# Patient Record
Sex: Female | Born: 1960 | Race: White | Hispanic: No | Marital: Married | Smoking: Never smoker
Health system: Southern US, Community
[De-identification: ages and names within clinical notes are randomized; demographics above are authoritative.]

## PROBLEM LIST (undated history)

## (undated) DIAGNOSIS — E785 Hyperlipidemia, unspecified: Secondary | ICD-10-CM

## (undated) DIAGNOSIS — F32A Depression, unspecified: Secondary | ICD-10-CM

## (undated) DIAGNOSIS — F329 Major depressive disorder, single episode, unspecified: Secondary | ICD-10-CM

## (undated) DIAGNOSIS — F419 Anxiety disorder, unspecified: Secondary | ICD-10-CM

## (undated) DIAGNOSIS — J45909 Unspecified asthma, uncomplicated: Secondary | ICD-10-CM

## (undated) DIAGNOSIS — E079 Disorder of thyroid, unspecified: Secondary | ICD-10-CM

## (undated) DIAGNOSIS — M8580 Other specified disorders of bone density and structure, unspecified site: Secondary | ICD-10-CM

## (undated) DIAGNOSIS — K219 Gastro-esophageal reflux disease without esophagitis: Secondary | ICD-10-CM

## (undated) HISTORY — DX: Depression, unspecified: F32.A

## (undated) HISTORY — DX: Disorder of thyroid, unspecified: E07.9

## (undated) HISTORY — DX: Hyperlipidemia, unspecified: E78.5

## (undated) HISTORY — PX: TUBAL LIGATION: SHX77

## (undated) HISTORY — DX: Anxiety disorder, unspecified: F41.9

## (undated) HISTORY — DX: Major depressive disorder, single episode, unspecified: F32.9

## (undated) HISTORY — DX: Other specified disorders of bone density and structure, unspecified site: M85.80

## (undated) HISTORY — DX: Gastro-esophageal reflux disease without esophagitis: K21.9

---

## 2009-04-27 ENCOUNTER — Ambulatory Visit: Payer: Self-pay | Admitting: Unknown Physician Specialty

## 2009-04-27 LAB — HM COLONOSCOPY

## 2012-11-11 ENCOUNTER — Ambulatory Visit: Payer: Self-pay

## 2014-02-06 LAB — HM PAP SMEAR

## 2014-03-29 ENCOUNTER — Ambulatory Visit: Payer: Self-pay

## 2014-04-20 ENCOUNTER — Ambulatory Visit: Payer: Self-pay

## 2014-04-20 LAB — HM MAMMOGRAPHY

## 2014-08-17 ENCOUNTER — Other Ambulatory Visit: Payer: Self-pay | Admitting: Unknown Physician Specialty

## 2014-08-18 ENCOUNTER — Other Ambulatory Visit: Payer: Self-pay

## 2014-08-18 MED ORDER — SIMVASTATIN 20 MG PO TABS: 20.0000 mg | ORAL_TABLET | Freq: Every day | ORAL | Status: DC

## 2014-08-18 NOTE — Telephone Encounter (Signed)
Patient was last seen on 05/03/14 with a f/u PE in 6 months. Practice partner number is [id number] and pharmacy is [facility].

## 2014-10-06 ENCOUNTER — Other Ambulatory Visit: Payer: Self-pay | Admitting: Unknown Physician Specialty

## 2014-10-06 NOTE — Telephone Encounter (Signed)
Needs seen further refills 

## 2014-11-06 ENCOUNTER — Encounter: Payer: Self-pay | Admitting: Unknown Physician Specialty

## 2014-11-06 ENCOUNTER — Ambulatory Visit (INDEPENDENT_AMBULATORY_CARE_PROVIDER_SITE_OTHER): Payer: 59 | Admitting: Unknown Physician Specialty

## 2014-11-06 VITALS — BP 118/80 | HR 73 | Temp 98.7°F | Ht <= 58 in | Wt 141.4 lb

## 2014-11-06 DIAGNOSIS — F419 Anxiety disorder, unspecified: Secondary | ICD-10-CM

## 2014-11-06 DIAGNOSIS — F418 Other specified anxiety disorders: Secondary | ICD-10-CM | POA: Diagnosis not present

## 2014-11-06 DIAGNOSIS — E039 Hypothyroidism, unspecified: Secondary | ICD-10-CM | POA: Insufficient documentation

## 2014-11-06 DIAGNOSIS — K219 Gastro-esophageal reflux disease without esophagitis: Secondary | ICD-10-CM

## 2014-11-06 DIAGNOSIS — I10 Essential (primary) hypertension: Secondary | ICD-10-CM

## 2014-11-06 DIAGNOSIS — E785 Hyperlipidemia, unspecified: Secondary | ICD-10-CM | POA: Diagnosis not present

## 2014-11-06 DIAGNOSIS — F329 Major depressive disorder, single episode, unspecified: Secondary | ICD-10-CM | POA: Insufficient documentation

## 2014-11-06 DIAGNOSIS — E669 Obesity, unspecified: Secondary | ICD-10-CM

## 2014-11-06 DIAGNOSIS — M8580 Other specified disorders of bone density and structure, unspecified site: Secondary | ICD-10-CM | POA: Insufficient documentation

## 2014-11-06 DIAGNOSIS — E1169 Type 2 diabetes mellitus with other specified complication: Secondary | ICD-10-CM | POA: Insufficient documentation

## 2014-11-06 DIAGNOSIS — F32A Depression, unspecified: Secondary | ICD-10-CM

## 2014-11-06 DIAGNOSIS — J45909 Unspecified asthma, uncomplicated: Secondary | ICD-10-CM

## 2014-11-06 LAB — LIPID PANEL PICCOLO, WAIVED
Chol/HDL Ratio Piccolo,Waive: 3.9 mg/dL
Cholesterol Piccolo, Waived: 226 mg/dL — ABNORMAL HIGH (ref <200)
HDL Chol Piccolo, Waived: 58 mg/dL (ref >59)
LDL Chol Calc Piccolo Waived: 129 mg/dL — ABNORMAL HIGH (ref <100)
Triglycerides Piccolo,Waived: 197 mg/dL — ABNORMAL HIGH (ref <150)
VLDL Chol Calc Piccolo,Waive: 39 mg/dL — ABNORMAL HIGH (ref <30)

## 2014-11-06 NOTE — Progress Notes (Signed)
   BP 118/80 mmHg  Pulse 73  Temp(Src) 98.7 F (37.1 C)  Ht 4' 9.6" (1.463 m)  Wt 141 lb 6.4 oz (64.139 kg)  BMI 29.97 kg/m2  SpO2 97%  LMP 06/14/2014 (Approximate)   Subjective:    Patient ID: Denise Proctor, female    DOB: 08/29/1960, 54 y.o.   MRN: [id number]  HPI: [name] is a 54 y.o. female  Chief Complaint  Patient presents with  . OTHER    pt has paperwork for job  . Medication Problem    pt states she thinks the citalopram is making her gain weight    Relevant past medical, surgical, family and social history reviewed and updated as indicated. Interim medical history since our last visit reviewed. Allergies and medications reviewed and updated.  Review of Systems  Per HPI unless specifically indicated above     Objective:    BP 118/80 mmHg  Pulse 73  Temp(Src) 98.7 F (37.1 C)  Ht 4' 9.6" (1.463 m)  Wt 141 lb 6.4 oz (64.139 kg)  BMI 29.97 kg/m2  SpO2 97%  LMP 06/14/2014 (Approximate)  Wt Readings from Last 3 Encounters:  11/06/14 141 lb 6.4 oz (64.139 kg)  05/03/14 140 lb (63.504 kg)    Physical Exam  Results for orders placed or performed in visit on 11/06/14  HM MAMMOGRAPHY  Result Value Ref Range   HM Mammogram from PP   HM PAP SMEAR  Result Value Ref Range   HM Pap smear from PP   HM COLONOSCOPY  Result Value Ref Range   HM Colonoscopy from PP       Assessment & Plan:   Problem List Items Addressed This Visit    None       Follow up plan: No Follow-up on file.

## 2014-11-06 NOTE — Progress Notes (Signed)
BP 118/80 mmHg  Pulse 73  Temp(Src) 98.7 F (37.1 C)  Ht 4' 9.6" (1.463 m)  Wt 141 lb 6.4 oz (64.139 kg)  BMI 29.97 kg/m2  SpO2 97%  LMP 06/14/2014 (Approximate)   Subjective:    Patient ID: Denise Proctor, female    DOB: [birth year], 54 y.o.   MRN: [id number]  HPI: Denise Proctor is a 54 y.o. female  Chief Complaint  Patient presents with  . OTHER    pt has paperwork for job  . Medication Problem    pt states she thinks the citalopram is making her gain weight    HYPERTENSION / HYPERLIPIDEMIA Patient is requesting refill(s). Satisfied with current treatment?  yes  H6  Duration of hypertension:  chronic  H4  BP monitoring frequency:  not checking.  H5  Past BP meds:  none  P1  Duration of hyperlipidemia:  chronic  H4  Cholesterol medication side effects:  no P1  Cholesterol supplements:  none  P1 Past cholesterol medications:  none  P1 Medication compliance:  excellent compliance  P1  Aspirin:  Aspirin Therapy  on 02/08/10 Recent stressors:  no  H6   Recurrent headaches:  no  R10 Visual changes:  no  R2  Palpitations:  no  R4  Dyspnea:  no  R5  Chest pain:  no  R4  Lower extremity edema:  no  R4  Dizzy/lightheaded:  no  R10   Depression Doing well.  Feels that Citalopram is causing weight gain.   Depression screen PHQ 2/9 11/06/2014  Decreased Interest 0  Down, Depressed, Hopeless 0  PHQ - 2 Score 0      Relevant past medical, surgical, family and social history reviewed and updated as indicated. Interim medical history since our last visit reviewed. Allergies and medications reviewed and updated.  Review of Systems  Per HPI unless specifically indicated above     Objective:    BP 118/80 mmHg  Pulse 73  Temp(Src) 98.7 F (37.1 C)  Ht 4' 9.6" (1.463 m)  Wt 141 lb 6.4 oz (64.139 kg)  BMI 29.97 kg/m2  SpO2 97%  LMP 06/14/2014 (Approximate)  Wt Readings from Last 3 Encounters:  11/06/14 141 lb 6.4 oz (64.139 kg)  05/03/14 140 lb (63.504 kg)     Physical Exam  Constitutional: She is oriented to person, place, and time. She appears well-developed and well-nourished. No distress.  HENT:  Head: Normocephalic and atraumatic.  Eyes: Conjunctivae and lids are normal. Right eye exhibits no discharge. Left eye exhibits no discharge. No scleral icterus.  Cardiovascular: Normal rate, regular rhythm and normal heart sounds.   Pulmonary/Chest: Effort normal and breath sounds normal. No respiratory distress.  Abdominal: Normal appearance. There is no splenomegaly or hepatomegaly.  Musculoskeletal: Normal range of motion.  Neurological: She is alert and oriented to person, place, and time.  Skin: Skin is intact. No rash noted. No pallor.  Psychiatric: She has a normal mood and affect. Her behavior is normal. Judgment and thought content normal.    Results for orders placed or performed in visit on 11/06/14  HM MAMMOGRAPHY  Result Value Ref Range   HM Mammogram from PP   HM PAP SMEAR  Result Value Ref Range   HM Pap smear from PP   HM COLONOSCOPY  Result Value Ref Range   HM Colonoscopy from PP       Assessment & Plan:   Problem List Items Addressed This Visit  Unprioritized   Anxiety and depression    Discuss weaning off of Citalopram      Hyperlipidemia    Check lipid panel      Relevant Orders   Comprehensive metabolic panel   Lipid Panel Piccolo, Waived   Hypertension - Primary    Stable, continue present medications.       Relevant Orders   Comprehensive metabolic panel   Obesity    Form from [facility].  Recommend losing 5% of body weight (7 pounds) in 6 months.  Discussed diet and exercise          Follow up plan: Return for physical in January.

## 2014-11-06 NOTE — Assessment & Plan Note (Signed)
Discuss weaning off of Citalopram

## 2014-11-06 NOTE — Assessment & Plan Note (Addendum)
Form from [facility].  Recommend losing 5% of body weight (7 pounds) in 6 months.  Discussed diet and exercise

## 2014-11-06 NOTE — Assessment & Plan Note (Signed)
Stable, continue present medications.   

## 2014-11-06 NOTE — Assessment & Plan Note (Signed)
Check lipid panel  

## 2014-11-07 LAB — COMPREHENSIVE METABOLIC PANEL
ALT: 21 IU/L (ref 0–32)
AST: 19 IU/L (ref 0–40)
Albumin/Globulin Ratio: 1.4 (ref 1.1–2.5)
Albumin: 4.3 g/dL (ref 3.5–5.5)
Alkaline Phosphatase: 51 IU/L (ref 39–117)
BUN/Creatinine Ratio: 12 (ref 9–23)
BUN: 8 mg/dL (ref 6–24)
Bilirubin Total: 0.6 mg/dL (ref 0.0–1.2)
CO2: 26 mmol/L (ref 18–29)
Calcium: 9.5 mg/dL (ref 8.7–10.2)
Chloride: 98 mmol/L (ref 97–108)
Creatinine, Ser: 0.69 mg/dL (ref 0.57–1.00)
GFR calc Af Amer: 113 mL/min/{1.73_m2} (ref >59)
GFR calc non Af Amer: 98 mL/min/{1.73_m2} (ref >59)
Globulin, Total: 3.1 g/dL (ref 1.5–4.5)
Glucose: 86 mg/dL (ref 65–99)
Potassium: 4.5 mmol/L (ref 3.5–5.2)
Sodium: 140 mmol/L (ref 134–144)
Total Protein: 7.4 g/dL (ref 6.0–8.5)

## 2015-01-14 ENCOUNTER — Other Ambulatory Visit: Payer: Self-pay | Admitting: Unknown Physician Specialty

## 2015-04-11 ENCOUNTER — Other Ambulatory Visit: Payer: Self-pay | Admitting: Unknown Physician Specialty

## 2015-06-06 ENCOUNTER — Other Ambulatory Visit: Payer: Self-pay | Admitting: Unknown Physician Specialty

## 2015-06-07 NOTE — Telephone Encounter (Signed)
Called and left patient a voicemail asking for her to please return my call.  

## 2015-06-07 NOTE — Telephone Encounter (Signed)
Needs labs further refills 

## 2015-06-08 NOTE — Telephone Encounter (Signed)
Patient returned my call when I was out of the office yesterday and asked to be called back at [phone]. So I called the patient back and scheduled a physical for 06/29/15.

## 2015-06-29 ENCOUNTER — Encounter: Payer: Self-pay | Admitting: Unknown Physician Specialty

## 2015-06-29 ENCOUNTER — Ambulatory Visit (INDEPENDENT_AMBULATORY_CARE_PROVIDER_SITE_OTHER): Payer: Managed Care, Other (non HMO) | Admitting: Unknown Physician Specialty

## 2015-06-29 VITALS — BP 118/76 | HR 79 | Temp 98.5°F | Ht <= 58 in | Wt 140.0 lb

## 2015-06-29 DIAGNOSIS — F329 Major depressive disorder, single episode, unspecified: Secondary | ICD-10-CM

## 2015-06-29 DIAGNOSIS — J309 Allergic rhinitis, unspecified: Secondary | ICD-10-CM | POA: Diagnosis not present

## 2015-06-29 DIAGNOSIS — N952 Postmenopausal atrophic vaginitis: Secondary | ICD-10-CM

## 2015-06-29 DIAGNOSIS — K219 Gastro-esophageal reflux disease without esophagitis: Secondary | ICD-10-CM | POA: Diagnosis not present

## 2015-06-29 DIAGNOSIS — J45909 Unspecified asthma, uncomplicated: Secondary | ICD-10-CM

## 2015-06-29 DIAGNOSIS — E785 Hyperlipidemia, unspecified: Secondary | ICD-10-CM | POA: Diagnosis not present

## 2015-06-29 DIAGNOSIS — E039 Hypothyroidism, unspecified: Secondary | ICD-10-CM | POA: Diagnosis not present

## 2015-06-29 DIAGNOSIS — Z0000 Encounter for general adult medical examination without abnormal findings: Secondary | ICD-10-CM

## 2015-06-29 DIAGNOSIS — F418 Other specified anxiety disorders: Secondary | ICD-10-CM

## 2015-06-29 DIAGNOSIS — F32A Depression, unspecified: Secondary | ICD-10-CM

## 2015-06-29 DIAGNOSIS — F419 Anxiety disorder, unspecified: Secondary | ICD-10-CM

## 2015-06-29 MED ORDER — OMEPRAZOLE 20 MG PO CPDR: 20.0000 mg | DELAYED_RELEASE_CAPSULE | Freq: Every day | ORAL | Status: DC

## 2015-06-29 MED ORDER — MONTELUKAST SODIUM 10 MG PO TABS: 10.0000 mg | ORAL_TABLET | Freq: Every day | ORAL | Status: DC

## 2015-06-29 MED ORDER — CITALOPRAM HYDROBROMIDE 20 MG PO TABS: 20.0000 mg | ORAL_TABLET | Freq: Every day | ORAL | Status: DC

## 2015-06-29 MED ORDER — LEVOTHYROXINE SODIUM 50 MCG PO TABS: 50.0000 ug | ORAL_TABLET | Freq: Every day | ORAL | Status: DC

## 2015-06-29 MED ORDER — SIMVASTATIN 20 MG PO TABS: 20.0000 mg | ORAL_TABLET | Freq: Every day | ORAL | Status: DC

## 2015-06-29 MED ORDER — ALBUTEROL SULFATE HFA 108 (90 BASE) MCG/ACT IN AERS: 2.0000 | INHALATION_SPRAY | Freq: Four times a day (QID) | RESPIRATORY_TRACT | Status: DC | PRN

## 2015-06-29 NOTE — Assessment & Plan Note (Signed)
Add albuterol prn.  

## 2015-06-29 NOTE — Assessment & Plan Note (Signed)
Discussed using Replense

## 2015-06-29 NOTE — Assessment & Plan Note (Signed)
Check cholesterol.

## 2015-06-29 NOTE — Assessment & Plan Note (Signed)
Restart Synthroid

## 2015-06-29 NOTE — Progress Notes (Signed)
BP 118/76 mmHg  Pulse 79  Temp(Src) 98.5 F (36.9 C)  Ht  (1.448 m)  Wt 140 lb (63.504 kg)  BMI 30.29 kg/m2  SpO2 95%  LMP 01/16/2015 (Approximate)   Subjective:    Patient ID: Denise Proctor, female    DOB: Sep 10, 1960, 55 y.o.   MRN: [id number]  HPI: Denise Proctor is a 55 y.o. female  Chief Complaint  Patient presents with  . Annual Exam    pt states she would like vitamin D checked  . Medication Problem    pt states her symbitcort is not lasting long enough  . Labs Only    HIV and Hep C order entered   ASTHMA Takes Symbicort twice a day but finds she needs something in the middle of the day.  Does not have a rescue inhaler.  States she has phlegm "all the time" Night time symptoms: none Wheeze/SOB: yes ER visits since last visit:none Missed work or school:none Allergens:nothing known  Hypothyroid Ran out of meds for 2 weeks and hasn't had time to get refills   Hyperlipidemia Using medications without problems: No Muscle aches  Diet compliance: good Exercise: walking  Depression Depression screen [facility] 2/9 06/29/2015 11/06/2014  Decreased Interest 0 0  Down, Depressed, Hopeless 0 0  PHQ - 2 Score 0 0      Social History   Social History  . Marital Status: Married    Spouse Name: N/A  . Number of Children: N/A  . Years of Education: N/A   Occupational History  . Not on file.   Social History Main Topics  . Smoking status: Never Smoker   . Smokeless tobacco: Never Used  . Alcohol Use: No  . Drug Use: No  . Sexual Activity: Yes   Other Topics Concern  . Not on file   Social History Narrative   Family History  Problem Relation Age of Onset  . Hypertension Mother   . Hyperlipidemia Mother   . COPD Mother   . Heart disease Father   . Diabetes Maternal Grandmother   . Cancer Paternal Grandmother     stomach  . Stroke Paternal Grandfather   . Heart disease Paternal Grandfather   . Hyperlipidemia Brother   . Diverticulitis Brother   .  Heart disease Maternal Grandfather   . Diverticulitis Brother    Past Medical History  Diagnosis Date  . GERD (gastroesophageal reflux disease)   . Thyroid disease   . Osteopenia   . Depression   . Anxiety   . Hyperlipidemia    Past Surgical History  Procedure Laterality Date  . Tubal ligation        Relevant past medical, surgical, family and social history reviewed and updated as indicated. Interim medical history since our last visit reviewed. Allergies and medications reviewed and updated.  Review of Systems  Constitutional: Negative.   HENT: Negative.   Eyes: Negative.   Respiratory: Negative.   Cardiovascular: Negative.   Gastrointestinal: Negative.   Endocrine: Negative.   Genitourinary:       Vaginal dryness  Musculoskeletal: Negative.   Skin: Negative.   Allergic/Immunologic: Negative.   Neurological: Negative.   Hematological: Negative.   Psychiatric/Behavioral: Negative.     Per HPI unless specifically indicated above     Objective:    BP 118/76 mmHg  Pulse 79  Temp(Src) 98.5 F (36.9 C)  Ht  (1.448 m)  Wt 140 lb (63.504 kg)  BMI 30.29 kg/m2  SpO2  95%  LMP 01/16/2015 (Approximate)  Wt Readings from Last 3 Encounters:  06/29/15 140 lb (63.504 kg)  11/06/14 141 lb 6.4 oz (64.139 kg)  05/03/14 140 lb (63.504 kg)    Physical Exam  Constitutional: She is oriented to person, place, and time. She appears well-developed and well-nourished.  HENT:  Head: Normocephalic and atraumatic.  Eyes: Pupils are equal, round, and reactive to light. Right eye exhibits no discharge. Left eye exhibits no discharge. No scleral icterus.  Neck: Normal range of motion. Neck supple. Carotid bruit is not present. No thyromegaly present.  Cardiovascular: Normal rate, regular rhythm and normal heart sounds.  Exam reveals no gallop and no friction rub.   No murmur heard. Pulmonary/Chest: Effort normal and breath sounds normal. No respiratory distress. She has no  wheezes. She has no rales.  Abdominal: Soft. Bowel sounds are normal. There is no tenderness. There is no rebound.  Genitourinary: No breast swelling, tenderness or discharge.  Musculoskeletal: Normal range of motion.  Lymphadenopathy:    She has no cervical adenopathy.  Neurological: She is alert and oriented to person, place, and time.  Skin: Skin is warm, dry and intact. No rash noted.  Psychiatric: She has a normal mood and affect. Her speech is normal and behavior is normal. Judgment and thought content normal. Cognition and memory are normal.    Results for orders placed or performed in visit on 11/06/14  Comprehensive metabolic panel  Result Value Ref Range   Glucose 86 65 - 99 mg/dL   BUN 8 6 - 24 mg/dL   Creatinine, Ser 3.08 0.57 - 1.00 mg/dL   GFR calc non Af Amer 98 >59 mL/min/1.73   GFR calc Af Amer 113 >59 mL/min/1.73   BUN/Creatinine Ratio 12 9 - 23   Sodium 140 134 - 144 mmol/L   Potassium 4.5 3.5 - 5.2 mmol/L   Chloride 98 97 - 108 mmol/L   CO2 26 18 - 29 mmol/L   Calcium 9.5 8.7 - 10.2 mg/dL   Total Protein 7.4 6.0 - 8.5 g/dL   Albumin 4.3 3.5 - 5.5 g/dL   Globulin, Total 3.1 1.5 - 4.5 g/dL   Albumin/Globulin Ratio 1.4 1.1 - 2.5   Bilirubin Total 0.6 0.0 - 1.2 mg/dL   Alkaline Phosphatase 51 39 - 117 IU/L   AST 19 0 - 40 IU/L   ALT 21 0 - 32 IU/L  Lipid Panel Piccolo, Waived  Result Value Ref Range   Cholesterol Piccolo, Waived 226 (H) <200 mg/dL   HDL Chol Piccolo, Waived 58 >59 mg/dL   Triglycerides Piccolo,Waived 197 (H) <150 mg/dL   Chol/HDL Ratio Piccolo,Waive 3.9 mg/dL   LDL Chol Calc Piccolo Waived 129 (H) <100 mg/dL   VLDL Chol Calc Piccolo,Waive 39 (H) <30 mg/dL      Assessment & Plan:   Problem List Items Addressed This Visit      Unprioritized   Allergic rhinitis    Add singulair due to Asthma      Anxiety and depression    Stable, continue present medications.        Asthma    Add albuterol prn      Relevant Medications    albuterol (PROVENTIL HFA;VENTOLIN HFA) 108 (90 Base) MCG/ACT inhaler   montelukast (SINGULAIR) 10 MG tablet   Atrophic vaginitis    Discussed using Replense      GERD (gastroesophageal reflux disease)    stable      Relevant Medications   omeprazole (PRILOSEC) 20  MG capsule   Hyperlipidemia    Check cholesterol      Relevant Medications   simvastatin (ZOCOR) 20 MG tablet   Other Relevant Orders   Lipid Panel w/o Chol/HDL Ratio   Hypothyroidism    Restart Synthroid      Relevant Medications   levothyroxine (SYNTHROID, LEVOTHROID) 50 MCG tablet   Other Relevant Orders   TSH    Other Visit Diagnoses    Health care maintenance    -  Primary    Relevant Orders    Hepatitis C antibody    HIV antibody    Hepatitis C antibody    HIV antibody    CBC with Differential/Platelet    VITAMIN D 25 Hydroxy (Vit-D Deficiency, Fractures)        Follow up plan: Return in about 6 months (around 12/30/2015).

## 2015-06-29 NOTE — Assessment & Plan Note (Signed)
Add singulair due to Asthma

## 2015-06-29 NOTE — Assessment & Plan Note (Signed)
stable °

## 2015-06-29 NOTE — Assessment & Plan Note (Signed)
Stable, continue present medications.   

## 2015-06-30 LAB — TSH: TSH: 10.28 u[IU]/mL — ABNORMAL HIGH (ref 0.450–4.500)

## 2015-06-30 LAB — CBC WITH DIFFERENTIAL/PLATELET
Basophils Absolute: 0 10*3/uL (ref 0.0–0.2)
Basos: 0 %
EOS (ABSOLUTE): 0.1 10*3/uL (ref 0.0–0.4)
Eos: 2 %
Hematocrit: 40.9 % (ref 34.0–46.6)
Hemoglobin: 13.5 g/dL (ref 11.1–15.9)
Immature Grans (Abs): 0 10*3/uL (ref 0.0–0.1)
Immature Granulocytes: 0 %
Lymphocytes Absolute: 1.7 10*3/uL (ref 0.7–3.1)
Lymphs: 29 %
MCH: 29.8 pg (ref 26.6–33.0)
MCHC: 33 g/dL (ref 31.5–35.7)
MCV: 90 fL (ref 79–97)
Monocytes Absolute: 0.5 10*3/uL (ref 0.1–0.9)
Monocytes: 9 %
Neutrophils Absolute: 3.4 10*3/uL (ref 1.4–7.0)
Neutrophils: 60 %
Platelets: 429 10*3/uL — ABNORMAL HIGH (ref 150–379)
RBC: 4.53 x10E6/uL (ref 3.77–5.28)
RDW: 14.7 % (ref 12.3–15.4)
WBC: 5.7 10*3/uL (ref 3.4–10.8)

## 2015-06-30 LAB — LIPID PANEL W/O CHOL/HDL RATIO
Cholesterol, Total: 200 mg/dL — ABNORMAL HIGH (ref 100–199)
HDL: 54 mg/dL (ref >39)
LDL Calculated: 124 mg/dL — ABNORMAL HIGH (ref 0–99)
Triglycerides: 108 mg/dL (ref 0–149)
VLDL Cholesterol Cal: 22 mg/dL (ref 5–40)

## 2015-06-30 LAB — HEPATITIS C ANTIBODY: Hep C Virus Ab: <0.1 s/co ratio (ref 0.0–0.9)

## 2015-06-30 LAB — HIV ANTIBODY (ROUTINE TESTING W REFLEX): HIV Screen 4th Generation wRfx: NONREACTIVE

## 2015-06-30 LAB — VITAMIN D 25 HYDROXY (VIT D DEFICIENCY, FRACTURES): Vit D, 25-Hydroxy: 15 ng/mL — ABNORMAL LOW (ref 30.0–100.0)

## 2015-07-03 ENCOUNTER — Encounter: Payer: Self-pay | Admitting: Unknown Physician Specialty

## 2015-08-20 ENCOUNTER — Other Ambulatory Visit: Payer: Self-pay | Admitting: Unknown Physician Specialty

## 2015-09-25 ENCOUNTER — Other Ambulatory Visit: Payer: Self-pay | Admitting: Unknown Physician Specialty

## 2015-09-25 NOTE — Telephone Encounter (Signed)
Your patient 

## 2015-10-19 ENCOUNTER — Encounter: Payer: Self-pay | Admitting: Unknown Physician Specialty

## 2015-10-19 ENCOUNTER — Ambulatory Visit (INDEPENDENT_AMBULATORY_CARE_PROVIDER_SITE_OTHER): Payer: Managed Care, Other (non HMO) | Admitting: Unknown Physician Specialty

## 2015-10-19 VITALS — BP 129/82 | HR 88 | Temp 98.7°F | Ht <= 58 in | Wt 139.6 lb

## 2015-10-19 DIAGNOSIS — E039 Hypothyroidism, unspecified: Secondary | ICD-10-CM

## 2015-10-19 DIAGNOSIS — J309 Allergic rhinitis, unspecified: Secondary | ICD-10-CM

## 2015-10-19 DIAGNOSIS — E559 Vitamin D deficiency, unspecified: Secondary | ICD-10-CM

## 2015-10-19 MED ORDER — BUDESONIDE-FORMOTEROL FUMARATE 160-4.5 MCG/ACT IN AERO: 2.0000 | INHALATION_SPRAY | Freq: Two times a day (BID) | RESPIRATORY_TRACT | 3 refills | Status: DC

## 2015-10-19 MED ORDER — MONTELUKAST SODIUM 10 MG PO TABS: 10.0000 mg | ORAL_TABLET | Freq: Every day | ORAL | 3 refills | Status: DC

## 2015-10-19 MED ORDER — LEVOTHYROXINE SODIUM 50 MCG PO TABS: 50.0000 ug | ORAL_TABLET | Freq: Every day | ORAL | 0 refills | Status: DC

## 2015-10-19 NOTE — Progress Notes (Signed)
BP 129/82 (BP Location: Left Arm, Patient Position: Sitting, Cuff Size: Normal)   Pulse 88   Temp 98.7 F (37.1 C)   Ht 4' 9.8" (1.468 m)   Wt 139 lb 9.6 oz (63.3 kg)   LMP 01/18/2015 (Approximate)   SpO2 98%   BMI 29.38 kg/m    Subjective:    Patient ID: Denise Proctor, female    DOB: 1960-08-03, 55 y.o.   MRN: [id number]  HPI: Denise Proctor is a 55 y.o. female  Chief Complaint  Patient presents with  . Depression  . Hyperlipidemia  . Hypothyroidism  . Medication Refill    pt states she [facility]  . Cough    pt states she has keeps getting phlegm up. States this has been going on for about 6 months     Hypothyroid Follow up today for repeat TSH, which was elevated at last visit due to running out of synthroid.   Low Vitamin D Also re-checking Vit-D level, patient has been taking vitamin D supplementation since last visit. Unclear on the amount  Allergic rhinnitis Started on singular in May, reports phlegm had gotten somewhat better until she was unable to get singular refilled. Will re-order this today.     Relevant past medical, surgical, family and social history reviewed and updated as indicated. Interim medical history since our last visit reviewed. Allergies and medications reviewed and updated.  Review of Systems  Constitutional: Negative for fatigue and unexpected weight change.  Respiratory: Positive for cough.   Endocrine: Negative for cold intolerance.    Per HPI unless specifically indicated above     Objective:    BP 129/82 (BP Location: Left Arm, Patient Position: Sitting, Cuff Size: Normal)   Pulse 88   Temp 98.7 F (37.1 C)   Ht 4' 9.8" (1.468 m)   Wt 139 lb 9.6 oz (63.3 kg)   LMP 01/18/2015 (Approximate)   SpO2 98%   BMI 29.38 kg/m   Wt Readings from Last 3 Encounters:  10/19/15 139 lb 9.6 oz (63.3 kg)  06/29/15 140 lb (63.5 kg)  11/06/14 141 lb 6.4 oz (64.1 kg)    Physical Exam  Constitutional:  She appears well-developed and well-nourished. No distress.  HENT:  Head: Normocephalic.  Nose: Mucosal edema present.  Mouth/Throat: Oropharynx is clear and moist.  Cardiovascular: Normal rate, regular rhythm and normal heart sounds.   Pulmonary/Chest: Effort normal and breath sounds normal. No respiratory distress.  Skin: Skin is warm and dry.  Psychiatric: She has a normal mood and affect. Her behavior is normal. Judgment and thought content normal.    Results for orders placed or performed in visit on 06/29/15  Hepatitis C antibody  Result Value Ref Range   Hep C Virus Ab <0.1 0.0 - 0.9 s/co ratio  HIV antibody  Result Value Ref Range   HIV Screen 4th Generation wRfx Non Reactive Non Reactive  Lipid Panel w/o Chol/HDL Ratio  Result Value Ref Range   Cholesterol, Total 200 (H) 100 - 199 mg/dL   Triglycerides 409108 0 - 149 mg/dL   HDL 54 >81>39 mg/dL   VLDL Cholesterol Cal 22 5 - 40 mg/dL   LDL Calculated 191124 (H) 0 - 99 mg/dL  TSH  Result Value Ref Range   TSH 10.280 (H) 0.450 - 4.500 uIU/mL  CBC with Differential/Platelet  Result Value Ref Range   WBC 5.7 3.4 - 10.8 x10E3/uL   RBC 4.53 3.77 -  5.28 x10E6/uL   Hemoglobin 13.5 11.1 - 15.9 g/dL   Hematocrit 16.1 09.6 - 46.6 %   MCV 90 79 - 97 fL   MCH 29.8 26.6 - 33.0 pg   MCHC 33.0 31.5 - 35.7 g/dL   RDW 04.5 40.9 - 81.1 %   Platelets 429 (H) 150 - 379 x10E3/uL   Neutrophils 60 %   Lymphs 29 %   Monocytes 9 %   Eos 2 %   Basos 0 %   Neutrophils Absolute 3.4 1.4 - 7.0 x10E3/uL   Lymphocytes Absolute 1.7 0.7 - 3.1 x10E3/uL   Monocytes Absolute 0.5 0.1 - 0.9 x10E3/uL   EOS (ABSOLUTE) 0.1 0.0 - 0.4 x10E3/uL   Basophils Absolute 0.0 0.0 - 0.2 x10E3/uL   Immature Granulocytes 0 %   Immature Grans (Abs) 0.0 0.0 - 0.1 x10E3/uL  VITAMIN D 25 Hydroxy (Vit-D Deficiency, Fractures)  Result Value Ref Range   Vit D, 25-Hydroxy 15.0 (L) 30.0 - 100.0 ng/mL      Assessment & Plan:   Problem List Items Addressed This Visit       Respiratory   Allergic rhinitis    Restart Singulair        Endocrine   Hypothyroidism - Primary   Relevant Medications   levothyroxine (SYNTHROID, LEVOTHROID) 50 MCG tablet   Other Relevant Orders   TSH    Other Visit Diagnoses    Vitamin D deficiency       Relevant Orders   VITAMIN D 25 Hydroxy (Vit-D Deficiency, Fractures)       Follow up plan: Will re-check TSH and Vit D levels today, call with results next week.  Re-ordered singular and suggested OTC flonase for further symptom management.  Follow up for physical in 6 months.

## 2015-10-19 NOTE — Assessment & Plan Note (Signed)
Restart Singulair. 

## 2015-10-20 LAB — VITAMIN D 25 HYDROXY (VIT D DEFICIENCY, FRACTURES): Vit D, 25-Hydroxy: 24.4 ng/mL — ABNORMAL LOW (ref 30.0–100.0)

## 2015-10-20 LAB — TSH: TSH: 4.2 u[IU]/mL (ref 0.450–4.500)

## 2015-12-02 ENCOUNTER — Other Ambulatory Visit: Payer: Self-pay | Admitting: Unknown Physician Specialty

## 2015-12-15 ENCOUNTER — Other Ambulatory Visit: Payer: Self-pay | Admitting: Unknown Physician Specialty

## 2016-01-03 ENCOUNTER — Other Ambulatory Visit: Payer: Self-pay | Admitting: Unknown Physician Specialty

## 2016-01-03 NOTE — Telephone Encounter (Signed)
[facility]. Pt stated she is out of this medication. [facility] [place] Thanks.

## 2016-01-03 NOTE — Telephone Encounter (Signed)
  Spoke with pharmacy, patient's medication has to go through mail order.  Elnita Maxwellheryl, Can you please send patients medication to mail order.   [facility]

## 2016-01-04 MED ORDER — LEVOTHYROXINE SODIUM 50 MCG PO TABS: 50.0000 ug | ORAL_TABLET | Freq: Every day | ORAL | 0 refills | Status: DC

## 2016-03-01 ENCOUNTER — Other Ambulatory Visit: Payer: Self-pay | Admitting: Unknown Physician Specialty

## 2016-04-11 ENCOUNTER — Encounter: Payer: Self-pay | Admitting: Unknown Physician Specialty

## 2016-04-11 ENCOUNTER — Ambulatory Visit (INDEPENDENT_AMBULATORY_CARE_PROVIDER_SITE_OTHER): Payer: Managed Care, Other (non HMO) | Admitting: Unknown Physician Specialty

## 2016-04-11 DIAGNOSIS — J3081 Allergic rhinitis due to animal (cat) (dog) hair and dander: Secondary | ICD-10-CM | POA: Diagnosis not present

## 2016-04-11 DIAGNOSIS — J45909 Unspecified asthma, uncomplicated: Secondary | ICD-10-CM | POA: Diagnosis not present

## 2016-04-11 DIAGNOSIS — E782 Mixed hyperlipidemia: Secondary | ICD-10-CM | POA: Diagnosis not present

## 2016-04-11 DIAGNOSIS — E039 Hypothyroidism, unspecified: Secondary | ICD-10-CM

## 2016-04-11 MED ORDER — LEVOTHYROXINE SODIUM 50 MCG PO TABS: 50.0000 ug | ORAL_TABLET | Freq: Every day | ORAL | 1 refills | Status: DC

## 2016-04-11 MED ORDER — MONTELUKAST SODIUM 10 MG PO TABS: 10.0000 mg | ORAL_TABLET | Freq: Every day | ORAL | 3 refills | Status: DC

## 2016-04-11 MED ORDER — SIMVASTATIN 20 MG PO TABS: 20.0000 mg | ORAL_TABLET | Freq: Every day | ORAL | 1 refills | Status: DC

## 2016-04-11 MED ORDER — ALBUTEROL SULFATE HFA 108 (90 BASE) MCG/ACT IN AERS: 2.0000 | INHALATION_SPRAY | Freq: Four times a day (QID) | RESPIRATORY_TRACT | 0 refills | Status: DC | PRN

## 2016-04-11 MED ORDER — OMEPRAZOLE 20 MG PO CPDR: 20.0000 mg | DELAYED_RELEASE_CAPSULE | Freq: Every day | ORAL | 3 refills | Status: DC

## 2016-04-11 MED ORDER — BUDESONIDE-FORMOTEROL FUMARATE 160-4.5 MCG/ACT IN AERO: 2.0000 | INHALATION_SPRAY | Freq: Two times a day (BID) | RESPIRATORY_TRACT | 3 refills | Status: DC

## 2016-04-11 NOTE — Assessment & Plan Note (Signed)
Euthyroid.  Work on diet and exercise for weight loss

## 2016-04-11 NOTE — Assessment & Plan Note (Signed)
Restart Singulair. 

## 2016-04-11 NOTE — Progress Notes (Signed)
BP 114/75 (BP Location: Left Arm, Patient Position: Sitting, Cuff Size: Normal)   Pulse 92   Temp 98.8 F (37.1 C)   Wt 139 lb (63 kg)   LMP 01/18/2015 (Approximate)   SpO2 98%   BMI 29.25 kg/m    Subjective:    Patient ID: Denise Proctor, female    DOB: Sep 04, 1960, 56 y.o.   MRN: [id number]  HPI: Denise Proctor is a 56 y.o. female  Chief Complaint  Patient presents with  . Medication Refill    pt states she is here to have medications refilled    Pt is here for medication refills  ASTHMA Never got Singulair Triggers: allergies Symptoms are well controlled: but has phlegm Using medications without problems Night time symptoms: coughing phlegnm Wheeze/SOB:none Missed work or school:none Allergens: ? Cat.  Planning on moving without the cat.    Hypothyroid Last labs were euthyroid.    Hyperlipidemia Not fasting.    Relevant past medical, surgical, family and social history reviewed and updated as indicated. Interim medical history since our last visit reviewed. Allergies and medications reviewed and updated.  Review of Systems  Per HPI unless specifically indicated above     Objective:    BP 114/75 (BP Location: Left Arm, Patient Position: Sitting, Cuff Size: Normal)   Pulse 92   Temp 98.8 F (37.1 C)   Wt 139 lb (63 kg)   LMP 01/18/2015 (Approximate)   SpO2 98%   BMI 29.25 kg/m   Wt Readings from Last 3 Encounters:  04/11/16 139 lb (63 kg)  10/19/15 139 lb 9.6 oz (63.3 kg)  06/29/15 140 lb (63.5 kg)    Physical Exam  Constitutional: She is oriented to person, place, and time. She appears well-developed and well-nourished. No distress.  HENT:  Head: Normocephalic and atraumatic.  Eyes: Conjunctivae and lids are normal. Right eye exhibits no discharge. Left eye exhibits no discharge. No scleral icterus.  Neck: Normal range of motion. Neck supple. No JVD present. Carotid bruit is not present.  Cardiovascular: Normal rate, regular rhythm and normal  heart sounds.   Pulmonary/Chest: Effort normal and breath sounds normal.  Abdominal: Normal appearance. There is no splenomegaly or hepatomegaly.  Musculoskeletal: Normal range of motion.  Neurological: She is alert and oriented to person, place, and time.  Skin: Skin is warm, dry and intact. No rash noted. No pallor.  Psychiatric: She has a normal mood and affect. Her behavior is normal. Judgment and thought content normal.    Results for orders placed or performed in visit on 10/19/15  TSH  Result Value Ref Range   TSH 4.200 0.450 - 4.500 uIU/mL  VITAMIN D 25 Hydroxy (Vit-D Deficiency, Fractures)  Result Value Ref Range   Vit D, 25-Hydroxy 24.4 (L) 30.0 - 100.0 ng/mL      Assessment & Plan:   Problem List Items Addressed This Visit      Unprioritized   Allergic rhinitis    Restart singulair and is working on moving away from her pets      Asthma    Restart Singulair      Relevant Medications   montelukast (SINGULAIR) 10 MG tablet   montelukast (SINGULAIR) 10 MG tablet   budesonide-formoterol (SYMBICORT) 160-4.5 MCG/ACT inhaler   albuterol (PROVENTIL HFA;VENTOLIN HFA) 108 (90 Base) MCG/ACT inhaler   Hyperlipidemia    Check at physical.  Refill Zocor      Relevant Medications   simvastatin (ZOCOR) 20 MG tablet   Hypothyroidism  Euthyroid.  Work on diet and exercise for weight loss      Relevant Medications   levothyroxine (SYNTHROID, LEVOTHROID) 50 MCG tablet       Follow up plan: Return for May for physical.

## 2016-04-11 NOTE — Assessment & Plan Note (Signed)
Restart singulair and is working on moving away from her pets

## 2016-04-11 NOTE — Assessment & Plan Note (Signed)
Check at physical.  Refill Zocor

## 2016-07-02 ENCOUNTER — Ambulatory Visit (INDEPENDENT_AMBULATORY_CARE_PROVIDER_SITE_OTHER): Payer: Managed Care, Other (non HMO) | Admitting: Unknown Physician Specialty

## 2016-07-02 ENCOUNTER — Encounter: Payer: Self-pay | Admitting: Unknown Physician Specialty

## 2016-07-02 VITALS — BP 118/79 | HR 93 | Temp 99.2°F | Ht <= 58 in | Wt 135.6 lb

## 2016-07-02 DIAGNOSIS — Z0000 Encounter for general adult medical examination without abnormal findings: Secondary | ICD-10-CM

## 2016-07-02 DIAGNOSIS — E782 Mixed hyperlipidemia: Secondary | ICD-10-CM

## 2016-07-02 DIAGNOSIS — K219 Gastro-esophageal reflux disease without esophagitis: Secondary | ICD-10-CM

## 2016-07-02 DIAGNOSIS — E039 Hypothyroidism, unspecified: Secondary | ICD-10-CM | POA: Diagnosis not present

## 2016-07-02 DIAGNOSIS — J45909 Unspecified asthma, uncomplicated: Secondary | ICD-10-CM | POA: Diagnosis not present

## 2016-07-02 DIAGNOSIS — E559 Vitamin D deficiency, unspecified: Secondary | ICD-10-CM | POA: Insufficient documentation

## 2016-07-02 DIAGNOSIS — J3081 Allergic rhinitis due to animal (cat) (dog) hair and dander: Secondary | ICD-10-CM

## 2016-07-02 NOTE — Assessment & Plan Note (Signed)
Continue with cough.  Observed inhaler technique and corrected some deficiencies

## 2016-07-02 NOTE — Assessment & Plan Note (Signed)
Taking Vit D supplement.  

## 2016-07-02 NOTE — Assessment & Plan Note (Signed)
Recommended adding Zyrtec to current treatment

## 2016-07-02 NOTE — Assessment & Plan Note (Signed)
Check Lipid panel and liver enzymes.  Continue with simvastatin

## 2016-07-02 NOTE — Assessment & Plan Note (Signed)
Check TSH 

## 2016-07-02 NOTE — Addendum Note (Signed)
Addended by: Gabriel CirriWICKER, Braelin Costlow on: 07/02/2016 09:22 AM   Modules accepted: Orders

## 2016-07-02 NOTE — Patient Instructions (Addendum)

## 2016-07-02 NOTE — Assessment & Plan Note (Signed)
Take Omeprazole daily.  Hopefully this will help asthma

## 2016-07-02 NOTE — Progress Notes (Signed)
BP 118/79   Pulse 93   Temp 99.2 F (37.3 C)   Ht 4' 9.3" (1.455 m)   Wt 135 lb 9.6 oz (61.5 kg)   LMP 01/18/2015 (Approximate)   SpO2 96%   BMI 29.04 kg/m    Subjective:    Patient ID: Denise Proctor, female    DOB: 11-03-1960, 56 y.o.   MRN: [id number]  HPI: Denise SportsmanConnie S Proctor is a 56 y.o. female  Chief Complaint  Patient presents with  . Annual Exam  . Gastroesophageal Reflux    pt states that for the last 6 months she would have a piece of food to come back up about 20 to 30 minutes after having something to eat    Gastric reflux Pt states food comes up after eating.  Sometimes if feels like the food is stuck.    Asthma Still "coughing my head off."  Takes the Symbicort BID as directed.  Takes Albuterol daily.  Taking Singulair.   Hypercholesterol Taking medication well.  No muscle aches  [facility] 07/02/2016 06/29/2015 11/06/2014  Decreased Interest 0 0 0  Down, Depressed, Hopeless 0 0 0  PHQ - 2 Score 0 0 0  Altered sleeping 1 - -  Tired, decreased energy 1 - -  Change in appetite 0 - -  Feeling bad or failure about yourself  0 - -  Trouble concentrating 0 - -  Moving slowly or fidgety/restless 0 - -  Suicidal thoughts 0 - -  PHQ-9 Score 2 - -   Social History   Social History  . Marital status: Married    Spouse name: N/A  . Number of children: N/A  . Years of education: N/A   Occupational History  . Not on file.   Social History Main Topics  . Smoking status: Never Smoker  . Smokeless tobacco: Never Used  . Alcohol use No  . Drug use: No  . Sexual activity: Yes   Other Topics Concern  . Not on file   Social History Narrative  . No narrative on file   Family History  Problem Relation Age of Onset  . Hypertension Mother   . Hyperlipidemia Mother   . COPD Mother   . Heart disease Father   . Diabetes Maternal Grandmother   . Cancer Paternal Grandmother        stomach  . Stroke Paternal Grandfather   . Heart disease Paternal  Grandfather   . Hyperlipidemia Brother   . Diverticulitis Brother   . Heart disease Maternal Grandfather   . Diverticulitis Brother       Relevant past medical, surgical, family and social history reviewed and updated as indicated. Interim medical history since our last visit reviewed. Allergies and medications reviewed and updated.  Review of Systems  Constitutional: Negative.   HENT: Positive for congestion and rhinorrhea.   Eyes: Negative.   Respiratory: Positive for shortness of breath.   Cardiovascular: Negative.   Endocrine: Negative.   Genitourinary: Negative.   Musculoskeletal: Negative.   Skin: Negative.   Allergic/Immunologic: Negative.   Neurological: Negative.   Psychiatric/Behavioral: Negative.    Per HPI unless specifically indicated above    Objective:    BP 118/79   Pulse 93   Temp 99.2 F (37.3 C)   Ht 4' 9.3" (1.455 m)   Wt 135 lb 9.6 oz (61.5 kg)   LMP 01/18/2015 (Approximate)   SpO2 96%   BMI 29.04 kg/m   Wt Readings from  Last 3 Encounters:  07/02/16 135 lb 9.6 oz (61.5 kg)  04/11/16 139 lb (63 kg)  10/19/15 139 lb 9.6 oz (63.3 kg)    Physical Exam  Constitutional: She is oriented to person, place, and time. She appears well-developed and well-nourished. No distress.  HENT:  Head: Normocephalic and atraumatic.  Eyes: Conjunctivae and lids are normal. Right eye exhibits no discharge. Left eye exhibits no discharge. No scleral icterus.  Neck: Normal range of motion. Neck supple. No JVD present. Carotid bruit is not present.  Cardiovascular: Normal rate, regular rhythm and normal heart sounds.   Pulmonary/Chest: Effort normal and breath sounds normal.  Abdominal: Normal appearance. There is no splenomegaly or hepatomegaly.  Musculoskeletal: Normal range of motion.  Neurological: She is alert and oriented to person, place, and time.  Skin: Skin is warm, dry and intact. No rash noted. No pallor.  Psychiatric: She has a normal mood and affect.  Her behavior is normal. Judgment and thought content normal.      Assessment & Plan:   Problem List Items Addressed This Visit      Unprioritized   Allergic rhinitis    Recommended adding Zyrtec to current treatment      Asthma    Continue with cough.  Observed inhaler technique and corrected some deficiencies      GERD (gastroesophageal reflux disease)    Take Omeprazole daily.  Hopefully this will help asthma      Hyperlipidemia    Check Lipid panel and liver enzymes.  Continue with simvastatin      Hypothyroidism    Check TSH       Other Visit Diagnoses    Annual physical exam    -  Primary      F/u in 3 months for a cough and reflux.  Does not want to see GI at this time.     Follow up plan: No Follow-up on file.

## 2016-07-03 LAB — CBC WITH DIFFERENTIAL/PLATELET
Basophils Absolute: 0 10*3/uL (ref 0.0–0.2)
Basos: 0 %
EOS (ABSOLUTE): 0.1 10*3/uL (ref 0.0–0.4)
Eos: 1 %
Hematocrit: 43 % (ref 34.0–46.6)
Hemoglobin: 14.4 g/dL (ref 11.1–15.9)
Immature Grans (Abs): 0 10*3/uL (ref 0.0–0.1)
Immature Granulocytes: 0 %
Lymphocytes Absolute: 1.5 10*3/uL (ref 0.7–3.1)
Lymphs: 24 %
MCH: 29.8 pg (ref 26.6–33.0)
MCHC: 33.5 g/dL (ref 31.5–35.7)
MCV: 89 fL (ref 79–97)
Monocytes Absolute: 0.4 10*3/uL (ref 0.1–0.9)
Monocytes: 6 %
Neutrophils Absolute: 4.4 10*3/uL (ref 1.4–7.0)
Neutrophils: 69 %
Platelets: 380 10*3/uL — ABNORMAL HIGH (ref 150–379)
RBC: 4.83 x10E6/uL (ref 3.77–5.28)
RDW: 14 % (ref 12.3–15.4)
WBC: 6.4 10*3/uL (ref 3.4–10.8)

## 2016-07-03 LAB — COMPREHENSIVE METABOLIC PANEL
ALT: 21 IU/L (ref 0–32)
AST: 19 IU/L (ref 0–40)
Albumin/Globulin Ratio: 1.6 (ref 1.2–2.2)
Albumin: 4.7 g/dL (ref 3.5–5.5)
Alkaline Phosphatase: 58 IU/L (ref 39–117)
BUN/Creatinine Ratio: 8 — ABNORMAL LOW (ref 9–23)
BUN: 7 mg/dL (ref 6–24)
Bilirubin Total: 0.5 mg/dL (ref 0.0–1.2)
CO2: 22 mmol/L (ref 18–29)
Calcium: 10 mg/dL (ref 8.7–10.2)
Chloride: 103 mmol/L (ref 96–106)
Creatinine, Ser: 0.89 mg/dL (ref 0.57–1.00)
GFR calc Af Amer: 83 mL/min/{1.73_m2} (ref >59)
GFR calc non Af Amer: 72 mL/min/{1.73_m2} (ref >59)
Globulin, Total: 3 g/dL (ref 1.5–4.5)
Glucose: 112 mg/dL — ABNORMAL HIGH (ref 65–99)
Potassium: 4.5 mmol/L (ref 3.5–5.2)
Sodium: 139 mmol/L (ref 134–144)
Total Protein: 7.7 g/dL (ref 6.0–8.5)

## 2016-07-03 LAB — LIPID PANEL W/O CHOL/HDL RATIO
Cholesterol, Total: 193 mg/dL (ref 100–199)
HDL: 52 mg/dL (ref >39)
LDL Calculated: 111 mg/dL — ABNORMAL HIGH (ref 0–99)
Triglycerides: 148 mg/dL (ref 0–149)
VLDL Cholesterol Cal: 30 mg/dL (ref 5–40)

## 2016-07-03 LAB — TSH: TSH: 4.29 u[IU]/mL (ref 0.450–4.500)

## 2016-07-03 LAB — VITAMIN D 25 HYDROXY (VIT D DEFICIENCY, FRACTURES): Vit D, 25-Hydroxy: 21.2 ng/mL — ABNORMAL LOW (ref 30.0–100.0)

## 2016-07-03 NOTE — Progress Notes (Signed)
Normal labs.  Pt notified through mychart

## 2016-10-03 ENCOUNTER — Encounter: Payer: Self-pay | Admitting: Unknown Physician Specialty

## 2016-10-03 ENCOUNTER — Ambulatory Visit (INDEPENDENT_AMBULATORY_CARE_PROVIDER_SITE_OTHER): Payer: Managed Care, Other (non HMO) | Admitting: Unknown Physician Specialty

## 2016-10-03 VITALS — BP 128/84 | HR 88 | Temp 98.9°F | Wt 133.8 lb

## 2016-10-03 DIAGNOSIS — K219 Gastro-esophageal reflux disease without esophagitis: Secondary | ICD-10-CM | POA: Diagnosis not present

## 2016-10-03 NOTE — Assessment & Plan Note (Signed)
No help with Omeprazole.  Will refer to GI for further evaluation

## 2016-10-03 NOTE — Progress Notes (Signed)
BP 128/84   Pulse 88   Temp 98.9 F (37.2 C)   Wt 133 lb 12.8 oz (60.7 kg)   LMP 01/18/2015 (Approximate)   SpO2 97%   BMI 28.65 kg/m    Subjective:    Patient ID: Denise Proctor, female    DOB: 1960/11/15, 56 y.o.   MRN: [id number]  HPI: Denise Proctor is a 56 y.o. female  Chief Complaint  Patient presents with  . Asthma    3 month f/up  . Gastroesophageal Reflux    3 month f/up    Pt is here to to f/u on GERD and asthma.  Started on Omeprazole.  States she is doing a little better.  She still has phlegm there and food getting stuck. 13 years ago she remembers a hiatal hernia.  No real help with Omeprazole   Taking Symbicort and Proventil  She snores at night.  Does not always feel rested.  She does not fall asleep during the day  Relevant past medical, surgical, family and social history reviewed and updated as indicated. Interim medical history since our last visit reviewed. Allergies and medications reviewed and updated.  Review of Systems  Per HPI unless specifically indicated above     Objective:    BP 128/84   Pulse 88   Temp 98.9 F (37.2 C)   Wt 133 lb 12.8 oz (60.7 kg)   LMP 01/18/2015 (Approximate)   SpO2 97%   BMI 28.65 kg/m   Wt Readings from Last 3 Encounters:  10/03/16 133 lb 12.8 oz (60.7 kg)  07/02/16 135 lb 9.6 oz (61.5 kg)  04/11/16 139 lb (63 kg)    Physical Exam  Constitutional: She is oriented to person, place, and time. She appears well-developed and well-nourished. No distress.  HENT:  Head: Normocephalic and atraumatic.  Eyes: Conjunctivae and lids are normal. Right eye exhibits no discharge. Left eye exhibits no discharge. No scleral icterus.  Neck: Normal range of motion. Neck supple. No JVD present. Carotid bruit is not present.  Cardiovascular: Normal rate, regular rhythm and normal heart sounds.   Pulmonary/Chest: Effort normal and breath sounds normal.  Abdominal: Normal appearance. There is no splenomegaly or hepatomegaly.    Musculoskeletal: Normal range of motion.  Neurological: She is alert and oriented to person, place, and time.  Skin: Skin is warm, dry and intact. No rash noted. No pallor.  Psychiatric: She has a normal mood and affect. Her behavior is normal. Judgment and thought content normal.    Results for orders placed or performed in visit on 07/02/16  CBC with Differential/Platelet  Result Value Ref Range   WBC 6.4 3.4 - 10.8 x10E3/uL   RBC 4.83 3.77 - 5.28 x10E6/uL   Hemoglobin 14.4 11.1 - 15.9 g/dL   Hematocrit 86.5 78.4 - 46.6 %   MCV 89 79 - 97 fL   MCH 29.8 26.6 - 33.0 pg   MCHC 33.5 31.5 - 35.7 g/dL   RDW 69.6 29.5 - 28.4 %   Platelets 380 (H) 150 - 379 x10E3/uL   Neutrophils 69 Not Estab. %   Lymphs 24 Not Estab. %   Monocytes 6 Not Estab. %   Eos 1 Not Estab. %   Basos 0 Not Estab. %   Neutrophils Absolute 4.4 1.4 - 7.0 x10E3/uL   Lymphocytes Absolute 1.5 0.7 - 3.1 x10E3/uL   Monocytes Absolute 0.4 0.1 - 0.9 x10E3/uL   EOS (ABSOLUTE) 0.1 0.0 - 0.4 x10E3/uL   Basophils Absolute  0.0 0.0 - 0.2 x10E3/uL   Immature Granulocytes 0 Not Estab. %   Immature Grans (Abs) 0.0 0.0 - 0.1 x10E3/uL  Comprehensive metabolic panel  Result Value Ref Range   Glucose 112 (H) 65 - 99 mg/dL   BUN 7 6 - 24 mg/dL   Creatinine, Ser 6.040.89 0.57 - 1.00 mg/dL   GFR calc non Af Amer 72 >59 mL/min/1.73   GFR calc Af Amer 83 >59 mL/min/1.73   BUN/Creatinine Ratio 8 (L) 9 - 23   Sodium 139 134 - 144 mmol/L   Potassium 4.5 3.5 - 5.2 mmol/L   Chloride 103 96 - 106 mmol/L   CO2 22 18 - 29 mmol/L   Calcium 10.0 8.7 - 10.2 mg/dL   Total Protein 7.7 6.0 - 8.5 g/dL   Albumin 4.7 3.5 - 5.5 g/dL   Globulin, Total 3.0 1.5 - 4.5 g/dL   Albumin/Globulin Ratio 1.6 1.2 - 2.2   Bilirubin Total 0.5 0.0 - 1.2 mg/dL   Alkaline Phosphatase 58 39 - 117 IU/L   AST 19 0 - 40 IU/L   ALT 21 0 - 32 IU/L  Lipid Panel w/o Chol/HDL Ratio  Result Value Ref Range   Cholesterol, Total 193 100 - 199 mg/dL   Triglycerides 540148 0  - 149 mg/dL   HDL 52 >98>39 mg/dL   VLDL Cholesterol Cal 30 5 - 40 mg/dL   LDL Calculated 119111 (H) 0 - 99 mg/dL  TSH  Result Value Ref Range   TSH 4.290 0.450 - 4.500 uIU/mL  VITAMIN D 25 Hydroxy (Vit-D Deficiency, Fractures)  Result Value Ref Range   Vit D, 25-Hydroxy 21.2 (L) 30.0 - 100.0 ng/mL      Assessment & Plan:   Problem List Items Addressed This Visit      Unprioritized   GERD (gastroesophageal reflux disease) - Primary    No help with Omeprazole.  Will refer to GI for further evaluation      Relevant Orders   Ambulatory referral to Gastroenterology       Follow up plan: Return in about 6 months (around 04/02/2017).

## 2016-10-08 ENCOUNTER — Other Ambulatory Visit: Payer: Self-pay | Admitting: Unknown Physician Specialty

## 2016-10-08 DIAGNOSIS — E039 Hypothyroidism, unspecified: Secondary | ICD-10-CM

## 2016-11-04 ENCOUNTER — Ambulatory Visit (INDEPENDENT_AMBULATORY_CARE_PROVIDER_SITE_OTHER): Payer: 59 | Admitting: Gastroenterology

## 2016-11-04 ENCOUNTER — Encounter: Payer: Self-pay | Admitting: Gastroenterology

## 2016-11-04 VITALS — BP 128/83 | HR 85 | Temp 98.6°F | Ht <= 58 in | Wt 134.6 lb

## 2016-11-04 DIAGNOSIS — K219 Gastro-esophageal reflux disease without esophagitis: Secondary | ICD-10-CM | POA: Diagnosis not present

## 2016-11-04 MED ORDER — OMEPRAZOLE 20 MG PO CPDR: 20.0000 mg | DELAYED_RELEASE_CAPSULE | Freq: Two times a day (BID) | ORAL | 0 refills | Status: DC

## 2016-11-04 NOTE — Progress Notes (Signed)
Arlyss Repress, MD [address]  [address]  [place], [place] [address]  Main: [phone]  Fax: [phone]    Gastroenterology Consultation  Referring Provider:     Gabriel Cirri, NP Primary Care Physician:  Gabriel Cirri, NP Primary Gastroenterologist:  Dr. Arlyss Repress Reason for Consultation:     GERD        HPI:   Denise Proctor is a 56 y.o. y/o female referred by Dr. Gabriel Cirri, NP  for consultation & management of chronic GERD, heart burn, phlegm in her throat, regurgitation. On prilosec  daily in morning before breakfast. Last 1 month, experiencing regurgitation several times a week. Denies nocturnal symptoms. Intentionally lost 10lbs. Denies other upper GI symptoms.  Does not smoke or drink ETOH Denies GI malignancy in family  GI Procedures: Colonoscopy at age 59, 04/27/2009 EGD at age 27, small hiatal hernia  Past Medical History:  Diagnosis Date  . Anxiety   . Depression   . GERD (gastroesophageal reflux disease)   . Hyperlipidemia   . Osteopenia   . Thyroid disease     Past Surgical History:  Procedure Laterality Date  . TUBAL LIGATION      Prior to Admission medications   Medication Sig Start Date End Date Taking? Authorizing Provider  budesonide-formoterol (SYMBICORT) 160-4.5 MCG/ACT inhaler Inhale 2 puffs into the lungs 2 (two) times daily. 04/11/16  Yes Gabriel Cirri, NP  levothyroxine (SYNTHROID, LEVOTHROID) 50 MCG tablet TAKE 1 TABLET BY MOUTH  DAILY 10/08/16  Yes Gabriel Cirri, NP  montelukast (SINGULAIR) 10 MG tablet Take 1 tablet (10 mg total) by mouth at bedtime. 04/11/16  Yes Gabriel Cirri, NP  omeprazole (PRILOSEC) 20 MG capsule Take 1 capsule (20 mg total) by mouth daily. 04/11/16  Yes Gabriel Cirri, NP  PROAIR HFA 108 [phone] Base) MCG/ACT inhaler USE 2 PUFFS EVERY 6 HOURS  AS NEEDED FOR WHEEZING OR  SHORTNESS OF BREATH 10/08/16  Yes Gabriel Cirri, NP  simvastatin (ZOCOR) 20 MG tablet TAKE 1 TABLET BY MOUTH  DAILY AT 6 PM. 10/08/16  Yes  Gabriel Cirri, NP    Family History  Problem Relation Age of Onset  . Hypertension Mother   . Hyperlipidemia Mother   . COPD Mother   . Heart disease Father   . Diabetes Maternal Grandmother   . Cancer Paternal Grandmother        stomach  . Stroke Paternal Grandfather   . Heart disease Paternal Grandfather   . Hyperlipidemia Brother   . Diverticulitis Brother   . Heart disease Maternal Grandfather   . Diverticulitis Brother      Social History  Substance Use Topics  . Smoking status: Never Smoker  . Smokeless tobacco: Never Used  . Alcohol use No    Allergies as of 11/04/2016  . (No Known Allergies)    Review of Systems:    All systems reviewed and negative except where noted in HPI.   Physical Exam:  BP 128/83   Pulse 85   Temp 98.6 F (37 C) (Oral)   Ht  (1.448 m)   Wt 134 lb 9.6 oz (61.1 kg)   LMP 01/18/2015 (Approximate)   BMI 29.13 kg/m  Patient's last menstrual period was 01/18/2015 (approximate).  General:   Alert,  Well-developed, well-nourished, pleasant and cooperative in NAD Head:  Normocephalic and atraumatic. Eyes:  Sclera clear, no icterus.   Conjunctiva pink. Ears:  Normal auditory acuity. Nose:  No deformity, discharge, or lesions. Mouth:  No deformity or lesions,oropharynx pink & moist. Neck:  Supple; no masses or thyromegaly. Lungs:  Respirations even and unlabored.  Clear throughout to auscultation.   No wheezes, crackles, or rhonchi. No acute distress. Heart:  Regular rate and rhythm; no murmurs, clicks, rubs, or gallops. Abdomen:  Normal bowel sounds.  No bruits.  Soft, non-tender and non-distended without masses, hepatosplenomegaly or hernias noted.  No guarding or rebound tenderness.   Rectal: Nor performed Msk:  Symmetrical without gross deformities. Good, equal movement & strength bilaterally. Pulses:  Normal pulses noted. Extremities:  No clubbing or edema.  No cyanosis. Neurologic:  Alert and oriented x3;  grossly normal  neurologically. Skin:  Intact without significant lesions or rashes. No jaundice. Lymph Nodes:  No significant cervical adenopathy. Psych:  Alert and cooperative. Normal mood and affect.  Imaging Studies: None  Assessment and Plan:   Denise Proctor is a 56 y.o. female with chronic GERD, with exacerbation since 1 month. Currently on prilosec  daily  - Increase prilosec to  BID - EGD if symptoms persist after 8-12weeks on high dose PPI   Follow up in 2months   Arlyss Repress, MD

## 2017-01-07 ENCOUNTER — Other Ambulatory Visit: Payer: Self-pay | Admitting: Unknown Physician Specialty

## 2017-01-07 DIAGNOSIS — E039 Hypothyroidism, unspecified: Secondary | ICD-10-CM

## 2017-03-07 ENCOUNTER — Other Ambulatory Visit: Payer: Self-pay | Admitting: Unknown Physician Specialty

## 2017-04-14 ENCOUNTER — Other Ambulatory Visit: Payer: Self-pay | Admitting: Unknown Physician Specialty

## 2017-06-12 ENCOUNTER — Encounter: Payer: Self-pay | Admitting: Family Medicine

## 2017-06-12 ENCOUNTER — Ambulatory Visit: Payer: Managed Care, Other (non HMO) | Admitting: Family Medicine

## 2017-06-12 VITALS — BP 111/77 | HR 79 | Wt 135.0 lb

## 2017-06-12 DIAGNOSIS — R300 Dysuria: Secondary | ICD-10-CM | POA: Diagnosis not present

## 2017-06-12 DIAGNOSIS — M62830 Muscle spasm of back: Secondary | ICD-10-CM

## 2017-06-12 LAB — UA/M W/RFLX CULTURE, ROUTINE
Bilirubin, UA: NEGATIVE
Glucose, UA: NEGATIVE
Ketones, UA: NEGATIVE
Leukocytes, UA: NEGATIVE
Nitrite, UA: NEGATIVE
Protein, UA: NEGATIVE
Specific Gravity, UA: <1.005 — ABNORMAL LOW (ref 1.005–1.030)
Urobilinogen, Ur: 0.2 mg/dL (ref 0.2–1.0)
pH, UA: 5.5 (ref 5.0–7.5)

## 2017-06-12 LAB — MICROSCOPIC EXAMINATION

## 2017-06-12 MED ORDER — CYCLOBENZAPRINE HCL 10 MG PO TABS: 10.0000 mg | ORAL_TABLET | Freq: Every day | ORAL | 0 refills | Status: DC

## 2017-06-12 MED ORDER — NAPROXEN 500 MG PO TABS: 500.0000 mg | ORAL_TABLET | Freq: Two times a day (BID) | ORAL | 1 refills | Status: DC

## 2017-06-12 NOTE — Patient Instructions (Signed)

## 2017-06-12 NOTE — Progress Notes (Signed)
BP 111/77   Pulse 79   Wt 135 lb (61.2 kg)   LMP 01/18/2015 (Approximate)   SpO2 98%   BMI 29.21 kg/m    Subjective:    Patient ID: Denise Proctor, female    DOB: [birth year], 57 y.o.   MRN: [id number]  HPI: Denise Proctor is a 57 y.o. female  Chief Complaint  Patient presents with  . Back Pain    on L side. Hurts when patient gets up or down.    BACK PAIN Duration: 2 weeks Mechanism of injury: unknown Location: bilateral low back now on the R side low back Onset: sudden Severity: severe Quality: dull pain Frequency: with movement Radiation: down into her leg Aggravating factors: going from sitting to standing, laying down in bed Alleviating factors: ibuprofen Status: stable Treatments attempted: rest and ibuprofen  Relief with NSAIDs?: moderate Nighttime pain:  yes Paresthesias / decreased sensation:  no Bowel / bladder incontinence:  no Fevers:  no Dysuria / urinary frequency:  no  Relevant past medical, surgical, family and social history reviewed and updated as indicated. Interim medical history since our last visit reviewed. Allergies and medications reviewed and updated.  Review of Systems  Constitutional: Negative.   Respiratory: Negative.   Cardiovascular: Negative.   Musculoskeletal: Positive for back pain and myalgias. Negative for arthralgias, gait problem, joint swelling, neck pain and neck stiffness.  Skin: Negative.   Neurological: Negative.   Hematological: Negative.   Psychiatric/Behavioral: Negative.     Per HPI unless specifically indicated above     Objective:    BP 111/77   Pulse 79   Wt 135 lb (61.2 kg)   LMP 01/18/2015 (Approximate)   SpO2 98%   BMI 29.21 kg/m   Wt Readings from Last 3 Encounters:  06/12/17 135 lb (61.2 kg)  11/04/16 134 lb 9.6 oz (61.1 kg)  10/03/16 133 lb 12.8 oz (60.7 kg)    Physical Exam  Constitutional: She is oriented to person, place, and time. She appears well-developed and well-nourished. No  distress.  HENT:  Head: Normocephalic and atraumatic.  Right Ear: Hearing normal.  Left Ear: Hearing normal.  Nose: Nose normal.  Eyes: Conjunctivae and lids are normal. Right eye exhibits no discharge. Left eye exhibits no discharge. No scleral icterus.  Cardiovascular: Normal rate, regular rhythm, normal heart sounds and intact distal pulses. Exam reveals no gallop and no friction rub.  No murmur heard. Pulmonary/Chest: Effort normal and breath sounds normal. No stridor. No respiratory distress. She has no wheezes. She has no rales. She exhibits no tenderness.  Neurological: She is alert and oriented to person, place, and time.  Skin: Skin is warm, dry and intact. Capillary refill takes less than 2 seconds. No rash noted. She is not diaphoretic. No erythema. No pallor.  Psychiatric: She has a normal mood and affect. Her speech is normal and behavior is normal. Judgment and thought content normal. Cognition and memory are normal.  Back Exam:    Inspection:  Normal spinal curvature.  No deformity, ecchymosis, erythema, or lesions     Palpation:     Midline spinal tenderness: no      Paralumbar tenderness: yes Left     Parathoracic tenderness: no      Buttocks tenderness: yes Left     Range of Motion:      Flexion: Fingers to Knees     Extension:Decreased     Lateral bending:Decreased    Rotation:Decreased    Neuro Exam:Lower extremity  DTRs normal & symmetric.  Strength and sensation intact.    Special Tests:      Straight leg raise:negative- but very tight hamstrings bilaterally   Results for orders placed or performed in visit on 07/02/16  CBC with Differential/Platelet  Result Value Ref Range   WBC 6.4 3.4 - 10.8 x10E3/uL   RBC 4.83 3.77 - 5.28 x10E6/uL   Hemoglobin 14.4 11.1 - 15.9 g/dL   Hematocrit 40.9 81.1 - 46.6 %   MCV 89 79 - 97 fL   MCH 29.8 26.6 - 33.0 pg   MCHC 33.5 31.5 - 35.7 g/dL   RDW 91.4 78.2 - 95.6 %   Platelets 380 (H) 150 - 379 x10E3/uL   Neutrophils 69  Not Estab. %   Lymphs 24 Not Estab. %   Monocytes 6 Not Estab. %   Eos 1 Not Estab. %   Basos 0 Not Estab. %   Neutrophils Absolute 4.4 1.4 - 7.0 x10E3/uL   Lymphocytes Absolute 1.5 0.7 - 3.1 x10E3/uL   Monocytes Absolute 0.4 0.1 - 0.9 x10E3/uL   EOS (ABSOLUTE) 0.1 0.0 - 0.4 x10E3/uL   Basophils Absolute 0.0 0.0 - 0.2 x10E3/uL   Immature Granulocytes 0 Not Estab. %   Immature Grans (Abs) 0.0 0.0 - 0.1 x10E3/uL  Comprehensive metabolic panel  Result Value Ref Range   Glucose 112 (H) 65 - 99 mg/dL   BUN 7 6 - 24 mg/dL   Creatinine, Ser 2.13 0.57 - 1.00 mg/dL   GFR calc non Af Amer 72 >59 mL/min/1.73   GFR calc Af Amer 83 >59 mL/min/1.73   BUN/Creatinine Ratio 8 (L) 9 - 23   Sodium 139 134 - 144 mmol/L   Potassium 4.5 3.5 - 5.2 mmol/L   Chloride 103 96 - 106 mmol/L   CO2 22 18 - 29 mmol/L   Calcium 10.0 8.7 - 10.2 mg/dL   Total Protein 7.7 6.0 - 8.5 g/dL   Albumin 4.7 3.5 - 5.5 g/dL   Globulin, Total 3.0 1.5 - 4.5 g/dL   Albumin/Globulin Ratio 1.6 1.2 - 2.2   Bilirubin Total 0.5 0.0 - 1.2 mg/dL   Alkaline Phosphatase 58 39 - 117 IU/L   AST 19 0 - 40 IU/L   ALT 21 0 - 32 IU/L  Lipid Panel w/o Chol/HDL Ratio  Result Value Ref Range   Cholesterol, Total 193 100 - 199 mg/dL   Triglycerides 086 0 - 149 mg/dL   HDL 52 >57 mg/dL   VLDL Cholesterol Cal 30 5 - 40 mg/dL   LDL Calculated 846 (H) 0 - 99 mg/dL  TSH  Result Value Ref Range   TSH 4.290 0.450 - 4.500 uIU/mL  VITAMIN D 25 Hydroxy (Vit-D Deficiency, Fractures)  Result Value Ref Range   Vit D, 25-Hydroxy 21.2 (L) 30.0 - 100.0 ng/mL      Assessment & Plan:   Problem List Items Addressed This Visit    None    Visit Diagnoses    Spasm of lumbar paraspinous muscle    -  Primary   WIll treat with flexeril and naproxen and exercises. Recheck in 2 weeks.    Dysuria       negative UA- likely back spasm   Relevant Orders   UA/M w/rflx Culture, Routine       Follow up plan: Return in about 2 weeks (around 06/26/2017)  for follow up back pain.

## 2017-06-26 ENCOUNTER — Ambulatory Visit (INDEPENDENT_AMBULATORY_CARE_PROVIDER_SITE_OTHER): Payer: Managed Care, Other (non HMO) | Admitting: Family Medicine

## 2017-06-26 ENCOUNTER — Encounter: Payer: Self-pay | Admitting: Family Medicine

## 2017-06-26 VITALS — BP 106/74 | HR 84 | Temp 98.8°F | Wt 125.5 lb

## 2017-06-26 DIAGNOSIS — M62830 Muscle spasm of back: Secondary | ICD-10-CM | POA: Diagnosis not present

## 2017-06-26 NOTE — Progress Notes (Signed)
BP 106/74 (BP Location: Right Arm, Patient Position: Sitting, Cuff Size: Normal)   Pulse 84   Temp 98.8 F (37.1 C) (Oral)   Wt 125 lb 8 oz (56.9 kg)   LMP 01/18/2015 (Approximate)   SpO2 99%   BMI 27.16 kg/m    Subjective:    Patient ID: Denise Proctor, female    DOB: [birth year], 57 y.o.   MRN: [id number]  HPI: Denise Proctor is a 57 y.o. female  Chief Complaint  Patient presents with  . Follow-up    Patient stated she is better   Back is feeling better. No concerns. Feeling well.   Relevant past medical, surgical, family and social history reviewed and updated as indicated. Interim medical history since our last visit reviewed. Allergies and medications reviewed and updated.  Review of Systems  Constitutional: Negative.   Respiratory: Negative.   Cardiovascular: Negative.   Musculoskeletal: Negative.   Psychiatric/Behavioral: Negative.     Per HPI unless specifically indicated above     Objective:    BP 106/74 (BP Location: Right Arm, Patient Position: Sitting, Cuff Size: Normal)   Pulse 84   Temp 98.8 F (37.1 C) (Oral)   Wt 125 lb 8 oz (56.9 kg)   LMP 01/18/2015 (Approximate)   SpO2 99%   BMI 27.16 kg/m   Wt Readings from Last 3 Encounters:  06/26/17 125 lb 8 oz (56.9 kg)  06/12/17 135 lb (61.2 kg)  11/04/16 134 lb 9.6 oz (61.1 kg)    Physical Exam  Constitutional: She is oriented to person, place, and time. She appears well-developed and well-nourished. No distress.  HENT:  Head: Normocephalic and atraumatic.  Right Ear: Hearing normal.  Left Ear: Hearing normal.  Nose: Nose normal.  Eyes: Conjunctivae and lids are normal. Right eye exhibits no discharge. Left eye exhibits no discharge. No scleral icterus.  Cardiovascular: Normal rate, regular rhythm, normal heart sounds and intact distal pulses. Exam reveals no gallop and no friction rub.  No murmur heard. Pulmonary/Chest: Effort normal and breath sounds normal. No stridor. No respiratory  distress. She has no wheezes. She has no rales. She exhibits no tenderness.  Musculoskeletal: Normal range of motion.  Neurological: She is alert and oriented to person, place, and time.  Skin: Skin is warm, dry and intact. Capillary refill takes less than 2 seconds. No rash noted. She is not diaphoretic. No erythema. No pallor.  Psychiatric: She has a normal mood and affect. Her speech is normal and behavior is normal. Judgment and thought content normal. Cognition and memory are normal.  Nursing note and vitals reviewed.   Results for orders placed or performed in visit on 06/12/17  Microscopic Examination  Result Value Ref Range   WBC, UA 0-5 0 - 5 /hpf   RBC, UA 0-2 0 - 2 /hpf   Epithelial Cells (non renal) 0-10 0 - 10 /hpf   Bacteria, UA Few None seen/Few  UA/M w/rflx Culture, Routine  Result Value Ref Range   Specific Gravity, UA <1.005 (L) 1.005 - 1.030   pH, UA 5.5 5.0 - 7.5   Color, UA Yellow Yellow   Appearance Ur Hazy (A) Clear   Leukocytes, UA Negative Negative   Protein, UA Negative Negative/Trace   Glucose, UA Negative Negative   Ketones, UA Negative Negative   RBC, UA Trace (A) Negative   Bilirubin, UA Negative Negative   Urobilinogen, Ur 0.2 0.2 - 1.0 mg/dL   Nitrite, UA Negative Negative   Microscopic Examination  See below:       Assessment & Plan:   Problem List Items Addressed This Visit    None    Visit Diagnoses    Spasm of lumbar paraspinous muscle    -  Primary   Resolved. Call with any concerns.        Follow up plan: Return After 5.30.19 for physical with PCP.

## 2017-07-11 ENCOUNTER — Other Ambulatory Visit: Payer: Self-pay | Admitting: Unknown Physician Specialty

## 2017-07-13 NOTE — Telephone Encounter (Addendum)
symbicort refill Last Refill:04/11/16 # 3 inhalers 3 RF Last OV: 10/03/16 PCP: Gabriel Cirriheryl Wicker NP Pharmacy:[facility]

## 2017-08-11 ENCOUNTER — Ambulatory Visit (INDEPENDENT_AMBULATORY_CARE_PROVIDER_SITE_OTHER): Payer: Managed Care, Other (non HMO) | Admitting: Unknown Physician Specialty

## 2017-08-11 ENCOUNTER — Encounter: Payer: Self-pay | Admitting: Unknown Physician Specialty

## 2017-08-11 ENCOUNTER — Ambulatory Visit
Admission: RE | Admit: 2017-08-11 | Discharge: 2017-08-11 | Disposition: A | Discharge status: Home / Self Care | Payer: Managed Care, Other (non HMO) | Source: Ambulatory Visit | Attending: Unknown Physician Specialty | Admitting: Unknown Physician Specialty

## 2017-08-11 VITALS — BP 109/76 | HR 76 | Temp 98.7°F | Ht <= 58 in | Wt 122.2 lb

## 2017-08-11 DIAGNOSIS — E039 Hypothyroidism, unspecified: Secondary | ICD-10-CM

## 2017-08-11 DIAGNOSIS — Z23 Encounter for immunization: Secondary | ICD-10-CM

## 2017-08-11 DIAGNOSIS — E782 Mixed hyperlipidemia: Secondary | ICD-10-CM | POA: Diagnosis not present

## 2017-08-11 DIAGNOSIS — R634 Abnormal weight loss: Secondary | ICD-10-CM

## 2017-08-11 DIAGNOSIS — J45909 Unspecified asthma, uncomplicated: Secondary | ICD-10-CM | POA: Diagnosis not present

## 2017-08-11 DIAGNOSIS — K219 Gastro-esophageal reflux disease without esophagitis: Secondary | ICD-10-CM

## 2017-08-11 DIAGNOSIS — Z0001 Encounter for general adult medical examination with abnormal findings: Secondary | ICD-10-CM | POA: Diagnosis not present

## 2017-08-11 DIAGNOSIS — Z0000 Encounter for general adult medical examination without abnormal findings: Secondary | ICD-10-CM

## 2017-08-11 MED ORDER — SIMVASTATIN 20 MG PO TABS: ORAL_TABLET | ORAL | 1 refills | Status: DC

## 2017-08-11 NOTE — Progress Notes (Signed)
BP 109/76   Pulse 76   Temp 98.7 F (37.1 C) (Oral)   Ht 4' 8.6" (1.438 m)   Wt 122 lb 3.2 oz (55.4 kg)   LMP 01/18/2015 (Approximate)   SpO2 98%   BMI 26.82 kg/m    Subjective:    Patient ID: Denise Proctor, female    DOB: [birth year], 57 y.o.   MRN: [id number]  HPI: Denise Proctor is a 57 y.o. female  Chief Complaint  Patient presents with  . Annual Exam   ASTHMA Taking inhalers as directed. Symptoms are well controlled Night time symptoms:none Wheeze/SOB:occasional. Needing to use rescue inhaler about once a week  Hypothyroid Taking medications without problem.  No fatigue or palpitations.  She is losing weight without trying.     Elevated Cholesterol Using medications without problems No Muscle aches  Diet: Exercise: Walking twice a day  GERD Having reflux.  Taking Protonix 20 mg daily.    Relevant past medical, surgical, family and social history reviewed and updated as indicated. Interim medical history since our last visit reviewed. Allergies and medications reviewed and updated.  Review of Systems  Per HPI unless specifically indicated above     Objective:    BP 109/76   Pulse 76   Temp 98.7 F (37.1 C) (Oral)   Ht 4' 8.6" (1.438 m)   Wt 122 lb 3.2 oz (55.4 kg)   LMP 01/18/2015 (Approximate)   SpO2 98%   BMI 26.82 kg/m   Wt Readings from Last 3 Encounters:  08/11/17 122 lb 3.2 oz (55.4 kg)  06/26/17 125 lb 8 oz (56.9 kg)  06/12/17 135 lb (61.2 kg)    Physical Exam  Constitutional: She is oriented to person, place, and time. She appears well-developed and well-nourished.  HENT:  Head: Normocephalic and atraumatic.  Eyes: Pupils are equal, round, and reactive to light. Right eye exhibits no discharge. Left eye exhibits no discharge. No scleral icterus.  Neck: Normal range of motion. Neck supple. Carotid bruit is not present. No thyromegaly present.  Cardiovascular: Normal rate, regular rhythm and normal heart sounds. Exam reveals no gallop  and no friction rub.  No murmur heard. Pulmonary/Chest: Effort normal and breath sounds normal. No respiratory distress. She has no wheezes. She has no rales. No breast tenderness or discharge.  Abdominal: Soft. Bowel sounds are normal. There is no tenderness. There is no rebound.  Genitourinary: No breast tenderness or discharge.  Musculoskeletal: Normal range of motion.  Lymphadenopathy:    She has no cervical adenopathy.  Neurological: She is alert and oriented to person, place, and time.  Skin: Skin is warm, dry and intact. No rash noted.  Psychiatric: She has a normal mood and affect. Her speech is normal and behavior is normal. Judgment and thought content normal. Cognition and memory are normal.    Results for orders placed or performed in visit on 06/12/17  Microscopic Examination  Result Value Ref Range   WBC, UA 0-5 0 - 5 /hpf   RBC, UA 0-2 0 - 2 /hpf   Epithelial Cells (non renal) 0-10 0 - 10 /hpf   Bacteria, UA Few None seen/Few  UA/M w/rflx Culture, Routine  Result Value Ref Range   Specific Gravity, UA <1.005 (L) 1.005 - 1.030   pH, UA 5.5 5.0 - 7.5   Color, UA Yellow Yellow   Appearance Ur Hazy (A) Clear   Leukocytes, UA Negative Negative   Protein, UA Negative Negative/Trace   Glucose, UA Negative  Negative   Ketones, UA Negative Negative   RBC, UA Trace (A) Negative   Bilirubin, UA Negative Negative   Urobilinogen, Ur 0.2 0.2 - 1.0 mg/dL   Nitrite, UA Negative Negative   Microscopic Examination See below:       Assessment & Plan:   Problem List Items Addressed This Visit      Unprioritized   Asthma    Stable, continue present medications.        GERD (gastroesophageal reflux disease) - Primary   Relevant Orders   Ambulatory referral to Gastroenterology   Hyperlipidemia   Relevant Medications   simvastatin (ZOCOR) 20 MG tablet   Other Relevant Orders   Lipid Panel w/o Chol/HDL Ratio   Hypothyroidism    Recent weight loss.  Check TSH       Relevant Orders   TSH   Unexplained weight loss    Check labs.  Order chest x-ray.  Refer to GI for evaluation considering weight loss and reflux      Relevant Orders   Ambulatory referral to Gastroenterology   CBC with Differential/Platelet   Comprehensive metabolic panel   DG Chest 2 View    Other Visit Diagnoses    Annual physical exam       Relevant Orders   MM DIGITAL SCREENING BILATERAL       Follow up plan: Follow up results of labs and x-rays.  Recheck in 1 month

## 2017-08-11 NOTE — Addendum Note (Signed)
Addended by: Pablo LedgerUSSELL, BRITTANY N on: 08/11/2017 12:12 PM   Modules accepted: Orders

## 2017-08-11 NOTE — Assessment & Plan Note (Signed)
Recent weight loss.  Check TSH

## 2017-08-11 NOTE — Assessment & Plan Note (Signed)
Stable, continue present medications.   

## 2017-08-11 NOTE — Assessment & Plan Note (Signed)
Check labs.  Order chest x-ray.  Refer to GI for evaluation considering weight loss and reflux

## 2017-08-12 LAB — COMPREHENSIVE METABOLIC PANEL
ALT: 17 IU/L (ref 0–32)
AST: 17 IU/L (ref 0–40)
Albumin/Globulin Ratio: 1.4 (ref 1.2–2.2)
Albumin: 4.3 g/dL (ref 3.5–5.5)
Alkaline Phosphatase: 53 IU/L (ref 39–117)
BUN/Creatinine Ratio: 6 — ABNORMAL LOW (ref 9–23)
BUN: 5 mg/dL — ABNORMAL LOW (ref 6–24)
Bilirubin Total: 0.4 mg/dL (ref 0.0–1.2)
CO2: 25 mmol/L (ref 20–29)
Calcium: 9.6 mg/dL (ref 8.7–10.2)
Chloride: 103 mmol/L (ref 96–106)
Creatinine, Ser: 0.85 mg/dL (ref 0.57–1.00)
GFR calc Af Amer: 87 mL/min/{1.73_m2} (ref >59)
GFR calc non Af Amer: 76 mL/min/{1.73_m2} (ref >59)
Globulin, Total: 3 g/dL (ref 1.5–4.5)
Glucose: 106 mg/dL — ABNORMAL HIGH (ref 65–99)
Potassium: 4.9 mmol/L (ref 3.5–5.2)
Sodium: 143 mmol/L (ref 134–144)
Total Protein: 7.3 g/dL (ref 6.0–8.5)

## 2017-08-12 LAB — CBC WITH DIFFERENTIAL/PLATELET
Basophils Absolute: 0 10*3/uL (ref 0.0–0.2)
Basos: 0 %
EOS (ABSOLUTE): 0.1 10*3/uL (ref 0.0–0.4)
Eos: 1 %
Hematocrit: 40.1 % (ref 34.0–46.6)
Hemoglobin: 13 g/dL (ref 11.1–15.9)
Immature Grans (Abs): 0 10*3/uL (ref 0.0–0.1)
Immature Granulocytes: 0 %
Lymphocytes Absolute: 1.6 10*3/uL (ref 0.7–3.1)
Lymphs: 30 %
MCH: 29.5 pg (ref 26.6–33.0)
MCHC: 32.4 g/dL (ref 31.5–35.7)
MCV: 91 fL (ref 79–97)
Monocytes Absolute: 0.3 10*3/uL (ref 0.1–0.9)
Monocytes: 6 %
Neutrophils Absolute: 3.3 10*3/uL (ref 1.4–7.0)
Neutrophils: 63 %
Platelets: 492 10*3/uL — ABNORMAL HIGH (ref 150–450)
RBC: 4.41 x10E6/uL (ref 3.77–5.28)
RDW: 13.8 % (ref 12.3–15.4)
WBC: 5.3 10*3/uL (ref 3.4–10.8)

## 2017-08-12 LAB — LIPID PANEL W/O CHOL/HDL RATIO
Cholesterol, Total: 200 mg/dL — ABNORMAL HIGH (ref 100–199)
HDL: 44 mg/dL (ref >39)
LDL Calculated: 131 mg/dL — ABNORMAL HIGH (ref 0–99)
Triglycerides: 127 mg/dL (ref 0–149)
VLDL Cholesterol Cal: 25 mg/dL (ref 5–40)

## 2017-08-12 LAB — TSH: TSH: 2.36 u[IU]/mL (ref 0.450–4.500)

## 2017-08-12 NOTE — Progress Notes (Signed)
Pt notified through mychart.

## 2017-08-13 ENCOUNTER — Telehealth: Payer: Self-pay | Admitting: Gastroenterology

## 2017-08-13 NOTE — Telephone Encounter (Signed)
LVM for patient to call and reschedule her 8/13 appt with Dr. Maximino Greenlandahiliani.

## 2017-08-29 ENCOUNTER — Other Ambulatory Visit: Payer: Self-pay | Admitting: Unknown Physician Specialty

## 2017-08-29 DIAGNOSIS — E039 Hypothyroidism, unspecified: Secondary | ICD-10-CM

## 2017-09-01 ENCOUNTER — Ambulatory Visit
Admission: RE | Admit: 2017-09-01 | Discharge: 2017-09-01 | Disposition: A | Discharge status: Home / Self Care | Payer: Managed Care, Other (non HMO) | Source: Ambulatory Visit | Attending: Unknown Physician Specialty | Admitting: Unknown Physician Specialty

## 2017-09-01 DIAGNOSIS — Z0000 Encounter for general adult medical examination without abnormal findings: Secondary | ICD-10-CM | POA: Diagnosis not present

## 2017-09-15 ENCOUNTER — Ambulatory Visit: Payer: Managed Care, Other (non HMO) | Admitting: Gastroenterology

## 2017-09-17 ENCOUNTER — Ambulatory Visit: Payer: 59 | Admitting: Gastroenterology

## 2017-09-22 ENCOUNTER — Ambulatory Visit: Payer: Managed Care, Other (non HMO) | Admitting: Gastroenterology

## 2017-09-22 ENCOUNTER — Encounter: Payer: Self-pay | Admitting: Gastroenterology

## 2017-09-22 VITALS — BP 124/85 | HR 90 | Ht <= 58 in | Wt 119.4 lb

## 2017-09-22 DIAGNOSIS — R1310 Dysphagia, unspecified: Secondary | ICD-10-CM

## 2017-09-22 DIAGNOSIS — R1319 Other dysphagia: Secondary | ICD-10-CM

## 2017-09-22 DIAGNOSIS — R634 Abnormal weight loss: Secondary | ICD-10-CM | POA: Diagnosis not present

## 2017-09-22 NOTE — Patient Instructions (Signed)
F/U 6 months

## 2017-09-22 NOTE — Progress Notes (Signed)
Denise Proctor [address]  [address]  [place], [place] [address]  Main: [phone]  Fax: [phone]   Gastroenterology Consultation  Referring Provider:     Gabriel Cirri, NP Primary Care Physician:  Gabriel Cirri, NP Primary Gastroenterologist:  Dr. Melodie Proctor Reason for Consultation:     Weight loss        HPI:    Chief Complaint  Patient presents with  . New Patient (Initial Visit)    food getting "hung", unexplained weight loss, GERD    Denise Proctor is a 57 y.o. y/o female referred for consultation & management  by Dr. Gabriel Cirri, NP.  Patient referred by her primary care provider due to unexplained weight loss.  Patient has not been trying to lose weight and eats a regular diet, and has lost about 14 pounds in 1 year.  Also reports intermittent dysphagia, that has worsened over the last 6 months and occurs once or twice a month with solid foods only.  No episodes of food impaction.  No nausea or vomiting or abdominal pain.  No altered bowel habits.  No melena.  Reports history of hemorrhoids, and intermittent bright red blood per rectum.  Denies straining with bowel movements.  Has a bowel movement every other day that is soft and formed.  No fever or chills.  Denies any heartburn.  Takes Prilosec daily for heartburn and it is controlled.  Colonoscopy in March 2011, done by Dr. Mechele Collin, for screening, with diverticulosis and internal hemorrhoids seen. EGD done at the same time for heartburn showed gastric erythema and hiatal hernia.  Biopsies were done, biopsy report not available.  Patient states she does not have diabetes but has been told she has prediabetes.  Last glucose on blood work was slightly elevated to 106.  TSH was normal.  Past Medical History:  Diagnosis Date  . Anxiety   . Depression   . GERD (gastroesophageal reflux disease)   . Hyperlipidemia   . Osteopenia   . Thyroid disease     Past Surgical History:  Procedure  Laterality Date  . TUBAL LIGATION      Prior to Admission medications   Medication Sig Start Date End Date Taking? Authorizing Provider  levothyroxine (SYNTHROID, LEVOTHROID) 50 MCG tablet TAKE 1 TABLET BY MOUTH  DAILY 08/31/17  Yes Gabriel Cirri, NP  montelukast (SINGULAIR) 10 MG tablet TAKE 1 TABLET BY MOUTH AT  BEDTIME 03/09/17  Yes Gabriel Cirri, NP  omeprazole (PRILOSEC) 20 MG capsule TAKE 1 CAPSULE BY MOUTH  DAILY 04/15/17  Yes Johnson, Megan P, DO  PROAIR HFA 108 (90 Base) MCG/ACT inhaler USE 2 PUFFS EVERY 6 HOURS  AS NEEDED FOR WHEEZING OR  SHORTNESS OF BREATH 10/08/16  Yes Gabriel Cirri, NP  simvastatin (ZOCOR) 20 MG tablet TAKE 1 TABLET BY MOUTH  DAILY AT 6 PM. 08/11/17  Yes Gabriel Cirri, NP  SYMBICORT 160-4.5 MCG/ACT inhaler USE 2 PUFFS TWO TIMES DAILY 07/13/17  Yes Gabriel Cirri, NP    Family History  Problem Relation Age of Onset  . Hypertension Mother   . Hyperlipidemia Mother   . COPD Mother   . Heart disease Father   . Diabetes Maternal Grandmother   . Cancer Paternal Grandmother        stomach  . Stroke Paternal Grandfather   . Heart disease Paternal Grandfather   . Hyperlipidemia Brother   . Diverticulitis Brother   . Heart disease Maternal Grandfather   . Diverticulitis Brother  Social History   Tobacco Use  . Smoking status: Never Smoker  . Smokeless tobacco: Never Used  Substance Use Topics  . Alcohol use: No    Alcohol/week: 0.0 standard drinks  . Drug use: No    Allergies as of 09/22/2017  . (No Known Allergies)    Review of Systems:    All systems reviewed and negative except where noted in HPI.   Physical Exam:  BP 124/85   Pulse 90   Ht 4\' 8"  (1.422 m)   Wt 119 lb 6.4 oz (54.2 kg)   LMP 01/18/2015 (Approximate)   BMI 26.77 kg/m  Patient's last menstrual period was 01/18/2015 (approximate). Psych:  Alert and cooperative. Normal mood and affect. General:   Alert,  Well-developed, well-nourished, pleasant and cooperative in NAD Head:   Normocephalic and atraumatic. Eyes:  Sclera clear, no icterus.   Conjunctiva pink. Ears:  Normal auditory acuity. Nose:  No deformity, discharge, or lesions. Mouth:  No deformity or lesions,oropharynx pink & moist. Neck:  Supple; no masses or thyromegaly. Lungs:  Respirations even and unlabored.  Clear throughout to auscultation.   No wheezes, crackles, or rhonchi. No acute distress. Heart:  Regular rate and rhythm; no murmurs, clicks, rubs, or gallops. Abdomen:  Normal bowel sounds.  No bruits.  Soft, non-tender and non-distended without masses, hepatosplenomegaly or hernias noted.  No guarding or rebound tenderness.    Msk:  Symmetrical without gross deformities. Good, equal movement & strength bilaterally. Pulses:  Normal pulses noted. Extremities:  No clubbing or edema.  No cyanosis. Neurologic:  Alert and oriented x3;  grossly normal neurologically. Skin:  Intact without significant lesions or rashes. No jaundice. Lymph Nodes:  No significant cervical adenopathy. Psych:  Alert and cooperative. Normal mood and affect.   Labs: CBC    Component Value Date/Time   WBC 5.3 08/11/2017 1022   RBC 4.41 08/11/2017 1022   HGB 13.0 08/11/2017 1022   HCT 40.1 08/11/2017 1022   PLT 492 (H) 08/11/2017 1022   MCV 91 08/11/2017 1022   MCH 29.5 08/11/2017 1022   MCHC 32.4 08/11/2017 1022   RDW 13.8 08/11/2017 1022   LYMPHSABS 1.6 08/11/2017 1022   EOSABS 0.1 08/11/2017 1022   BASOSABS 0.0 08/11/2017 1022   CMP     Component Value Date/Time   NA 143 08/11/2017 1022   K 4.9 08/11/2017 1022   CL 103 08/11/2017 1022   CO2 25 08/11/2017 1022   GLUCOSE 106 (H) 08/11/2017 1022   BUN 5 (L) 08/11/2017 1022   CREATININE 0.85 08/11/2017 1022   CALCIUM 9.6 08/11/2017 1022   PROT 7.3 08/11/2017 1022   ALBUMIN 4.3 08/11/2017 1022   AST 17 08/11/2017 1022   ALT 17 08/11/2017 1022   ALKPHOS 53 08/11/2017 1022   BILITOT 0.4 08/11/2017 1022   GFRNONAA 76 08/11/2017 1022   GFRAA 87 08/11/2017  1022    Imaging Studies: Mm 3d Screen Breast Bilateral  Result Date: 09/02/2017 CLINICAL DATA:  Screening. EXAM: DIGITAL SCREENING BILATERAL MAMMOGRAM WITH TOMO AND CAD COMPARISON:  Previous exam(s). ACR Breast Density Category b: There are scattered areas of fibroglandular density. FINDINGS: There are no findings suspicious for malignancy. Images were processed with CAD. IMPRESSION: No mammographic evidence of malignancy. A result letter of this screening mammogram will be mailed directly to the patient. RECOMMENDATION: Screening mammogram in one year. (Code:SM-B-01Y) BI-RADS CATEGORY  1: Negative. Electronically Signed   By: Gerome Samavid  Williams III M.D   On: 09/02/2017 09:20  Assessment and Plan:   Ardeth SportsmanConnie S Dagley is a 57 y.o. y/o female has been referred for unexplained weight loss and dysphasia  EGD indicated for dysphagia Colonoscopy indicated due to unexplained weight loss and intermittent right of blood per rectum  CBC is reassuring Normal TSH does not explain her weight loss No history of uncontrolled diabetes to explain weight loss either If EGD and colonoscopy are negative, primary care provider to look for other causes of weight loss  I have discussed alternative options, risks & benefits,  which include, but are not limited to, bleeding, infection, perforation,respiratory complication & drug reaction.  The patient agrees with this plan & written consent will be obtained.     Dr Denise BouillonVarnita Fox Salminen

## 2017-10-14 ENCOUNTER — Encounter: Payer: Self-pay | Admitting: *Deleted

## 2017-10-15 ENCOUNTER — Encounter: Payer: Self-pay | Admitting: *Deleted

## 2017-10-15 ENCOUNTER — Ambulatory Visit
Admission: RE | Admit: 2017-10-15 | Discharge: 2017-10-15 | Disposition: A | Discharge status: Home / Self Care | Payer: Managed Care, Other (non HMO) | Source: Ambulatory Visit | Attending: Gastroenterology | Admitting: Gastroenterology

## 2017-10-15 ENCOUNTER — Ambulatory Visit: Payer: Managed Care, Other (non HMO) | Admitting: Registered Nurse

## 2017-10-15 ENCOUNTER — Encounter
Admission: RE | Disposition: A | Discharge status: Home / Self Care | Payer: Self-pay | Source: Ambulatory Visit | Attending: Gastroenterology

## 2017-10-15 ENCOUNTER — Ambulatory Visit: Payer: Managed Care, Other (non HMO)

## 2017-10-15 DIAGNOSIS — Z79899 Other long term (current) drug therapy: Secondary | ICD-10-CM | POA: Insufficient documentation

## 2017-10-15 DIAGNOSIS — Z6824 Body mass index (BMI) 24.0-24.9, adult: Secondary | ICD-10-CM | POA: Insufficient documentation

## 2017-10-15 DIAGNOSIS — K2289 Other specified disease of esophagus: Secondary | ICD-10-CM

## 2017-10-15 DIAGNOSIS — R1310 Dysphagia, unspecified: Secondary | ICD-10-CM | POA: Diagnosis not present

## 2017-10-15 DIAGNOSIS — R634 Abnormal weight loss: Secondary | ICD-10-CM

## 2017-10-15 DIAGNOSIS — J45909 Unspecified asthma, uncomplicated: Secondary | ICD-10-CM | POA: Diagnosis not present

## 2017-10-15 DIAGNOSIS — K3189 Other diseases of stomach and duodenum: Secondary | ICD-10-CM

## 2017-10-15 DIAGNOSIS — Q402 Other specified congenital malformations of stomach: Secondary | ICD-10-CM | POA: Diagnosis not present

## 2017-10-15 DIAGNOSIS — E039 Hypothyroidism, unspecified: Secondary | ICD-10-CM | POA: Insufficient documentation

## 2017-10-15 DIAGNOSIS — Z7951 Long term (current) use of inhaled steroids: Secondary | ICD-10-CM | POA: Diagnosis not present

## 2017-10-15 DIAGNOSIS — Z79890 Hormone replacement therapy (postmenopausal): Secondary | ICD-10-CM | POA: Insufficient documentation

## 2017-10-15 DIAGNOSIS — K219 Gastro-esophageal reflux disease without esophagitis: Secondary | ICD-10-CM | POA: Insufficient documentation

## 2017-10-15 DIAGNOSIS — E785 Hyperlipidemia, unspecified: Secondary | ICD-10-CM | POA: Diagnosis not present

## 2017-10-15 DIAGNOSIS — K529 Noninfective gastroenteritis and colitis, unspecified: Secondary | ICD-10-CM | POA: Diagnosis not present

## 2017-10-15 DIAGNOSIS — K2980 Duodenitis without bleeding: Secondary | ICD-10-CM | POA: Diagnosis not present

## 2017-10-15 DIAGNOSIS — R1319 Other dysphagia: Secondary | ICD-10-CM

## 2017-10-15 DIAGNOSIS — K228 Other specified diseases of esophagus: Secondary | ICD-10-CM

## 2017-10-15 DIAGNOSIS — K5730 Diverticulosis of large intestine without perforation or abscess without bleeding: Secondary | ICD-10-CM | POA: Diagnosis not present

## 2017-10-15 HISTORY — PX: COLONOSCOPY WITH PROPOFOL: SHX5780

## 2017-10-15 HISTORY — DX: Unspecified asthma, uncomplicated: J45.909

## 2017-10-15 HISTORY — PX: ESOPHAGOGASTRODUODENOSCOPY (EGD) WITH PROPOFOL: SHX5813

## 2017-10-15 LAB — CREATININE, SERUM
Creatinine, Ser: 0.78 mg/dL (ref 0.44–1.00)
GFR calc Af Amer: >60 mL/min (ref >60)
GFR calc non Af Amer: >60 mL/min (ref >60)

## 2017-10-15 LAB — GLUCOSE, CAPILLARY: Glucose-Capillary: 111 mg/dL — ABNORMAL HIGH (ref 70–99)

## 2017-10-15 SURGERY — COLONOSCOPY WITH PROPOFOL
Anesthesia: General

## 2017-10-15 MED ORDER — IOHEXOL 300 MG/ML  SOLN
75.0000 mL | Freq: Once | INTRAMUSCULAR | Status: AC | PRN
  Administered 2017-10-15: 75 mL via INTRAVENOUS

## 2017-10-15 MED ORDER — MIDAZOLAM HCL 2 MG/2ML IJ SOLN
INTRAMUSCULAR | Status: DC | PRN
  Administered 2017-10-15: 2 mg via INTRAVENOUS

## 2017-10-15 MED ORDER — SODIUM CHLORIDE 0.9 % IV SOLN
INTRAVENOUS | Status: DC
  Administered 2017-10-15: 09:00:00 via INTRAVENOUS

## 2017-10-15 MED ORDER — PROPOFOL 500 MG/50ML IV EMUL
INTRAVENOUS | Status: AC
  Filled 2017-10-15: qty 50

## 2017-10-15 MED ORDER — PROPOFOL 500 MG/50ML IV EMUL
INTRAVENOUS | Status: DC | PRN
  Administered 2017-10-15: 150 ug/kg/min via INTRAVENOUS

## 2017-10-15 MED ORDER — FENTANYL CITRATE (PF) 100 MCG/2ML IJ SOLN
INTRAMUSCULAR | Status: AC
  Filled 2017-10-15: qty 2

## 2017-10-15 MED ORDER — PROPOFOL 10 MG/ML IV BOLUS
INTRAVENOUS | Status: DC | PRN
  Administered 2017-10-15: 80 mg via INTRAVENOUS

## 2017-10-15 MED ORDER — PROPOFOL 10 MG/ML IV BOLUS
INTRAVENOUS | Status: AC
  Filled 2017-10-15: qty 20

## 2017-10-15 MED ORDER — PHENYLEPHRINE HCL 10 MG/ML IJ SOLN
INTRAMUSCULAR | Status: DC | PRN
  Administered 2017-10-15: 200 ug via INTRAVENOUS

## 2017-10-15 MED ORDER — LIDOCAINE HCL (CARDIAC) PF 100 MG/5ML IV SOSY
PREFILLED_SYRINGE | INTRAVENOUS | Status: DC | PRN
  Administered 2017-10-15: 40 mg via INTRAVENOUS

## 2017-10-15 MED ORDER — FENTANYL CITRATE (PF) 100 MCG/2ML IJ SOLN
INTRAMUSCULAR | Status: DC | PRN
  Administered 2017-10-15: 25 ug via INTRAVENOUS

## 2017-10-15 MED ORDER — GLYCOPYRROLATE 0.2 MG/ML IJ SOLN
INTRAMUSCULAR | Status: DC | PRN
  Administered 2017-10-15: 0.2 mg via INTRAVENOUS

## 2017-10-15 MED ORDER — MIDAZOLAM HCL 2 MG/2ML IJ SOLN
INTRAMUSCULAR | Status: AC
  Filled 2017-10-15: qty 2

## 2017-10-15 NOTE — Anesthesia Postprocedure Evaluation (Signed)
Anesthesia Post Note  Patient: Denise Proctor  Procedure(s) Performed: COLONOSCOPY WITH PROPOFOL (N/A ) ESOPHAGOGASTRODUODENOSCOPY (EGD) WITH PROPOFOL (N/A )  Patient location during evaluation: Endoscopy Anesthesia Type: General Level of consciousness: awake and alert Pain management: pain level controlled Vital Signs Assessment: post-procedure vital signs reviewed and stable Respiratory status: spontaneous breathing, nonlabored ventilation, respiratory function stable and patient connected to nasal cannula oxygen Cardiovascular status: blood pressure returned to baseline and stable Postop Assessment: no apparent nausea or vomiting Anesthetic complications: no     Last Vitals:  Vitals:   10/15/17 0813 10/15/17 0958  BP: 131/81 102/73  Pulse: 94 95  Resp: 16 14  Temp: (!) 35.9 C (!) 36.2 C  SpO2: 100% 100%    Last Pain:  Vitals:   10/15/17 0958  TempSrc: Tympanic                 Lenard SimmerAndrew Clarece Drzewiecki

## 2017-10-15 NOTE — Op Note (Addendum)
[facility] Gastroenterology Patient Name: Denise Proctor Procedure Date: 10/15/2017 8:09 AM MRN: [id number] Account #: [id number] Date of Birth: 04/04/1960 Admit Type: Outpatient Age: 57 Room: [facility] ENDO ROOM 3 Gender: Female Note Status: Finalized Procedure:            Upper GI endoscopy Indications:          Dysphagia, Weight loss Providers:            Jo-Ann Johanning B. Maximino Greenland MD, MD Referring MD:         Gabriel Cirri (Referring MD) Medicines:            Monitored Anesthesia Care Complications:        No immediate complications. Procedure:            Pre-Anesthesia Assessment:                       - Prior to the procedure, a History and Physical was                        performed, and patient medications, allergies and                        sensitivities were reviewed. The patient's tolerance of                        previous anesthesia was reviewed.                       - The risks and benefits of the procedure and the                        sedation options and risks were discussed with the                        patient. All questions were answered and informed                        consent was obtained.                       - Patient identification and proposed procedure were                        verified prior to the procedure by the physician, the                        nurse, the anesthesiologist, the anesthetist and the                        technician. The procedure was verified in the procedure                        room.                       - ASA Grade Assessment: III - A patient with severe                        systemic disease.                       After  obtaining informed consent, the endoscope was                        passed under direct vision. Throughout the procedure,                        the patient's blood pressure, pulse, and oxygen                        saturations were monitored continuously. The Endoscope                         was introduced through the mouth, and advanced to the                        second part of duodenum. The upper GI endoscopy was                        accomplished with ease. The patient tolerated the                        procedure well. Findings:      Mucosal changes including circumferential folds were found in the entire       esophagus. Biopsies were obtained from the proximal and distal esophagus       with cold forceps for histology of suspected eosinophilic esophagitis.      Patchy mildly erythematous mucosa without bleeding was found in the       gastric antrum. Biopsies were taken with a cold forceps for histology.       Biopsies were obtained in the gastric body, at the incisura and in the       gastric antrum with cold forceps for histology.      Localized mild mucosal changes characterized by thickened folds were       found in the cardia. Biopsies were taken with a cold forceps for       histology.      Patchy mild mucosal changes characterized by polypoid appearance were       found in the duodenal bulb. Biopsies were taken with a cold forceps for       histology.      The second portion of the duodenum and examined duodenum were normal. Impression:           - Esophageal mucosal changes suggestive of eosinophilic                        esophagitis. Biopsied. (Initially esophagus was                        difficult to intubate likely due to esophageal spasm in                        the area at the time of scope insertion. The proximal                        esophagus was carefully examined on withdrawal and no                        narrowing or abnormalities seen. If biopsies are  negative can consider ENT referral vs modified barium                        swallow).                       - Erythematous mucosa in the antrum. Biopsied.                       - Thickened folds mucosa in the cardia. Biopsied. This                        is  likely to be normal on biopsies. The folds were                        hypertrophied but the mucosal pattern was normal.                       - Mucosal changes in the duodenum. Biopsied. 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                       - Normal second portion of the duodenum and examined                        duodenum.                       - Biopsies were obtained in the gastric body, at the                        incisura and in the gastric antrum. Recommendation:       - Await pathology results.                       - Discharge patient to home (with escort).                       - Advance diet as tolerated.                       - Continue present medications.                       - Patient has a contact number available for                        emergencies. The signs and symptoms of potential                        delayed complications were discussed with the patient.                        Return to normal activities tomorrow. Written discharge                        instructions were provided to the patient.                       - Discharge patient to home (with  escort).                       - The findings and recommendations were discussed with                        the patient.                       - The findings and recommendations were discussed with                        the patient's family. Procedure Code(s):    --- Professional ---                       [phone], Esophagogastroduodenoscopy, flexible, transoral;                        with biopsy, single or multiple Diagnosis Code(s):    --- Professional ---                       K22.8, Other specified diseases of esophagus                       K31.89, Other diseases of stomach and duodenum                       R13.10, Dysphagia, unspecified                       R63.4, Abnormal weight loss CPT copyright 2017 American Medical Association. All rights reserved. The  codes documented in this report are preliminary and upon coder review may  be revised to meet current compliance requirements.  Melodie BouillonVarnita Harvey Lingo, MD Michel BickersVarnita B. Maximino Greenlandahiliani MD, MD 10/15/2017 9:21:16 AM This report has been signed electronically. Number of Addenda: 0 Note Initiated On: 10/15/2017 8:09 AM Estimated Blood Loss: Estimated blood loss: none.      [facility]

## 2017-10-15 NOTE — Anesthesia Procedure Notes (Signed)
Date/Time: 10/15/2017 8:53 AM Performed by: Stormy Fabianurtis, Berthel Bagnall, CRNA Pre-anesthesia Checklist: Patient identified, Emergency Drugs available, Suction available and Patient being monitored Patient Re-evaluated:Patient Re-evaluated prior to induction Oxygen Delivery Method: Nasal cannula Induction Type: IV induction Dental Injury: Teeth and Oropharynx as per pre-operative assessment  Comments: Nasal cannula with etCO2 monitoring

## 2017-10-15 NOTE — Transfer of Care (Signed)
Immediate Anesthesia Transfer of Care Note  Patient: [name]  Procedure(s) Performed: Procedure(s): COLONOSCOPY WITH PROPOFOL (N/A) ESOPHAGOGASTRODUODENOSCOPY (EGD) WITH PROPOFOL (N/A)  Patient Location: PACU and Endoscopy Unit  Anesthesia Type:General  Level of Consciousness: sedated  Airway & Oxygen Therapy: Patient Spontanous Breathing and Patient connected to nasal cannula oxygen  Post-op Assessment: Report given to RN and Post -op Vital signs reviewed and stable  Post vital signs: Reviewed and stable  Last Vitals:  Vitals:   10/15/17 0813 10/15/17 0958  BP: 131/81 102/73  Pulse: 94 95  Resp: 16 14  Temp: (!) 35.9 C (!) 36.2 C  SpO2: 100% 100%    Complications: No apparent anesthesia complications

## 2017-10-15 NOTE — Op Note (Addendum)
[facility] Gastroenterology Patient Name: Paw Karstens Procedure Date: 10/15/2017 8:07 AM MRN: [id number] Account #: [id number] Date of Birth: 10/27/1960 Admit Type: Outpatient Age: 57 Room: [facility] ENDO ROOM 3 Gender: Female Note Status: Finalized Procedure:            Colonoscopy Indications:          Weight loss Providers:            Norely Schlick B. Maximino Greenland MD, MD Medicines:            Monitored Anesthesia Care Complications:        No immediate complications. Procedure:            Pre-Anesthesia Assessment:                       - Prior to the procedure, a History and Physical was                        performed, and patient medications, allergies and                        sensitivities were reviewed. The patient's tolerance of                        previous anesthesia was reviewed.                       - The risks and benefits of the procedure and the                        sedation options and risks were discussed with the                        patient. All questions were answered and informed                        consent was obtained.                       - Patient identification and proposed procedure were                        verified prior to the procedure by the physician, the                        nurse, the anesthetist and the technician. The                        procedure was verified in the pre-procedure area in the                        procedure room in the endoscopy suite.                       - ASA Grade Assessment: III - A patient with severe                        systemic disease.                       - After reviewing the risks and benefits, the patient  was deemed in satisfactory condition to undergo the                        procedure.                       After obtaining informed consent, the colonoscope was                        passed under direct vision. Throughout the procedure,      the patient's blood pressure, pulse, and oxygen                        saturations were monitored continuously. The                        Colonoscope was introduced through the anus and                        advanced to the the cecum, identified by appendiceal                        orifice and ileocecal valve. The colonoscopy was                        performed with ease. The patient tolerated the                        procedure well. The quality of the bowel preparation                        was fair. Findings:      The perianal and digital rectal examinations were normal.      A localized area of mildly inflamed mucosa was found appendiceal       orifice. Biopsies were taken with a cold forceps for histology. The       biopsies were taken of the erythema in the peri-appendiceal region and       not of the appendiceal orifice itself.      Multiple diverticula were found in the sigmoid colon.      The exam was otherwise without abnormality.      The rectum, sigmoid colon, descending colon, transverse colon, ascending       colon and cecum appeared normal.      The retroflexed view of the distal rectum and anal verge was normal and       showed no anal or rectal abnormalities. Impression:           - Preparation of the colon was fair.                       - Inflamed mucosa at the appendiceal orifice. Biopsied.                       - Diverticulosis in the sigmoid colon.                       - The examination was otherwise normal.                       - The rectum, sigmoid colon, descending colon,  transverse colon, ascending colon and cecum are normal.                       - The distal rectum and anal verge are normal on                        retroflexion view. Recommendation:       - Perform CT scan (computed tomography) of the abdomen                        with contrast today to evaluate for appendicitis.                       - Repeat colonoscopy  in 1 year for screening purposes                        with 2 day prep due to fair prep on today's exam.                       - Resume previous diet.                       - Discharge patient to home.                       - Continue present medications.                       - Return to primary care physician as previously                        scheduled.                       - The findings and recommendations were discussed with                        the patient.                       - The findings and recommendations were discussed with                        the patient's family.                       - High fiber diet. Procedure Code(s):    --- Professional ---                       [phone], Colonoscopy, flexible; with biopsy, single or                        multiple Diagnosis Code(s):    --- Professional ---                       K52.9, Noninfective gastroenteritis and colitis,                        unspecified                       R63.4, Abnormal weight loss  K57.30, Diverticulosis of large intestine without                        perforation or abscess without bleeding CPT copyright 2017 American Medical Association. All rights reserved. The codes documented in this report are preliminary and upon coder review may  be revised to meet current compliance requirements.  Melodie BouillonVarnita Jessicca Stitzer, MD Michel BickersVarnita B. Maximino Greenlandahiliani MD, MD 10/15/2017 9:58:21 AM This report has been signed electronically. Number of Addenda: 0 Note Initiated On: 10/15/2017 8:07 AM Scope Withdrawal Time: 0 hours 15 minutes 9 seconds  Total Procedure Duration: 0 hours 24 minutes 50 seconds  Estimated Blood Loss: Estimated blood loss: none.      [facility]

## 2017-10-15 NOTE — Anesthesia Preprocedure Evaluation (Signed)
Anesthesia Evaluation  Patient identified by MRN, date of birth, ID band Patient awake    Reviewed: Allergy & Precautions, H&P , NPO status , Patient's Chart, lab work & pertinent test results, reviewed documented beta blocker date and time   History of Anesthesia Complications Negative for: history of anesthetic complications  Airway Mallampati: III  TM Distance: >3 FB Neck ROM: full    Dental  (+) Upper Dentures, Dental Advidsory Given, Missing, Edentulous Upper   Pulmonary neg shortness of breath, asthma , neg sleep apnea, neg recent URI,           Cardiovascular Exercise Tolerance: Good negative cardio ROS       Neuro/Psych PSYCHIATRIC DISORDERS Anxiety Depression negative neurological ROS     GI/Hepatic Neg liver ROS, GERD  ,  Endo/Other  diabetes (Pre-diabetic)Hypothyroidism   Renal/GU negative Renal ROS  negative genitourinary   Musculoskeletal   Abdominal   Peds  Hematology negative hematology ROS (+)   Anesthesia Other Findings Past Medical History: No date: Anxiety No date: Depression No date: GERD (gastroesophageal reflux disease) No date: Hyperlipidemia No date: Osteopenia No date: Thyroid disease   Reproductive/Obstetrics negative OB ROS                             Anesthesia Physical Anesthesia Plan  ASA: II  Anesthesia Plan: General   Post-op Pain Management:    Induction: Intravenous  PONV Risk Score and Plan: 3 and Propofol infusion and TIVA  Airway Management Planned: Natural Airway and Nasal Cannula  Additional Equipment:   Intra-op Plan:   Post-operative Plan:   Informed Consent: I have reviewed the patients History and Physical, chart, labs and discussed the procedure including the risks, benefits and alternatives for the proposed anesthesia with the patient or authorized representative who has indicated his/her understanding and acceptance.    Dental Advisory Given  Plan Discussed with: Anesthesiologist, CRNA and Surgeon  Anesthesia Plan Comments:         Anesthesia Quick Evaluation

## 2017-10-15 NOTE — Anesthesia Post-op Follow-up Note (Signed)
Anesthesia QCDR form completed.        

## 2017-10-15 NOTE — H&P (Signed)
Melodie BouillonVarnita Tahiliani, MD [address], [address], [place], [place], [id number] [address], [address], [place], [place], [address] Phone: [phone]  Fax: [phone]  Primary Care Physician:  Gabriel CirriWicker, Cheryl, NP   Pre-Procedure History & Physical: HPI:  Ardeth SportsmanConnie S Dower is a 57 y.o. female is here for a colonoscopy and EGD.   Past Medical History:  Diagnosis Date  . Anxiety   . Depression   . GERD (gastroesophageal reflux disease)   . Hyperlipidemia   . Osteopenia   . Thyroid disease     Past Surgical History:  Procedure Laterality Date  . TUBAL LIGATION      Prior to Admission medications   Medication Sig Start Date End Date Taking? Authorizing Provider  levothyroxine (SYNTHROID, LEVOTHROID) 50 MCG tablet TAKE 1 TABLET BY MOUTH  DAILY 08/31/17  Yes Gabriel CirriWicker, Cheryl, NP  montelukast (SINGULAIR) 10 MG tablet TAKE 1 TABLET BY MOUTH AT  BEDTIME 03/09/17  Yes Gabriel CirriWicker, Cheryl, NP  PROAIR HFA 108 (90 Base) MCG/ACT inhaler USE 2 PUFFS EVERY 6 HOURS  AS NEEDED FOR WHEEZING OR  SHORTNESS OF BREATH 10/08/16  Yes Gabriel CirriWicker, Cheryl, NP  simvastatin (ZOCOR) 20 MG tablet TAKE 1 TABLET BY MOUTH  DAILY AT 6 PM. 08/11/17  Yes Gabriel CirriWicker, Cheryl, NP  SYMBICORT 160-4.5 MCG/ACT inhaler USE 2 PUFFS TWO TIMES DAILY 07/13/17  Yes Gabriel CirriWicker, Cheryl, NP  omeprazole (PRILOSEC) 20 MG capsule TAKE 1 CAPSULE BY MOUTH  DAILY 04/15/17   Olevia PerchesJohnson, Megan P, DO    Allergies as of 09/24/2017  . (No Known Allergies)    Family History  Problem Relation Age of Onset  . Hypertension Mother   . Hyperlipidemia Mother   . COPD Mother   . Heart disease Father   . Diabetes Maternal Grandmother   . Cancer Paternal Grandmother        stomach  . Stroke Paternal Grandfather   . Heart disease Paternal Grandfather   . Hyperlipidemia Brother   . Diverticulitis Brother   . Heart disease Maternal Grandfather   . Diverticulitis Brother     Social History   Socioeconomic History  . Marital status: Married    Spouse name: Not on file    . Number of children: Not on file  . Years of education: Not on file  . Highest education level: Not on file  Occupational History  . Not on file  Social Needs  . Financial resource strain: Not on file  . Food insecurity:    Worry: Not on file    Inability: Not on file  . Transportation needs:    Medical: Not on file    Non-medical: Not on file  Tobacco Use  . Smoking status: Never Smoker  . Smokeless tobacco: Never Used  Substance and Sexual Activity  . Alcohol use: No    Alcohol/week: 0.0 standard drinks  . Drug use: No  . Sexual activity: Yes  Lifestyle  . Physical activity:    Days per week: Not on file    Minutes per session: Not on file  . Stress: Not on file  Relationships  . Social connections:    Talks on phone: Not on file    Gets together: Not on file    Attends religious service: Not on file    Active member of club or organization: Not on file    Attends meetings of clubs or organizations: Not on file    Relationship status: Not on file  . Intimate partner violence:  Fear of current or ex partner: Not on file    Emotionally abused: Not on file    Physically abused: Not on file    Forced sexual activity: Not on file  Other Topics Concern  . Not on file  Social History Narrative  . Not on file    Review of Systems: See HPI, otherwise negative ROS  Physical Exam: BP 131/81   Pulse 94   Temp (!) 96.7 F (35.9 C) (Tympanic)   Resp 16   Ht 4\' 10"  (1.473 m)   Wt 54 kg   LMP 01/18/2015 (Approximate)   SpO2 100%   BMI 24.87 kg/m  General:   Alert,  pleasant and cooperative in NAD Head:  Normocephalic and atraumatic. Neck:  Supple; no masses or thyromegaly. Lungs:  Clear throughout to auscultation, normal respiratory effort.    Heart:  +S1, +S2, Regular rate and rhythm, No edema. Abdomen:  Soft, nontender and nondistended. Normal bowel sounds, without guarding, and without rebound.   Neurologic:  Alert and  oriented x4;  grossly normal  neurologically.  Impression/Plan: WILLENE HOLIAN is here for a colonoscopy to be performed for unexplained weight loss and EGD for dysphagia.  Risks, benefits, limitations, and alternatives regarding the procedures have been reviewed with the patient.  Questions have been answered.  All parties agreeable.   Pasty Spillers, MD  10/15/2017, 8:39 AM

## 2017-10-16 LAB — SURGICAL PATHOLOGY

## 2017-10-19 ENCOUNTER — Encounter: Payer: Self-pay | Admitting: Gastroenterology

## 2017-10-23 ENCOUNTER — Telehealth: Payer: Self-pay

## 2017-10-23 NOTE — Telephone Encounter (Signed)
Pt returned my call and was notified of results.

## 2017-10-23 NOTE — Telephone Encounter (Signed)
LVM for pt to return my call.

## 2017-10-23 NOTE — Telephone Encounter (Signed)
-----   Message from Pasty SpillersVarnita B Tahiliani, MD sent at 10/19/2017  4:00 PM EDT ----- Eunice Blaseebbie please let patient know, her biopsies were benign.  However, due to her problems swallowing she should follow-up with me in clinic to discuss further testing is needed.  Follow-up in clinic in 2 to 6 weeks.

## 2017-11-12 ENCOUNTER — Other Ambulatory Visit: Payer: Self-pay

## 2017-11-12 ENCOUNTER — Encounter: Payer: Self-pay | Admitting: Gastroenterology

## 2017-11-12 ENCOUNTER — Ambulatory Visit: Payer: Managed Care, Other (non HMO) | Admitting: Gastroenterology

## 2017-11-12 ENCOUNTER — Telehealth: Payer: Self-pay

## 2017-11-12 VITALS — BP 111/79 | HR 86 | Ht <= 58 in | Wt 120.0 lb

## 2017-11-12 DIAGNOSIS — R1310 Dysphagia, unspecified: Secondary | ICD-10-CM

## 2017-11-12 DIAGNOSIS — K529 Noninfective gastroenteritis and colitis, unspecified: Secondary | ICD-10-CM

## 2017-11-12 DIAGNOSIS — R1319 Other dysphagia: Secondary | ICD-10-CM

## 2017-11-12 NOTE — Telephone Encounter (Signed)
LVM for pt to provide her with appt information for her Modified Barium Swallow.  Modified Barium Swallow has been scheduled for  11/24/17 at [facility] arrival time 12:30 for 1pm appt. She will need to report to the medical mall.  No prep is necessary for barium swallow.  Thanks [employer]

## 2017-11-12 NOTE — Progress Notes (Signed)
Denise Bouillon, MD [address]  [address]  [place], [place] [address]  Main: [phone]  Fax: [phone]   Primary Care Physician: Gabriel Cirri, NP  Primary Gastroenterologist:  Dr. Melodie Proctor  Chief Complaint  Patient presents with  . Follow-up    Colonoscopy/EGD    HPI: Denise Proctor is a 56 y.o. female here for follow-up of dysphagia and weight loss.  Patient has undergone EGD and colonoscopy recently in September 2019.  EGD does not reveal an etiology of her dysphagia.  However, it is noted that "Initially esophagus was difficult to intubate likely due to esophageal spasm in the area at the time of scope insertion. The proximal esophagus was carefully examined on withdrawal and no narrowing or abnormalities seen. If biopsies are negative can consider ENT referral vs modified barium swallow".  Other than that, biopsies were not positive for eosinophilic esophagitis.  No narrowing or strictures were otherwise noted in the esophagus.  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  Colonoscopy showed localized area of mildly inflamed mucosa at the appendiceal orifice which was biopsied.  Diverticulosis was seen in the sigmoid colon.  Prep was fair.  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  Negative for chronicity.  CT abdomen pelvis was obtained on the same day to evaluate for appendicitis and did not report any evidence of appendicitis.  Patient continues to report dysphasia, with food getting stuck at the level of her mid neck intermittently.  She also reports chronic left lower quadrant pain for 30 years, dull, constant, nonradiating.  No fever chills.  Current Outpatient Medications  Medication Sig Dispense Refill  .  levothyroxine (SYNTHROID, LEVOTHROID) 50 MCG tablet TAKE 1 TABLET BY MOUTH  DAILY 90 tablet 1  . montelukast (SINGULAIR) 10 MG tablet TAKE 1 TABLET BY MOUTH AT  BEDTIME 90 tablet 3  . omeprazole (PRILOSEC) 20 MG capsule TAKE 1 CAPSULE BY MOUTH  DAILY 90 capsule 3  . PROAIR HFA 108 (90 Base) MCG/ACT inhaler USE 2 PUFFS EVERY 6 HOURS  AS NEEDED FOR WHEEZING OR  SHORTNESS OF BREATH 8.5 g 12  . simvastatin (ZOCOR) 20 MG tablet TAKE 1 TABLET BY MOUTH  DAILY AT 6 PM. 90 tablet 1  . SYMBICORT 160-4.5 MCG/ACT inhaler USE 2 PUFFS TWO TIMES DAILY 10.2 g 2   No current facility-administered medications for this visit.     Allergies as of 11/12/2017  . (No Known Allergies)    ROS:  General: Negative for anorexia, weight loss, fever, chills, fatigue, weakness. ENT: Negative for hoarseness, difficulty swallowing , nasal congestion. CV: Negative for chest pain, angina, palpitations, dyspnea on exertion, peripheral edema.  Respiratory: Negative for dyspnea at rest, dyspnea on exertion, cough, sputum, wheezing.  GI: See history of present illness. GU:  Negative for dysuria, hematuria, urinary incontinence, urinary frequency, nocturnal urination.  Endo: Negative for unusual weight change.    Physical Examination:   BP 111/79   Pulse 86   Ht 4\' 8"  (1.422 m)   Wt 120 lb (54.4 kg)   LMP 01/18/2015 (Approximate)   BMI 26.90 kg/m   General: Well-nourished, well-developed in no acute distress.  Eyes: No icterus. Conjunctivae pink. Mouth: Oropharyngeal mucosa moist and pink , no lesions erythema or exudate. Neck: Supple, Trachea midline Abdomen: Bowel sounds are normal, nontender,  nondistended, no hepatosplenomegaly or masses, no abdominal bruits or hernia , no rebound or guarding.   Extremities: No lower extremity edema. No clubbing or deformities. Neuro: Alert and oriented x 3.  Grossly intact. Skin: Warm and dry, no jaundice.   Psych: Alert and cooperative, normal mood and  affect.   Labs: CMP     Component Value Date/Time   NA 143 08/11/2017 1022   K 4.9 08/11/2017 1022   CL 103 08/11/2017 1022   CO2 25 08/11/2017 1022   GLUCOSE 106 (H) 08/11/2017 1022   BUN 5 (L) 08/11/2017 1022   CREATININE 0.78 10/15/2017 1017   CALCIUM 9.6 08/11/2017 1022   PROT 7.3 08/11/2017 1022   ALBUMIN 4.3 08/11/2017 1022   AST 17 08/11/2017 1022   ALT 17 08/11/2017 1022   ALKPHOS 53 08/11/2017 1022   BILITOT 0.4 08/11/2017 1022   GFRNONAA >60 10/15/2017 1017   GFRAA >60 10/15/2017 1017   Lab Results  Component Value Date   WBC 5.3 08/11/2017   HGB 13.0 08/11/2017   HCT 40.1 08/11/2017   MCV 91 08/11/2017   PLT 492 (H) 08/11/2017    Imaging Studies: Ct Abdomen Pelvis W Contrast  Result Date: 10/15/2017 CLINICAL DATA:  Patient had endoscopy and colonoscopy done this morning. Pt to CT for possible appendicitis. EXAM: CT ABDOMEN AND PELVIS WITH CONTRAST TECHNIQUE: Multidetector CT imaging of the abdomen and pelvis was performed using the standard protocol following bolus administration of intravenous contrast. CONTRAST:  75mL OMNIPAQUE IOHEXOL 300 MG/ML  SOLN COMPARISON:  None. FINDINGS: Lower chest: No acute abnormality. Hepatobiliary: Liver normal in size and overall attenuation. 4-5 mm low-density lesion the dome of segment 4A consistent with a cyst. No other liver masses or lesions. Normal gallbladder. No bile duct dilation. Pancreas: Unremarkable. No pancreatic ductal dilatation or surrounding inflammatory changes. Spleen: Normal in size without focal abnormality. Adrenals/Urinary Tract: Adrenal glands are unremarkable. Kidneys are normal, without renal calculi, focal lesion, or hydronephrosis. Bladder is unremarkable. Stomach/Bowel: Appendix extends medially and inferiorly from the cecal tip. It has a maximum diameter of 6 mm. There is no adjacent inflammatory stranding. Some fat is seen within the wall of the terminal ileum. There is prominent fat at the ileocecal  junction. Small bowel is normal in caliber. No wall thickening or convincing inflammation. Colon is mostly decompressed. There are scattered sigmoid diverticula. No diverticulitis. No colonic wall thickening or adjacent inflammation. Small sliding hiatal hernia.  Stomach otherwise unremarkable. Vascular/Lymphatic: No significant vascular findings are present. No enlarged abdominal or pelvic lymph nodes. Reproductive: Uterus and bilateral adnexa are unremarkable. Other: No abdominal wall hernia or abnormality. No abdominopelvic ascites. Musculoskeletal: No fracture or acute finding. No osteoblastic or osteolytic lesions. IMPRESSION: 1. CT findings are consistent with a normal appendix. Appendix measures 6 mm in diameter, which is top-normal in size. There is no periappendiceal inflammation. 2. No acute findings within the abdomen or pelvis. 3. Colon is mostly decompressed. No colonic inflammation. There are scattered sigmoid diverticula. Electronically Signed   By: Amie Portland M.D.   On: 10/15/2017 11:11    Assessment and Plan:   Denise Proctor is a 57 y.o. y/o female here for follow-up of dysphagia and weight loss  The resistance noted during intubation of the esophagus at the time of the EGD was only positive finding as far as her dysphagia is concerned I wonder if there is a cricopharyngeal bar that could explain her symptoms Therefore, will order modified barium swallow for further evaluation  If this is negative, can consider ENT referral versus manometry  Her appendiceal orifice inflammation is unlikely to be the cause of chronic constant pain ongoing for 30 years However, given that this positive finding was seen on colonoscopy, and biopsies reporting focal active colitis, will get surgical opinion to see if an appendectomy can relieve her symptoms  Repeat colonoscopy recommended in 1 year due to fair prep seen on last exam  Above discussed with patient she is agreeable with the plan    Dr  Denise Proctor

## 2017-11-20 ENCOUNTER — Telehealth: Payer: Self-pay

## 2017-11-20 NOTE — Telephone Encounter (Signed)
Left message that there was a referral made to [facility] and they contact you for your appointment. This was in regards to your inflammed appendix seen on your colonoscopy. The staff member thought she knew she was for a referral because she mentioned this in hospital after colonoscopy.  I also sent pt my chart message.

## 2017-11-20 NOTE — Telephone Encounter (Signed)
Pt calls because she had received a call from a Dr. Ermalene Searing office [facility] called her). They stated she missed her appt on 11/18/17 and she was not aware she had an appt., that they probably had the wrong person.

## 2017-11-23 NOTE — Telephone Encounter (Signed)
Pt sent my chart msg that she will call [facility].

## 2017-11-24 ENCOUNTER — Ambulatory Visit
Admission: RE | Admit: 2017-11-24 | Discharge: 2017-11-24 | Disposition: A | Discharge status: Home / Self Care | Payer: Managed Care, Other (non HMO) | Source: Ambulatory Visit | Attending: Gastroenterology | Admitting: Gastroenterology

## 2017-11-24 DIAGNOSIS — R1310 Dysphagia, unspecified: Secondary | ICD-10-CM | POA: Diagnosis present

## 2017-11-24 DIAGNOSIS — R1319 Other dysphagia: Secondary | ICD-10-CM

## 2017-11-24 DIAGNOSIS — Z9889 Other specified postprocedural states: Secondary | ICD-10-CM | POA: Diagnosis not present

## 2017-11-24 NOTE — Therapy (Addendum)
[facility] DIAGNOSTIC RADIOLOGY [address] [place], [place], [address] Phone: [phone]   Fax:     Modified Barium Swallow  Patient Details  Name: Denise Proctor MRN: [id number] Date of Birth: 10-Nov-1960 No data recorded  Encounter Date: 11/24/2017  End of Session - 11/24/17 1420    Visit Number  1    Number of Visits  1    Date for SLP Re-Evaluation  11/24/17    SLP Start Time  1300    SLP Stop Time   1400    SLP Time Calculation (min)  60 min    Activity Tolerance  Patient tolerated treatment well       Past Medical History:  Diagnosis Date  . Anxiety   . Asthma   . Depression   . GERD (gastroesophageal reflux disease)   . Hyperlipidemia   . Osteopenia   . Thyroid disease     Past Surgical History:  Procedure Laterality Date  . COLONOSCOPY WITH PROPOFOL N/A 10/15/2017   Procedure: COLONOSCOPY WITH PROPOFOL;  Surgeon: Pasty Spillers, MD;  Location: [facility] ENDOSCOPY;  Service: Endoscopy;  Laterality: N/A;  . ESOPHAGOGASTRODUODENOSCOPY (EGD) WITH PROPOFOL N/A 10/15/2017   Procedure: ESOPHAGOGASTRODUODENOSCOPY (EGD) WITH PROPOFOL;  Surgeon: Pasty Spillers, MD;  Location: [facility] ENDOSCOPY;  Service: Endoscopy;  Laterality: N/A;  . TUBAL LIGATION      There were no vitals filed for this visit.       Subjective: Patient behavior: (alertness, ability to follow instructions, etc.): pt c/o feelings of "bits of food coming back up when I cough" ~30 mins post meals. Pt also has feelings of food "getting caught" in her lower throat area. These issues have been increasing in intensity for pt w/ a "choking" episode 1x recently post a meal which greatly concerned pt. Pt had an EGD in 10/2017 which assessed the full Esophagus w/ no abnormalities noted(see EGD report for full detail).  Pt is A/O x3; on PPI for GERD. Native dentition w/ partial. OM exam WFL. Pt denied any Pulmonary disease or recent pneumonia; no Neurological dxs.  Recent weight loss per report. Chief complaint: dysphagia   Objective:  Radiological Procedure: A videoflouroscopic evaluation of oral-preparatory, reflex initiation, and pharyngeal phases of the swallow was performed; as well as a screening of the upper esophageal phase.  I. POSTURE: upright II. VIEW: lateral III. COMPENSATORY STRATEGIES: dry swallows b/t boluses of food or liquid IV. BOLUSES ADMINISTERED:  Thin Liquid: 4 tirals  Nectar-thick Liquid: 1 trial  Honey-thick Liquid: NT  Puree: 3 trials  Mechanical Soft: 1 trial V. RESULTS OF EVALUATION: A. ORAL PREPARATORY PHASE: (The lips, tongue, and velum are observed for strength and coordination)       **Overall Severity Rating: WFL. Pt exhibited adequate oral phase bolus management, mastication, and bolus cohesion w/ timely A-P transfer for swallowing. Oral clearing was appropriate post trial/swallowing.  B. SWALLOW INITIATION/REFLEX: (The reflex is normal if "triggered" by the time the bolus reached the base of the tongue)  **Overall Severity Rating: grossly WFL. Boluses (all consistencies) appeared to trigger the pharyngeal swallow at the level of the Valleculae; adequate airway closure noted during the swallow. No laryngeal Penetration or Aspiration was noted during bolus trials.   C. PHARYNGEAL PHASE: (Pharyngeal function is normal if the bolus shows rapid, smooth, and continuous transit through the pharynx and there is no pharyngeal residue after the swallow)  **Overall Severity Rating: grossly WFL. Pt exhibited initial pharyngeal clearing of bolus trial  material indicating adequate pharyngeal pressure and laryngeal excursion during the swallow. However, post complete swallowing, retrograde activity of bolus material from the Esophagus(the Zenker's Diverticulum) to the Pyriform Sinuses was noted. Pt was able to use a dry, f/u swallow to reduce/clear the Pyriform Sinus residue. This Retrograde activity was noted  frequently.  D. LARYNGEAL PENETRATION: (Material entering into the laryngeal inlet/vestibule but not aspirated): NONE E. ASPIRATION: NONE F. 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 Radiologist was consulted during this study and agreed w/ the observation of a Zenker's Diverticulum.  During the oral phase, pt exhibited adequate oral phase bolus management, mastication, and bolus cohesion w/ timely A-P transfer for swallowing. Oral clearing was appropriate post trial/swallowing. During the pharyngeal phase, boluses (all consistencies) appeared to trigger the pharyngeal swallow at the level of the Valleculae; adequate airway closure noted during the swallow. No laryngeal Penetration or Aspiration was noted during bolus trials. Pt exhibited initial pharyngeal clearing of bolus trial material indicating adequate pharyngeal pressure and laryngeal excursion during the swallow. However, post complete swallowing, retrograde activity of bolus material from the Esophagus(the Zenker's Diverticulum) to the Pyriform Sinuses was noted to occur. Pt was able to use a dry, f/u swallow to reduce/clear the Pyriform Sinus residue. This Retrograde activity was noted to occur frequently. It would align w/ pt's c/o feelings of "bits of food coming back up when I cough" ~30 mins post meals. Pt is at increased risk for choking/aspiration event from Retrograde bolus activity from the Esophagus to the Pyriform Sinuses. Pt is recommended to f/u w/ PCP/GI for appropriate referral for further assessment of this apparent Zenker's Diverticulum and possible treatment options available. Education was  given; pt gave verbal agreement.   PLAN/RECOMMENDATIONS:  A. Diet: continue current regular diet w/ thin liquids; f/u, dry swallows b/t bites/sips. Pills Whole in Puree IF needed for more effective clearing of the Esophagus when taking/swallowing  B. Swallowing Precautions: general aspiration and Reflux precautions  C. Recommended consultation to: ENT for further assessment/management of the Zenker's Diverticulum  D. Therapy recommendations: None  E. Results and recommendations were discussed w/ pt; video viewed and questions answered. Discussed general food preparation.           Esophageal dysphagia - Plan: DG Swallowing Func-Speech Pathology, DG Swallowing Func-Speech Pathology        Problem List Patient Active Problem List   Diagnosis Date Noted  . Cecitis   . Loss of weight   . Diverticulosis of large intestine without diverticulitis   . Problems with swallowing and mastication   . Stomach irritation   . Feline esophagus   . Unexplained weight loss 08/11/2017  . Vitamin D deficiency 07/02/2016  . Allergic rhinitis 06/29/2015  . Atrophic vaginitis 06/29/2015  . GERD (gastroesophageal reflux disease) 11/06/2014  . Hypothyroidism 11/06/2014  . Osteopenia 11/06/2014  . Hyperlipidemia 11/06/2014  . Asthma 11/06/2014      Jerilynn Som, MS, CCC-SLP Jazzie Trampe 11/24/2017, 2:23 PM  [facility] DIAGNOSTIC RADIOLOGY [address] [place], [place], [address] Phone: [phone]   Fax:     Name: Denise Proctor MRN: [id number] Date of Birth: [birth year]

## 2017-11-27 ENCOUNTER — Telehealth: Payer: Self-pay

## 2017-11-27 NOTE — Telephone Encounter (Signed)
Denise Proctor, please let the patient know that her modified barium swallow showed a Zenker's diverticulum, which is an outpouching in her esophagus, which can lead to food getting stuck in the outpouching itself.   We will refer her to the [facility] voice center who specializes in this.   Referral sent to [facility] voice center. I left message there that I was sending referral for pt and contact office if needed. I was unable to reach pt on work number, no answer and home number I left a message to check her my chart and to contact office if any questions.

## 2017-11-27 NOTE — Telephone Encounter (Signed)
Pt called and was told over phone and given number to [facility], she has already been contacted via voice mail.

## 2018-02-05 ENCOUNTER — Ambulatory Visit (INDEPENDENT_AMBULATORY_CARE_PROVIDER_SITE_OTHER): Payer: 59 | Admitting: Unknown Physician Specialty

## 2018-02-05 ENCOUNTER — Encounter: Payer: Self-pay | Admitting: Unknown Physician Specialty

## 2018-02-05 VITALS — BP 117/80 | HR 99 | Temp 98.5°F | Ht <= 58 in | Wt 114.9 lb

## 2018-02-05 DIAGNOSIS — R109 Unspecified abdominal pain: Secondary | ICD-10-CM | POA: Diagnosis not present

## 2018-02-05 DIAGNOSIS — R1032 Left lower quadrant pain: Secondary | ICD-10-CM

## 2018-02-05 DIAGNOSIS — R112 Nausea with vomiting, unspecified: Secondary | ICD-10-CM | POA: Diagnosis not present

## 2018-02-05 DIAGNOSIS — B001 Herpesviral vesicular dermatitis: Secondary | ICD-10-CM

## 2018-02-05 LAB — UA/M W/RFLX CULTURE, ROUTINE
Bilirubin, UA: NEGATIVE
Glucose, UA: NEGATIVE
Ketones, UA: NEGATIVE
Leukocytes, UA: NEGATIVE
Nitrite, UA: NEGATIVE
Protein, UA: NEGATIVE
Specific Gravity, UA: 1.01 (ref 1.005–1.030)
Urobilinogen, Ur: 0.2 mg/dL (ref 0.2–1.0)
pH, UA: 7 (ref 5.0–7.5)

## 2018-02-05 LAB — MICROSCOPIC EXAMINATION
Bacteria, UA: NONE SEEN
RBC, UA: NONE SEEN /hpf (ref 0–2)

## 2018-02-05 MED ORDER — VALACYCLOVIR HCL 1 G PO TABS: 2000.0000 mg | ORAL_TABLET | Freq: Two times a day (BID) | ORAL | 0 refills | Status: DC

## 2018-02-05 MED ORDER — ONDANSETRON HCL 4 MG PO TABS: 4.0000 mg | ORAL_TABLET | Freq: Three times a day (TID) | ORAL | 0 refills | Status: DC | PRN

## 2018-02-05 NOTE — Progress Notes (Signed)
BP 117/80 (BP Location: Left Arm, Patient Position: Sitting, Cuff Size: Normal)   Pulse 99   Temp 98.5 F (36.9 C) (Oral)   Ht 4' 9.25" (1.454 m)   Wt 114 lb 14.4 oz (52.1 kg)   LMP 01/18/2015 (Approximate)   SpO2 99%   BMI 24.65 kg/m    Subjective:    Patient ID: Denise Proctor, female    DOB: [birth year], 58 y.o.   MRN: [id number]  HPI: Denise SportsmanConnie S Sippel is a 58 y.o. female  Chief Complaint  Patient presents with  . Flank Pain    LLQ. Thinking possible kidney stone.   . Emesis  . Constipation    Patient was constipated, but had a BM this morning.    2 days ago pain in left lower quadrant.  Radiated from left flank to mid abdoman.  Describes it as an intermittent colicky pain.  Ibuprofen helped.  Constipation resolved this morning.  Vomiting started yesterday afternoon.  No fever but did have some chills.  No problems urinating.  No blood in urine  Cold sore-started yesterday lower lip.  States she gets them on occasion about twice a year  Relevant past medical, surgical, family and social history reviewed and updated as indicated. Interim medical history since our last visit reviewed. Allergies and medications reviewed and updated.  Review of Systems  Constitutional: Negative for fatigue, fever and unexpected weight change.  HENT: Negative.   Eyes: Negative.   Respiratory: Negative.   Cardiovascular: Negative.   Psychiatric/Behavioral: Negative.     Per HPI unless specifically indicated above     Objective:    BP 117/80 (BP Location: Left Arm, Patient Position: Sitting, Cuff Size: Normal)   Pulse 99   Temp 98.5 F (36.9 C) (Oral)   Ht 4' 9.25" (1.454 m)   Wt 114 lb 14.4 oz (52.1 kg)   LMP 01/18/2015 (Approximate)   SpO2 99%   BMI 24.65 kg/m   Wt Readings from Last 3 Encounters:  02/05/18 114 lb 14.4 oz (52.1 kg)  11/12/17 120 lb (54.4 kg)  10/15/17 119 lb (54 kg)    Physical Exam Constitutional:      General: She is not in acute distress.    Appearance:  Normal appearance. She is well-developed.  HENT:     Head: Normocephalic and atraumatic.  Eyes:     General: Lids are normal. No scleral icterus.       Right eye: No discharge.        Left eye: No discharge.     Conjunctiva/sclera: Conjunctivae normal.  Neck:     Musculoskeletal: Normal range of motion and neck supple.     Vascular: No carotid bruit or JVD.  Cardiovascular:     Rate and Rhythm: Normal rate and regular rhythm.     Heart sounds: Normal heart sounds.  Pulmonary:     Effort: Pulmonary effort is normal.     Breath sounds: Normal breath sounds.  Abdominal:     Palpations: There is no hepatomegaly or splenomegaly.  Musculoskeletal: Normal range of motion.  Skin:    General: Skin is warm and dry.     Coloration: Skin is not pale.     Findings: No rash.     Comments: erythemetous papule lower lip  Neurological:     Mental Status: She is alert and oriented to person, place, and time.  Psychiatric:        Behavior: Behavior normal.  Thought Content: Thought content normal.        Judgment: Judgment normal.      Urine is negative for red blood cells and white blood cells.  No bacteria Assessment & Plan:   Problem List Items Addressed This Visit    None    Visit Diagnoses    Flank pain    -  Primary   Relevant Orders   UA/M w/rflx Culture, Routine   Left lower quadrant abdominal pain       This is currently resolved.  ? passed kidney stone or stomach virus.  Encouraged to drink fluids and slowly add solid foods   Non-intractable vomiting with nausea, unspecified vomiting type       This is better with some nausea.  Slow advancement on diet.  Rx for nausea medications to help with food intake   Cold sore       Started yesterday.  Will rx Valtrex 2 grams BID for 1 day   Relevant Medications   valACYclovir (VALTREX) 1000 MG tablet        Follow up plan: Return if symptoms worsen or fail to improve.

## 2018-02-22 ENCOUNTER — Other Ambulatory Visit: Payer: Self-pay

## 2018-02-22 ENCOUNTER — Encounter: Payer: Self-pay | Admitting: Nurse Practitioner

## 2018-02-22 ENCOUNTER — Ambulatory Visit (INDEPENDENT_AMBULATORY_CARE_PROVIDER_SITE_OTHER): Payer: 59 | Admitting: Nurse Practitioner

## 2018-02-22 DIAGNOSIS — R21 Rash and other nonspecific skin eruption: Secondary | ICD-10-CM | POA: Insufficient documentation

## 2018-02-22 MED ORDER — CLOBETASOL PROPIONATE 0.05 % EX CREA: 1.0000 "application " | TOPICAL_CREAM | Freq: Two times a day (BID) | CUTANEOUS | 0 refills | Status: DC

## 2018-02-22 NOTE — Progress Notes (Signed)
BP 101/71   Pulse 88   Temp 98.5 F (36.9 C) (Oral)   Ht 4' 9.2" (1.453 m)   Wt 114 lb (51.7 kg)   LMP 01/18/2015 (Approximate)   SpO2 97%   BMI 24.50 kg/m    Subjective:    Patient ID: Denise Proctor, female    DOB: 07-02-1960, 58 y.o.   MRN: [id number]  HPI: Denise Proctor is a 58 y.o. female  Chief Complaint  Patient presents with  . Rash    ongoing for 2 weeks on left side buttucks. itchy and painful on and off. pt has tried OTC benadryl   RASH Initial presentation on January 6th.  She was treated 3 days prior with Valtrex for cold sores and states rash started after taking medication. Duration:  weeks  Location: left gluteus, left upper thigh, lower back, and slightly to groin Itching: yes Burning: burns only when itches it Redness: at first red, but this has improved Oozing: no Scaling: no Blisters: none Painful: no Fevers: no Change in detergents/soaps/personal care products: no Recent illness: no Recent travel:no History of same: no Context: better Alleviating factors: hydrocortisone cream Treatments attempted:hydrocortisone cream Shortness of breath: no  Throat/tongue swelling: no Myalgias/arthralgias: no  Relevant past medical, surgical, family and social history reviewed and updated as indicated. Interim medical history since our last visit reviewed. Allergies and medications reviewed and updated.  Review of Systems  Constitutional: Negative for activity change, appetite change, diaphoresis, fatigue and fever.  Respiratory: Negative for cough, chest tightness and shortness of breath.   Cardiovascular: Negative for chest pain, palpitations and leg swelling.  Gastrointestinal: Negative for abdominal distention, abdominal pain, constipation, diarrhea, nausea and vomiting.  Endocrine: Negative for cold intolerance, heat intolerance, polydipsia, polyphagia and polyuria.  Skin: Positive for rash.  Neurological: Negative for dizziness, syncope, weakness,  light-headedness, numbness and headaches.  Psychiatric/Behavioral: Negative.     Per HPI unless specifically indicated above     Objective:    BP 101/71   Pulse 88   Temp 98.5 F (36.9 C) (Oral)   Ht 4' 9.2" (1.453 m)   Wt 114 lb (51.7 kg)   LMP 01/18/2015 (Approximate)   SpO2 97%   BMI 24.50 kg/m   Wt Readings from Last 3 Encounters:  02/22/18 114 lb (51.7 kg)  02/05/18 114 lb 14.4 oz (52.1 kg)  11/12/17 120 lb (54.4 kg)    Physical Exam Vitals signs and nursing note reviewed.  Constitutional:      General: She is awake.     Appearance: She is well-developed.  HENT:     Head: Normocephalic.     Right Ear: Hearing normal.     Left Ear: Hearing normal.     Nose: Nose normal.     Mouth/Throat:     Mouth: Mucous membranes are moist.  Eyes:     General: Lids are normal.        Right eye: No discharge.        Left eye: No discharge.     Conjunctiva/sclera: Conjunctivae normal.     Pupils: Pupils are equal, round, and reactive to light.  Neck:     Musculoskeletal: Normal range of motion and neck supple.     Thyroid: No thyromegaly.     Vascular: No carotid bruit or JVD.  Cardiovascular:     Rate and Rhythm: Normal rate and regular rhythm.     Heart sounds: Normal heart sounds. No murmur. No gallop.   Pulmonary:  Effort: Pulmonary effort is normal.     Breath sounds: Normal breath sounds.  Abdominal:     General: Bowel sounds are normal.     Palpations: Abdomen is soft. There is no hepatomegaly or splenomegaly.  Musculoskeletal:     Right lower leg: No edema.     Left lower leg: No edema.  Lymphadenopathy:     Cervical: No cervical adenopathy.  Skin:    General: Skin is warm and dry.     Findings: Rash present.     Comments: Small, diffuse, patchy, papular flat pale pink rash noted to left gluteus, lower back, left thigh, and groin area.  No vesicles.  Patchy areas, some noted with white crusting around exterior.  Various stages of healing present.  Skin  intact.  Neurological:     Mental Status: She is alert and oriented to person, place, and time.  Psychiatric:        Attention and Perception: Attention normal.        Mood and Affect: Mood normal.        Behavior: Behavior normal. Behavior is cooperative.        Thought Content: Thought content normal.        Judgment: Judgment normal.     Results for orders placed or performed in visit on 02/05/18  Microscopic Examination  Result Value Ref Range   WBC, UA 0-5 0 - 5 /hpf   RBC, UA None seen 0 - 2 /hpf   Epithelial Cells (non renal) 0-10 0 - 10 /hpf   Bacteria, UA None seen None seen/Few  UA/M w/rflx Culture, Routine  Result Value Ref Range   Specific Gravity, UA 1.010 1.005 - 1.030   pH, UA 7.0 5.0 - 7.5   Color, UA Yellow Yellow   Appearance Ur Clear Clear   Leukocytes, UA Negative Negative   Protein, UA Negative Negative/Trace   Glucose, UA Negative Negative   Ketones, UA Negative Negative   RBC, UA 1+ (A) Negative   Bilirubin, UA Negative Negative   Urobilinogen, Ur 0.2 0.2 - 1.0 mg/dL   Nitrite, UA Negative Negative   Microscopic Examination See below:       Assessment & Plan:   Problem List Items Addressed This Visit      Musculoskeletal and Integument   Rash    Appears atopic dermatitis in presentation.  No vesicles.  Clobetasol script sent.  Recommend use of daily Zyrtec or Claritin until improvement.  Aveeno oatmeal soap at home, to continue this.  Return for worsening or continued symptoms.            Follow up plan: Return if symptoms worsen or fail to improve.

## 2018-02-22 NOTE — Assessment & Plan Note (Addendum)
Appears atopic dermatitis in presentation.  No vesicles.  Clobetasol script sent.  Recommend use of daily Zyrtec or Claritin until improvement.  Aveeno oatmeal soap at home, to continue this.  Return for worsening or continued symptoms.

## 2018-02-22 NOTE — Patient Instructions (Signed)

## 2018-03-10 ENCOUNTER — Other Ambulatory Visit: Payer: Self-pay | Admitting: Unknown Physician Specialty

## 2018-03-15 ENCOUNTER — Other Ambulatory Visit: Payer: Self-pay

## 2018-03-15 MED ORDER — MONTELUKAST SODIUM 10 MG PO TABS: 10.0000 mg | ORAL_TABLET | Freq: Every day | ORAL | 3 refills | Status: DC

## 2018-03-15 NOTE — Telephone Encounter (Signed)
Refill for Singulair approved.

## 2018-04-14 ENCOUNTER — Other Ambulatory Visit: Payer: Self-pay | Admitting: Unknown Physician Specialty

## 2018-04-14 DIAGNOSIS — E039 Hypothyroidism, unspecified: Secondary | ICD-10-CM

## 2018-05-19 ENCOUNTER — Other Ambulatory Visit: Payer: Self-pay | Admitting: Unknown Physician Specialty

## 2018-05-20 ENCOUNTER — Telehealth: Payer: Self-pay | Admitting: Gastroenterology

## 2018-05-20 NOTE — Telephone Encounter (Signed)
Error

## 2018-05-21 ENCOUNTER — Telehealth: Payer: Self-pay | Admitting: Gastroenterology

## 2018-05-21 NOTE — Telephone Encounter (Signed)
error 

## 2018-10-13 ENCOUNTER — Other Ambulatory Visit: Payer: Self-pay | Admitting: Family Medicine

## 2018-10-13 DIAGNOSIS — Z1231 Encounter for screening mammogram for malignant neoplasm of breast: Secondary | ICD-10-CM

## 2018-11-17 ENCOUNTER — Ambulatory Visit
Admission: RE | Admit: 2018-11-17 | Discharge: 2018-11-17 | Disposition: A | Discharge status: Home / Self Care | Payer: 59 | Source: Ambulatory Visit | Attending: Family Medicine | Admitting: Family Medicine

## 2018-11-17 DIAGNOSIS — Z1231 Encounter for screening mammogram for malignant neoplasm of breast: Secondary | ICD-10-CM | POA: Insufficient documentation

## 2019-01-05 ENCOUNTER — Other Ambulatory Visit: Payer: Self-pay

## 2019-01-05 DIAGNOSIS — E039 Hypothyroidism, unspecified: Secondary | ICD-10-CM

## 2019-01-05 MED ORDER — LEVOTHYROXINE SODIUM 50 MCG PO TABS: 50.0000 ug | ORAL_TABLET | Freq: Every day | ORAL | 0 refills | Status: DC

## 2019-01-05 NOTE — Telephone Encounter (Signed)
30 day supply will be sent, but needs office visit for thyroid labs and check

## 2019-01-06 ENCOUNTER — Other Ambulatory Visit: Payer: Self-pay | Admitting: Nurse Practitioner

## 2019-01-06 DIAGNOSIS — E039 Hypothyroidism, unspecified: Secondary | ICD-10-CM

## 2019-01-06 MED ORDER — LEVOTHYROXINE SODIUM 50 MCG PO TABS: 50.0000 ug | ORAL_TABLET | Freq: Every day | ORAL | 0 refills | Status: DC

## 2019-01-06 NOTE — Telephone Encounter (Signed)
Please get patient scheduled and let jolene know.

## 2019-01-06 NOTE — Telephone Encounter (Signed)
Pt scheduled for 01/12/19.

## 2019-01-12 ENCOUNTER — Ambulatory Visit: Payer: 59 | Admitting: Nurse Practitioner

## 2019-01-12 ENCOUNTER — Encounter: Payer: Self-pay | Admitting: Nurse Practitioner

## 2019-01-12 ENCOUNTER — Other Ambulatory Visit: Payer: Self-pay

## 2019-01-12 VITALS — BP 107/66 | HR 85 | Temp 99.0°F | Ht <= 58 in | Wt 111.4 lb

## 2019-01-12 DIAGNOSIS — R1310 Dysphagia, unspecified: Secondary | ICD-10-CM | POA: Diagnosis not present

## 2019-01-12 DIAGNOSIS — E039 Hypothyroidism, unspecified: Secondary | ICD-10-CM

## 2019-01-12 MED ORDER — LEVOTHYROXINE SODIUM 50 MCG PO TABS: 50.0000 ug | ORAL_TABLET | Freq: Every day | ORAL | 2 refills | Status: DC

## 2019-01-12 NOTE — Progress Notes (Signed)
BP 107/66   Pulse 85   Temp 99 F (37.2 C) (Oral)   Ht 4' 9.5" (1.461 m)   Wt 111 lb 6.4 oz (50.5 kg)   LMP 01/18/2015 (Approximate)   SpO2 98%   BMI 23.69 kg/m    Subjective:    Patient ID: Denise Proctor, female    DOB: 02-22-1960, 58 y.o.   MRN: [id number]  HPI: Denise Proctor is a 58 y.o. female  Chief Complaint  Patient presents with  . Hypothyroidism  . Results    patient would like to discuss barium swallow result with [facility] in care everywhere    HYPOTHYROIDISM Continues on Levothyroxine 50 MCG daily. Last July TSH 2.360. Thyroid control status:stable Satisfied with current treatment? yes Medication side effects: no Medication compliance: good compliance Etiology of hypothyroidism:  Recent dose adjustment:no Fatigue: yes Cold intolerance: no Heat intolerance: no Weight gain: no Weight loss: no Constipation: no Diarrhea/loose stools: no Palpitations: no Lower extremity edema: no Anxiety/depressed mood: yes   PROBLEMS WITH SWALLOWING: She is followed by GI and had barium swallow 08/04/2018.  Reviewed results with her and recommend she return to GI for follow-up visits.  She reports she is stable at this time and does not want to return until Covid numbers decrease.  She plans on follow-up with them in spring.  Relevant past medical, surgical, family and social history reviewed and updated as indicated. Interim medical history since our last visit reviewed. Allergies and medications reviewed and updated.  Review of Systems  Constitutional: Positive for fatigue (occasional). Negative for activity change, appetite change, diaphoresis and fever.  Respiratory: Negative for cough, chest tightness and shortness of breath.   Cardiovascular: Negative for chest pain, palpitations and leg swelling.  Gastrointestinal: Negative for abdominal distention, abdominal pain, constipation, diarrhea, nausea and vomiting.  Endocrine: Negative for cold intolerance and heat  intolerance.  Neurological: Negative for dizziness, syncope, weakness, light-headedness, numbness and headaches.  Psychiatric/Behavioral: The patient is nervous/anxious (occasional).     Per HPI unless specifically indicated above     Objective:    BP 107/66   Pulse 85   Temp 99 F (37.2 C) (Oral)   Ht 4' 9.5" (1.461 m)   Wt 111 lb 6.4 oz (50.5 kg)   LMP 01/18/2015 (Approximate)   SpO2 98%   BMI 23.69 kg/m   Wt Readings from Last 3 Encounters:  01/12/19 111 lb 6.4 oz (50.5 kg)  02/22/18 114 lb (51.7 kg)  02/05/18 114 lb 14.4 oz (52.1 kg)    Physical Exam Vitals signs and nursing note reviewed.  Constitutional:      General: She is awake. She is not in acute distress.    Appearance: She is well-developed. She is not ill-appearing.  HENT:     Head: Normocephalic.     Right Ear: Hearing normal.     Left Ear: Hearing normal.     Nose: Nose normal.     Mouth/Throat:     Mouth: Mucous membranes are moist.  Eyes:     General: Lids are normal.        Right eye: No discharge.        Left eye: No discharge.     Conjunctiva/sclera: Conjunctivae normal.     Pupils: Pupils are equal, round, and reactive to light.  Neck:     Musculoskeletal: Normal range of motion and neck supple.     Thyroid: No thyromegaly.     Vascular: No carotid bruit.  Cardiovascular:  Rate and Rhythm: Normal rate and regular rhythm.     Heart sounds: Normal heart sounds. No murmur. No gallop.   Pulmonary:     Effort: Pulmonary effort is normal. No accessory muscle usage or respiratory distress.     Breath sounds: Normal breath sounds.  Abdominal:     General: Bowel sounds are normal.     Palpations: Abdomen is soft.  Musculoskeletal:     Right lower leg: No edema.     Left lower leg: No edema.  Skin:    General: Skin is warm and dry.  Neurological:     Mental Status: She is alert and oriented to person, place, and time.  Psychiatric:        Attention and Perception: Attention normal.         Mood and Affect: Mood normal.        Behavior: Behavior normal. Behavior is cooperative.        Thought Content: Thought content normal.        Judgment: Judgment normal.     Results for orders placed or performed in visit on 02/05/18  Microscopic Examination   URINE  Result Value Ref Range   WBC, UA 0-5 0 - 5 /hpf   RBC, UA None seen 0 - 2 /hpf   Epithelial Cells (non renal) 0-10 0 - 10 /hpf   Bacteria, UA None seen None seen/Few  UA/M w/rflx Culture, Routine   Specimen: Urine   URINE  Result Value Ref Range   Specific Gravity, UA 1.010 1.005 - 1.030   pH, UA 7.0 5.0 - 7.5   Color, UA Yellow Yellow   Appearance Ur Clear Clear   Leukocytes, UA Negative Negative   Protein, UA Negative Negative/Trace   Glucose, UA Negative Negative   Ketones, UA Negative Negative   RBC, UA 1+ (A) Negative   Bilirubin, UA Negative Negative   Urobilinogen, Ur 0.2 0.2 - 1.0 mg/dL   Nitrite, UA Negative Negative   Microscopic Examination See below:       Assessment & Plan:   Problem List Items Addressed This Visit      Digestive   Problems with swallowing and mastication    Reviewed recent results and advised her to follow-up with GI.        Endocrine   Hypothyroidism - Primary    Chronic, ongoing.  Continue current medication regimen and adjust as needed.  Thyroid panel today.        Relevant Medications   levothyroxine (SYNTHROID) 50 MCG tablet   Other Relevant Orders   Thyroid Panel With TSH       Follow up plan: Return in about 5 months (around 06/12/2019) for Annual physical.

## 2019-01-12 NOTE — Assessment & Plan Note (Signed)
Reviewed recent results and advised her to follow-up with GI. 

## 2019-01-12 NOTE — Patient Instructions (Signed)
Hypothyroidism  Hypothyroidism is when the thyroid gland does not make enough of certain hormones (it is underactive). The thyroid gland is a small gland located in the lower front part of the neck, just in front of the windpipe (trachea). This gland makes hormones that help control how the body uses food for energy (metabolism) as well as how the heart and brain function. These hormones also play a role in keeping your bones strong. When the thyroid is underactive, it produces too little of the hormones thyroxine (T4) and triiodothyronine (T3). What are the causes? This condition may be caused by:  Hashimoto's disease. This is a disease in which the body's disease-fighting system (immune system) attacks the thyroid gland. This is the most common cause.  Viral infections.  Pregnancy.  Certain medicines.  Birth defects.  Past radiation treatments to the head or neck for cancer.  Past treatment with radioactive iodine.  Past exposure to radiation in the environment.  Past surgical removal of part or all of the thyroid.  Problems with a gland in the center of the brain (pituitary gland).  Lack of enough iodine in the diet. What increases the risk? You are more likely to develop this condition if:  You are female.  You have a family history of thyroid conditions.  You use a medicine called lithium.  You take medicines that affect the immune system (immunosuppressants). What are the signs or symptoms? Symptoms of this condition include:  Feeling as though you have no energy (lethargy).  Not being able to tolerate cold.  Weight gain that is not explained by a change in diet or exercise habits.  Lack of appetite.  Dry skin.  Coarse hair.  Menstrual irregularity.  Slowing of thought processes.  Constipation.  Sadness or depression. How is this diagnosed? This condition may be diagnosed based on:  Your symptoms, your medical history, and a physical exam.  Blood  tests. You may also have imaging tests, such as an ultrasound or MRI. How is this treated? This condition is treated with medicine that replaces the thyroid hormones that your body does not make. After you begin treatment, it may take several weeks for symptoms to go away. Follow these instructions at home:  Take over-the-counter and prescription medicines only as told by your health care provider.  If you start taking any new medicines, tell your health care provider.  Keep all follow-up visits as told by your health care provider. This is important. ? As your condition improves, your dosage of thyroid hormone medicine may change. ? You will need to have blood tests regularly so that your health care provider can monitor your condition. Contact a health care provider if:  Your symptoms do not get better with treatment.  You are taking thyroid replacement medicine and you: ? Sweat a lot. ? Have tremors. ? Feel anxious. ? Lose weight rapidly. ? Cannot tolerate heat. ? Have emotional swings. ? Have diarrhea. ? Feel weak. Get help right away if you have:  Chest pain.  An irregular heartbeat.  A rapid heartbeat.  Difficulty breathing. Summary  Hypothyroidism is when the thyroid gland does not make enough of certain hormones (it is underactive).  When the thyroid is underactive, it produces too little of the hormones thyroxine (T4) and triiodothyronine (T3).  The most common cause is Hashimoto's disease, a disease in which the body's disease-fighting system (immune system) attacks the thyroid gland. The condition can also be caused by viral infections, medicine, pregnancy, or past   radiation treatment to the head or neck.  Symptoms may include weight gain, dry skin, constipation, feeling as though you do not have energy, and not being able to tolerate cold.  This condition is treated with medicine to replace the thyroid hormones that your body does not make. This information  is not intended to replace advice given to you by your health care provider. Make sure you discuss any questions you have with your health care provider. Document Released: 01/20/2005 Document Revised: 01/02/2017 Document Reviewed: 12/31/2016 Elsevier Patient Education  2020 [employer]  

## 2019-01-12 NOTE — Assessment & Plan Note (Signed)
Chronic, ongoing.  Continue current medication regimen and adjust as needed.  Thyroid panel today. 

## 2019-01-13 LAB — THYROID PANEL WITH TSH
Free Thyroxine Index: 2.4 (ref 1.2–4.9)
T3 Uptake Ratio: 29 % (ref 24–39)
T4, Total: 8.3 ug/dL (ref 4.5–12.0)
TSH: 2.62 u[IU]/mL (ref 0.450–4.500)

## 2019-01-25 ENCOUNTER — Other Ambulatory Visit: Payer: Self-pay

## 2019-01-25 MED ORDER — SIMVASTATIN 20 MG PO TABS: ORAL_TABLET | ORAL | 1 refills | Status: DC

## 2019-01-25 NOTE — Telephone Encounter (Signed)
Patient last seen 01/12/19

## 2019-02-25 ENCOUNTER — Other Ambulatory Visit: Payer: Self-pay | Admitting: Nurse Practitioner

## 2019-02-25 MED ORDER — BUDESONIDE-FORMOTEROL FUMARATE 160-4.5 MCG/ACT IN AERO: INHALATION_SPRAY | RESPIRATORY_TRACT | 1 refills | Status: DC

## 2019-02-25 NOTE — Telephone Encounter (Signed)
Requested Prescriptions  Pending Prescriptions Disp Refills  . budesonide-formoterol (SYMBICORT) 160-4.5 MCG/ACT inhaler 30.6 g 1    Sig: USE 2 PUFFS BY MOUTH TWO  TIMES DAILY     Pulmonology:  Combination Products Passed - 02/25/2019 10:22 AM      Passed - Valid encounter within last 12 months    Recent Outpatient Visits          1 month ago Hypothyroidism, unspecified type   [facility] [place], Dorie Rank, NP   1 year ago Rash   [facility] [place], Lynn T, NP   1 year ago Flank pain   [facility] Gabriel Cirri, NP   1 year ago Gastroesophageal reflux disease, esophagitis presence not specified   [facility] Gabriel Cirri, NP   1 year ago Spasm of lumbar paraspinous muscle   [facility] [place], Megan P, DO      Future Appointments            In 4 months [place], Dorie Rank, NP [facility]

## 2019-02-25 NOTE — Telephone Encounter (Signed)
Medication Refill - Medication: SYMBICORT 160-4.5 MCG/ACT inhaler   Preferred Pharmacy (with phone number or street name):  [id number] [place], [place] - [address] AT [facility] [address] [facility] Phone:  [phone]  Fax:  [phone]       Agent: Please be advised that RX refills may take up to 3 business days. We ask that you follow-up with your pharmacy.

## 2019-03-09 ENCOUNTER — Other Ambulatory Visit: Payer: Self-pay | Admitting: Nurse Practitioner

## 2019-03-09 ENCOUNTER — Telehealth: Payer: Self-pay

## 2019-03-09 DIAGNOSIS — E039 Hypothyroidism, unspecified: Secondary | ICD-10-CM

## 2019-03-09 NOTE — Telephone Encounter (Signed)
Not not needed 

## 2019-03-14 ENCOUNTER — Other Ambulatory Visit: Payer: Self-pay | Admitting: Nurse Practitioner

## 2019-06-27 ENCOUNTER — Other Ambulatory Visit (HOSPITAL_COMMUNITY)
Admission: RE | Admit: 2019-06-27 | Discharge: 2019-06-27 | Disposition: A | Discharge status: Home / Self Care | Payer: 59 | Source: Ambulatory Visit | Attending: Nurse Practitioner | Admitting: Nurse Practitioner

## 2019-06-27 ENCOUNTER — Ambulatory Visit (INDEPENDENT_AMBULATORY_CARE_PROVIDER_SITE_OTHER): Payer: 59 | Admitting: Nurse Practitioner

## 2019-06-27 ENCOUNTER — Encounter: Payer: Self-pay | Admitting: Nurse Practitioner

## 2019-06-27 ENCOUNTER — Other Ambulatory Visit: Payer: Self-pay

## 2019-06-27 VITALS — BP 98/61 | HR 87 | Temp 99.1°F | Ht <= 58 in | Wt 115.4 lb

## 2019-06-27 DIAGNOSIS — Z0000 Encounter for general adult medical examination without abnormal findings: Secondary | ICD-10-CM | POA: Insufficient documentation

## 2019-06-27 DIAGNOSIS — E039 Hypothyroidism, unspecified: Secondary | ICD-10-CM

## 2019-06-27 DIAGNOSIS — E782 Mixed hyperlipidemia: Secondary | ICD-10-CM

## 2019-06-27 DIAGNOSIS — R1310 Dysphagia, unspecified: Secondary | ICD-10-CM | POA: Diagnosis not present

## 2019-06-27 DIAGNOSIS — J4530 Mild persistent asthma, uncomplicated: Secondary | ICD-10-CM | POA: Diagnosis not present

## 2019-06-27 DIAGNOSIS — K219 Gastro-esophageal reflux disease without esophagitis: Secondary | ICD-10-CM

## 2019-06-27 DIAGNOSIS — Z124 Encounter for screening for malignant neoplasm of cervix: Secondary | ICD-10-CM | POA: Diagnosis not present

## 2019-06-27 DIAGNOSIS — Z1211 Encounter for screening for malignant neoplasm of colon: Secondary | ICD-10-CM

## 2019-06-27 DIAGNOSIS — E559 Vitamin D deficiency, unspecified: Secondary | ICD-10-CM

## 2019-06-27 DIAGNOSIS — J3081 Allergic rhinitis due to animal (cat) (dog) hair and dander: Secondary | ICD-10-CM

## 2019-06-27 NOTE — Patient Instructions (Signed)
Asthma, Adult  Asthma is a long-term (chronic) condition in which the airways get tight and narrow. The airways are the breathing passages that lead from the nose and mouth down into the lungs. A person with asthma will have times when symptoms get worse. These are called asthma attacks. They can cause coughing, whistling sounds when you breathe (wheezing), shortness of breath, and chest pain. They can make it hard to breathe. There is no cure for asthma, but medicines and lifestyle changes can help control it. There are many things that can bring on an asthma attack or make asthma symptoms worse (triggers). Common triggers include:  Mold.  Dust.  Cigarette smoke.  Cockroaches.  Things that can cause allergy symptoms (allergens). These include animal skin flakes (dander) and pollen from trees or grass.  Things that pollute the air. These may include household cleaners, wood smoke, smog, or chemical odors.  Cold air, weather changes, and wind.  Crying or laughing hard.  Stress.  Certain medicines or drugs.  Certain foods such as dried fruit, potato chips, and grape juice.  Infections, such as a cold or the flu.  Certain medical conditions or diseases.  Exercise or tiring activities. Asthma may be treated with medicines and by staying away from the things that cause asthma attacks. Types of medicines may include:  Controller medicines. These help prevent asthma symptoms. They are usually taken every day.  Fast-acting reliever or rescue medicines. These quickly relieve asthma symptoms. They are used as needed and provide short-term relief.  Allergy medicines if your attacks are brought on by allergens.  Medicines to help control the body's defense (immune) system. Follow these instructions at home: Avoiding triggers in your home  Change your heating and air conditioning filter often.  Limit your use of fireplaces and wood stoves.  Get rid of pests (such as roaches and  mice) and their droppings.  Throw away plants if you see mold on them.  Clean your floors. Dust regularly. Use cleaning products that do not smell.  Have someone vacuum when you are not home. Use a vacuum cleaner with a HEPA filter if possible.  Replace carpet with wood, tile, or vinyl flooring. Carpet can trap animal skin flakes and dust.  Use allergy-proof pillows, mattress covers, and box spring covers.  Wash bed sheets and blankets every week in hot water. Dry them in a dryer.  Keep your bedroom free of any triggers.  Avoid pets and keep windows closed when things that cause allergy symptoms are in the air.  Use blankets that are made of polyester or cotton.  Clean bathrooms and kitchens with bleach. If possible, have someone repaint the walls in these rooms with mold-resistant paint. Keep out of the rooms that are being cleaned and painted.  Wash your hands often with soap and water. If soap and water are not available, use hand sanitizer.  Do not allow anyone to smoke in your home. General instructions  Take over-the-counter and prescription medicines only as told by your doctor. ? Talk with your doctor if you have questions about how or when to take your medicines. ? Make note if you need to use your medicines more often than usual.  Do not use any products that contain nicotine or tobacco, such as cigarettes and e-cigarettes. If you need help quitting, ask your doctor.  Stay away from secondhand smoke.  Avoid doing things outdoors when allergen counts are high and when air quality is low.  Wear a ski mask   when doing outdoor activities in the winter. The mask should cover your nose and mouth. Exercise indoors on cold days if you can.  Warm up before you exercise. Take time to cool down after exercise.  Use a peak flow meter as told by your doctor. A peak flow meter is a tool that measures how well the lungs are working.  Keep track of the peak flow meter's readings.  Write them down.  Follow your asthma action plan. This is a written plan for taking care of your asthma and treating your attacks.  Make sure you get all the shots (vaccines) that your doctor recommends. Ask your doctor about a flu shot and a pneumonia shot.  Keep all follow-up visits as told by your doctor. This is important. Contact a doctor if:  You have wheezing, shortness of breath, or a cough even while taking medicine to prevent attacks.  The mucus you cough up (sputum) is thicker than usual.  The mucus you cough up changes from clear or white to yellow, green, gray, or bloody.  You have problems from the medicine you are taking, such as: ? A rash. ? Itching. ? Swelling. ? Trouble breathing.  You need reliever medicines more than 2-3 times a week.  Your peak flow reading is still at 50-79% of your personal best after following the action plan for 1 hour.  You have a fever. Get help right away if:  You seem to be worse and are not responding to medicine during an asthma attack.  You are short of breath even at rest.  You get short of breath when doing very little activity.  You have trouble eating, drinking, or talking.  You have chest pain or tightness.  You have a fast heartbeat.  Your lips or fingernails start to turn blue.  You are light-headed or dizzy, or you faint.  Your peak flow is less than 50% of your personal best.  You feel too tired to breathe normally. Summary  Asthma is a long-term (chronic) condition in which the airways get tight and narrow. An asthma attack can make it hard to breathe.  Asthma cannot be cured, but medicines and lifestyle changes can help control it.  Make sure you understand how to avoid triggers and how and when to use your medicines. This information is not intended to replace advice given to you by your health care provider. Make sure you discuss any questions you have with your health care provider. Document Revised:  03/25/2018 Document Reviewed: 02/25/2016 Elsevier Patient Education  2020 [employer] Inc.  

## 2019-06-27 NOTE — Assessment & Plan Note (Signed)
Chronic, ongoing. Continue Prilosec and recommend she take this daily as ordered.  Mag level today.  Recommend she follow-up with GI as was recommended. 

## 2019-06-27 NOTE — Progress Notes (Signed)
BP 98/61   Pulse 87   Temp 99.1 F (37.3 C) (Oral)   Ht  (1.448 m)   Wt 115 lb 6.4 oz (52.3 kg)   LMP 01/18/2015 (Approximate)   SpO2 98%   BMI 24.97 kg/m    Subjective:    Patient ID: Denise Proctor, female    DOB: 04-20-1960, 59 y.o.   MRN: [id number]  HPI: Denise Proctor is a 59 y.o. female presenting on 06/27/2019 for comprehensive medical examination. Current medical complaints include:none  She currently lives with: self Menopausal Symptoms: no   HYPOTHYROIDISM Continues on Levothyroxine 50 MCG daily. Last July TSH 2.620. Thyroid control status:stable Satisfied with current treatment? yes Medication side effects: no Medication compliance: good compliance Etiology of hypothyroidism:  Recent dose adjustment:no Fatigue: yes Cold intolerance: no Heat intolerance: no Weight gain: no Weight loss: no Constipation: no Diarrhea/loose stools: no Palpitations: no Lower extremity edema: no Anxiety/depressed mood: yes   ASTHMA Takes Symbicort daily and uses Proair as needed + Singulair daily. Asthma status: stable Satisfied with current treatment?: yes Albuterol/rescue inhaler frequency: once every couple days -- exacerbated with pollen Dyspnea frequency: 1-2 times a week Wheezing frequency: none Cough frequency: 1-2 times a week Nocturnal symptom frequency: none Limitation of activity: no Current upper respiratory symptoms: no Triggers: pollen Failed/intolerant to following asthma meds: none Asthma meds in past: Advair Aerochamber/spacer use: no Visits to ER or Urgent Care in past year: no Pneumovax: Up to Date Influenza: Not up to Date   GERD Last saw GI in July 2020 -- [facility] providers wanted follow-up in 5 months.  She does continue to have issues with dysphagia -- gets choked at times on [employer]eps[employer] and foods, does not happen every time, happens about once a week.  Is not taking Prilosec daily.  Denies reflux.  She reports being told she had a little out pouch in  esophagus. GERD control status: uncontrolled  Satisfied with current treatment? yes Heartburn frequency: none Medication side effects: no  Medication compliance: stable Previous GERD medications: Dysphagia: occasional Odynophagia:  no Hematemesis: no Blood in stool: no EGD: yes  HYPERLIPIDEMIA Continues on Simvastatin daily. Hyperlipidemia status: good compliance Satisfied with current treatment?  yes Side effects:  no Medication compliance: good compliance Past cholesterol meds: none Supplements: none Aspirin:  no The 10-year ASCVD risk score Denman George DC Jr., et al., 2013) is: 2.3%   Values used to calculate the score:     Age: 6 years     Sex: Female     Is Non-Hispanic African American: No     Diabetic: No     Tobacco smoker: No     Systolic Blood Pressure: 98 mmHg     Is BP treated: No     HDL Cholesterol: 44 mg/dL     Total Cholesterol: 200 mg/dL Chest pain:  no Coronary artery disease:  no Family history CAD:  yes Family history early CAD:  no  Depression Screen done today and results listed below:  Depression screen [facility] 2/9 06/27/2019 08/11/2017 06/12/2017 07/02/2016 06/29/2015  Decreased Interest 0 0 0 0 0  Down, Depressed, Hopeless 1 0 0 0 0  PHQ - 2 Score 1 0 0 0 0  Altered sleeping - 0 - 1 -  Tired, decreased energy - 0 - 1 -  Change in appetite - 0 - 0 -  Feeling bad or failure about yourself  - 0 - 0 -  Trouble concentrating - 0 - 0 -  Moving slowly or fidgety/restless - 0 - 0 -  Suicidal thoughts - 0 - 0 -  PHQ-9 Score - 0 - 2 -    The patient does not have a history of falls. I did not complete a risk assessment for falls. A plan of care for falls was not documented.   Past Medical History:  Past Medical History:  Diagnosis Date  . Anxiety   . Asthma   . Depression   . GERD (gastroesophageal reflux disease)   . Hyperlipidemia   . Osteopenia   . Thyroid disease     Surgical History:  Past Surgical History:  Procedure Laterality Date  .  COLONOSCOPY WITH PROPOFOL N/A 10/15/2017   Procedure: COLONOSCOPY WITH PROPOFOL;  Surgeon: Virgel Manifold, MD;  Location: [facility] ENDOSCOPY;  Service: Endoscopy;  Laterality: N/A;  . ESOPHAGOGASTRODUODENOSCOPY (EGD) WITH PROPOFOL N/A 10/15/2017   Procedure: ESOPHAGOGASTRODUODENOSCOPY (EGD) WITH PROPOFOL;  Surgeon: Virgel Manifold, MD;  Location: [facility] ENDOSCOPY;  Service: Endoscopy;  Laterality: N/A;  . TUBAL LIGATION      Medications:  Current Outpatient Medications on File Prior to Visit  Medication Sig  . budesonide-formoterol (SYMBICORT) 160-4.5 MCG/ACT inhaler USE 2 PUFFS BY MOUTH TWO  TIMES DAILY  . levothyroxine (SYNTHROID) 50 MCG tablet TAKE 1 TABLET BY MOUTH  DAILY  . montelukast (SINGULAIR) 10 MG tablet TAKE 1 TABLET BY MOUTH EVERY NIGHT AT BEDTIME.  Marland Kitchen omeprazole (PRILOSEC) 20 MG capsule TAKE 1 CAPSULE BY MOUTH  DAILY  . PROAIR HFA 108 (90 Base) MCG/ACT inhaler USE 2 PUFFS EVERY 6 HOURS  AS NEEDED FOR WHEEZING OR  SHORTNESS OF BREATH  . simvastatin (ZOCOR) 20 MG tablet TAKE 1 TABLET BY MOUTH DAILY AT 6:00 PM   No current facility-administered medications on file prior to visit.    Allergies:  No Known Allergies  Social History:  Social History   Socioeconomic History  . Marital status: Married    Spouse name: Not on file  . Number of children: Not on file  . Years of education: Not on file  . Highest education level: Not on file  Occupational History  . Not on file  Tobacco Use  . Smoking status: Never Smoker  . Smokeless tobacco: Never Used  Substance and Sexual Activity  . Alcohol use: No    Alcohol/week: 0.0 standard drinks  . Drug use: No  . Sexual activity: Yes  Other Topics Concern  . Not on file  Social History Narrative  . Not on file   Social Determinants of Health   Financial Resource Strain:   . Difficulty of Paying Living Expenses:   Food Insecurity:   . Worried About Charity fundraiser in the Last Year:   . Arboriculturist in the Last  Year:   Transportation Needs:   . Film/video editor (Medical):   Marland Kitchen Lack of Transportation (Non-Medical):   Physical Activity:   . Days of Exercise per Week:   . Minutes of Exercise per Session:   Stress:   . Feeling of Stress :   Social Connections:   . Frequency of Communication with Friends and Family:   . Frequency of Social Gatherings with Friends and Family:   . Attends Religious Services:   . Active Member of Clubs or Organizations:   . Attends Archivist Meetings:   Marland Kitchen Marital Status:   Intimate Partner Violence:   . Fear of Current or Ex-Partner:   . Emotionally Abused:   .  Physically Abused:   . Sexually Abused:    Social History   Tobacco Use  Smoking Status Never Smoker  Smokeless Tobacco Never Used   Social History   Substance and Sexual Activity  Alcohol Use No  . Alcohol/week: 0.0 standard drinks    Family History:  Family History  Problem Relation Age of Onset  . Hypertension Mother   . Hyperlipidemia Mother   . COPD Mother   . Heart disease Father   . Diabetes Maternal Grandmother   . Cancer Paternal Grandmother        stomach  . Stroke Paternal Grandfather   . Heart disease Paternal Grandfather   . Hyperlipidemia Brother   . Diverticulitis Brother   . Heart disease Maternal Grandfather   . Diverticulitis Brother     Past medical history, surgical history, medications, allergies, family history and social history reviewed with patient today and changes made to appropriate areas of the chart.   Review of Systems - negative All other ROS negative except what is listed above and in the HPI.      Objective:    BP 98/61   Pulse 87   Temp 99.1 F (37.3 C) (Oral)   Ht 4\' 9"  (1.448 m)   Wt 115 lb 6.4 oz (52.3 kg)   LMP 01/18/2015 (Approximate)   SpO2 98%   BMI 24.97 kg/m   Wt Readings from Last 3 Encounters:  06/27/19 115 lb 6.4 oz (52.3 kg)  01/12/19 111 lb 6.4 oz (50.5 kg)  02/22/18 114 lb (51.7 kg)    Physical  Exam Constitutional:      General: She is awake. She is not in acute distress.    Appearance: She is well-developed. She is not ill-appearing.  HENT:     Head: Normocephalic and atraumatic.     Right Ear: Hearing, tympanic membrane, ear canal and external ear normal. No drainage.     Left Ear: Hearing, tympanic membrane, ear canal and external ear normal. No drainage.     Nose: Nose normal.     Right Sinus: No maxillary sinus tenderness or frontal sinus tenderness.     Left Sinus: No maxillary sinus tenderness or frontal sinus tenderness.     Mouth/Throat:     Mouth: Mucous membranes are moist.     Pharynx: Oropharynx is clear. Uvula midline. No pharyngeal swelling, oropharyngeal exudate or posterior oropharyngeal erythema.  Eyes:     General: Lids are normal.        Right eye: No discharge.        Left eye: No discharge.     Extraocular Movements: Extraocular movements intact.     Conjunctiva/sclera: Conjunctivae normal.     Pupils: Pupils are equal, round, and reactive to light.     Visual Fields: Right eye visual fields normal and left eye visual fields normal.  Neck:     Thyroid: No thyromegaly.     Vascular: No carotid bruit.     Trachea: Trachea normal.  Cardiovascular:     Rate and Rhythm: Normal rate and regular rhythm.     Heart sounds: Normal heart sounds. No murmur. No gallop.   Pulmonary:     Effort: Pulmonary effort is normal. No accessory muscle usage or respiratory distress.     Breath sounds: Normal breath sounds.  Chest:     Breasts:        Right: Normal.        Left: Normal.  Abdominal:     General:  Bowel sounds are normal.     Palpations: Abdomen is soft. There is no hepatomegaly or splenomegaly.     Tenderness: There is no abdominal tenderness.     Hernia: There is no hernia in the left inguinal area or right inguinal area.  Genitourinary:    Exam position: Lithotomy position.     Labia:        Right: No rash.        Left: No rash.      Urethra: No  prolapse or urethral pain.     Vagina: Normal.     Cervix: Friability present.     Adnexa: Right adnexa normal and left adnexa normal.     Rectum: Normal.     Comments: Declined chaperone.  Cervix anterior, mild erythema to vaginal walls, scant bleeding when obtaining pap sample. Musculoskeletal:        General: Normal range of motion.     Cervical back: Normal range of motion and neck supple.     Right lower leg: No edema.     Left lower leg: No edema.  Lymphadenopathy:     Head:     Right side of head: No submental, submandibular, tonsillar, preauricular or posterior auricular adenopathy.     Left side of head: No submental, submandibular, tonsillar, preauricular or posterior auricular adenopathy.     Cervical: No cervical adenopathy.     Upper Body:     Right upper body: No supraclavicular, axillary or pectoral adenopathy.     Left upper body: No supraclavicular, axillary or pectoral adenopathy.  Skin:    General: Skin is warm and dry.     Capillary Refill: Capillary refill takes less than 2 seconds.     Findings: No rash.  Neurological:     Mental Status: She is alert and oriented to person, place, and time.     Cranial Nerves: Cranial nerves are intact.     Gait: Gait is intact.     Deep Tendon Reflexes: Reflexes are normal and symmetric.     Reflex Scores:      Brachioradialis reflexes are 2+ on the right side and 2+ on the left side.      Patellar reflexes are 2+ on the right side and 2+ on the left side. Psychiatric:        Attention and Perception: Attention normal.        Mood and Affect: Mood normal.        Speech: Speech normal.        Behavior: Behavior normal. Behavior is cooperative.        Thought Content: Thought content normal.        Judgment: Judgment normal.    Results for orders placed or performed in visit on 01/12/19  Thyroid Panel With TSH  Result Value Ref Range   TSH 2.620 0.450 - 4.500 uIU/mL   T4, Total 8.3 4.5 - 12.0 ug/dL   T3 Uptake Ratio  29 24 - 39 %   Free Thyroxine Index 2.4 1.2 - 4.9      Assessment & Plan:   Problem List Items Addressed This Visit      Respiratory   Asthma    Chronic, ongoing.  Continue current medication regimen and adjust as needed based on symptoms.  Controlled at this time with no recent exacerbation.  CBC today.  Return in 6 months.      Relevant Orders   CBC with Differential/Platelet   Allergic rhinitis  Chronic, ongoing in presence of asthma.  Continue daily Singulair and adjust regimen as needed.        Digestive   GERD (gastroesophageal reflux disease)    Chronic, ongoing. Continue Prilosec and recommend she take this daily as ordered.  Mag level today.  Recommend she follow-up with GI as was recommended.      Relevant Orders   Comprehensive metabolic panel   Magnesium   Problems with swallowing and mastication    Reviewed recent results and advised her to follow-up with GI.        Endocrine   Hypothyroidism    Chronic, ongoing.  Continue current medication regimen and adjust as needed.  TSH today and if stable, check annually.      Relevant Orders   TSH     Other   Hyperlipidemia    Chronic, ongoing.  Continue statin and adjust dose as needed. Lipid panel today.      Relevant Orders   Lipid Panel w/o Chol/HDL Ratio   Vitamin D deficiency    Chronic, stable.  Continue daily supplement and adjust as needed.  Vit D level today.      Relevant Orders   VITAMIN D 25 Hydroxy (Vit-D Deficiency, Fractures)    Other Visit Diagnoses    Encounter for annual physical exam    -  Primary   Relevant Orders   Cytology - PAP   Colon cancer screening       GI referral placed   Relevant Orders   Ambulatory referral to Gastroenterology   Cervical cancer screening       Pap obtained and sent   Relevant Orders   Cytology - PAP       Follow up plan: Return in about 6 months (around 12/28/2019) for Thyroid, Asthma (with spirometry), HLD.   LABORATORY TESTING:  - Pap  smear: pap done  IMMUNIZATIONS:   - Tdap: Tetanus vaccination status reviewed: last tetanus booster within 10 years. - Influenza: Up to date - Pneumovax: Not applicable - Prevnar: Not applicable - HPV: Not applicable - Zostavax vaccine: Refused  SCREENING: -Mammogram: Up to date  - Colonoscopy: Ordered today  - Bone Density: Not applicable  -Hearing Test: Not applicable  -Spirometry: Not applicable   PATIENT COUNSELING:   Advised to take 1 mg of folate supplement per day if capable of pregnancy.   Sexuality: Discussed sexually transmitted diseases, partner selection, use of condoms, avoidance of unintended pregnancy  and contraceptive alternatives.   Advised to avoid cigarette smoking.  I discussed with the patient that most people either abstain from alcohol or drink within safe limits (<=14/week and <=4 drinks/occasion for males, <=7/weeks and <= 3 drinks/occasion for females) and that the risk for alcohol disorders and other health effects rises proportionally with the number of drinks per week and how often a drinker exceeds daily limits.  Discussed cessation/primary prevention of drug use and availability of treatment for abuse.   Diet: Encouraged to adjust caloric intake to maintain  or achieve ideal body weight, to reduce intake of dietary saturated fat and total fat, to limit sodium intake by avoiding high sodium foods and not adding table salt, and to maintain adequate dietary potassium and calcium preferably from fresh fruits, vegetables, and low-fat dairy products.    stressed the importance of regular exercise  Injury prevention: Discussed safety belts, safety helmets, smoke detector, smoking near bedding or upholstery.   Dental health: Discussed importance of regular tooth brushing, flossing, and dental visits.  NEXT PREVENTATIVE PHYSICAL DUE IN 1 YEAR. Return in about 6 months (around 12/28/2019) for Thyroid, Asthma (with spirometry), HLD.

## 2019-06-27 NOTE — Assessment & Plan Note (Signed)
Chronic, ongoing.  Continue current medication regimen and adjust as needed.  TSH today and if stable, check annually.

## 2019-06-27 NOTE — Assessment & Plan Note (Signed)
Chronic, ongoing.  Continue statin and adjust dose as needed. Lipid panel today. 

## 2019-06-27 NOTE — Assessment & Plan Note (Signed)
Reviewed recent results and advised her to follow-up with GI.

## 2019-06-27 NOTE — Assessment & Plan Note (Addendum)
Chronic, ongoing.  Continue current medication regimen and adjust as needed based on symptoms.  Controlled at this time with no recent exacerbation.  CBC today.  Return in 6 months.

## 2019-06-27 NOTE — Assessment & Plan Note (Signed)
Chronic, stable.  Continue daily supplement and adjust as needed.  Vit D level today.

## 2019-06-27 NOTE — Assessment & Plan Note (Signed)
Chronic, ongoing in presence of asthma.  Continue daily Singulair and adjust regimen as needed. 

## 2019-06-28 LAB — LIPID PANEL W/O CHOL/HDL RATIO
Cholesterol, Total: 194 mg/dL (ref 100–199)
HDL: 52 mg/dL (ref >39)
LDL Chol Calc (NIH): 115 mg/dL — ABNORMAL HIGH (ref 0–99)
Triglycerides: 154 mg/dL — ABNORMAL HIGH (ref 0–149)
VLDL Cholesterol Cal: 27 mg/dL (ref 5–40)

## 2019-06-28 LAB — CBC WITH DIFFERENTIAL/PLATELET
Basophils Absolute: 0 10*3/uL (ref 0.0–0.2)
Basos: 1 %
EOS (ABSOLUTE): 0.1 10*3/uL (ref 0.0–0.4)
Eos: 2 %
Hematocrit: 38.8 % (ref 34.0–46.6)
Hemoglobin: 13.2 g/dL (ref 11.1–15.9)
Immature Grans (Abs): 0 10*3/uL (ref 0.0–0.1)
Immature Granulocytes: 1 %
Lymphocytes Absolute: 1.3 10*3/uL (ref 0.7–3.1)
Lymphs: 29 %
MCH: 30.3 pg (ref 26.6–33.0)
MCHC: 34 g/dL (ref 31.5–35.7)
MCV: 89 fL (ref 79–97)
Monocytes Absolute: 0.5 10*3/uL (ref 0.1–0.9)
Monocytes: 12 %
Neutrophils Absolute: 2.4 10*3/uL (ref 1.4–7.0)
Neutrophils: 55 %
Platelets: 393 10*3/uL (ref 150–450)
RBC: 4.35 x10E6/uL (ref 3.77–5.28)
RDW: 12.7 % (ref 11.7–15.4)
WBC: 4.3 10*3/uL (ref 3.4–10.8)

## 2019-06-28 LAB — COMPREHENSIVE METABOLIC PANEL
ALT: 22 IU/L (ref 0–32)
AST: 21 IU/L (ref 0–40)
Albumin/Globulin Ratio: 1.5 (ref 1.2–2.2)
Albumin: 4.3 g/dL (ref 3.8–4.9)
Alkaline Phosphatase: 47 IU/L — ABNORMAL LOW (ref 48–121)
BUN/Creatinine Ratio: 13 (ref 12–28)
BUN: 10 mg/dL (ref 8–27)
Bilirubin Total: 0.4 mg/dL (ref 0.0–1.2)
CO2: 24 mmol/L (ref 20–29)
Calcium: 9.1 mg/dL (ref 8.7–10.3)
Chloride: 103 mmol/L (ref 96–106)
Creatinine, Ser: 0.77 mg/dL (ref 0.57–1.00)
GFR calc Af Amer: 97 mL/min/{1.73_m2} (ref >59)
GFR calc non Af Amer: 84 mL/min/{1.73_m2} (ref >59)
Globulin, Total: 2.9 g/dL (ref 1.5–4.5)
Glucose: 103 mg/dL — ABNORMAL HIGH (ref 65–99)
Potassium: 4.3 mmol/L (ref 3.5–5.2)
Sodium: 140 mmol/L (ref 134–144)
Total Protein: 7.2 g/dL (ref 6.0–8.5)

## 2019-06-28 LAB — MAGNESIUM: Magnesium: 2.2 mg/dL (ref 1.6–2.3)

## 2019-06-28 LAB — TSH: TSH: 2.32 u[IU]/mL (ref 0.450–4.500)

## 2019-06-28 LAB — VITAMIN D 25 HYDROXY (VIT D DEFICIENCY, FRACTURES): Vit D, 25-Hydroxy: 21.3 ng/mL — ABNORMAL LOW (ref 30.0–100.0)

## 2019-06-28 NOTE — Progress Notes (Signed)
Contacted via MyChartGood afternoon Ameila, your labs have returned. - CBC is normal - Kidney, liver, and electrolytes are in good ranges - Thyroid and magnesium levels are normal - Vitamin D level is on lower side, please ensure you are taking Vitamin D3 2000 units daily, which you can obtain over the counter.  This is good for bone health. - Cholesterol levels show elevation in LDL. The LDL is the bad cholesterol. Over time and in combination with inflammation and other factors, this contributes to plaque which in turn may lead to stroke and/or heart attack down the road. Sometimes high LDL is primarily genetic, and people might be eating all the right foods but still have high numbers. Other times, there is room for improvement in one's diet and eating healthier can bring this number down and potentially reduce one's risk of heart attack and/or stroke.   To reduce your LDL, Remember - more fruits and vegetables, more fish, and limit red meat and dairy products. More soy, nuts, beans, barley, lentils, oats and plant sterol ester enriched margarine instead of butter. I also encourage eliminating sugar and processed food. Remember, shop on the outside of the grocery store and visit your [employer]. If you would like to talk with me about dietary changes for your cholesterol, please let me know. We should recheck your cholesterol in 6 months. Any questions? Keep being awesome!! Kindest regards, Allanna Bresee

## 2019-06-29 LAB — CYTOLOGY - PAP
Comment: NEGATIVE
Diagnosis: NEGATIVE
High risk HPV: NEGATIVE

## 2019-06-30 NOTE — Progress Notes (Signed)
Contacted via MyChart

## 2019-07-08 ENCOUNTER — Other Ambulatory Visit: Payer: Self-pay | Admitting: Nurse Practitioner

## 2019-07-08 MED ORDER — ALBUTEROL SULFATE HFA 108 (90 BASE) MCG/ACT IN AERS: INHALATION_SPRAY | RESPIRATORY_TRACT | 1 refills | Status: DC

## 2019-07-08 NOTE — Telephone Encounter (Signed)
   Notes to clinic: Patient requesting a 90 day supply Please review for Qty   Requested Prescriptions  Pending Prescriptions Disp Refills   albuterol (VENTOLIN HFA) 108 (90 Base) MCG/ACT inhaler [Pharmacy Med Name: ALBUTEROL HFA INH (200 PUFFS)8.5GM] 25.5 g     Sig: INHALE 2 PUFFS BY MOUTH EVERY 6 HOURS AS NEEDED FOR WHEEZING OR SHORTNESS OF BREATH      Pulmonology:  Beta [employer]onists Failed - 07/08/2019 10:49 AM      Failed - One inhaler should last at least one month. If the patient is requesting refills earlier, contact the patient to check for uncontrolled symptoms.      Passed - Valid encounter within last 12 months    Recent Outpatient Visits           1 week ago Encounter for annual physical exam   [facility] [place], Corrie Dandy T, NP   5 months ago Hypothyroidism, unspecified type   [facility] [place], Dorie Rank, NP   1 year ago Rash   [facility] [place], [place] T, NP   1 year ago Flank pain   [facility] Gabriel Cirri, NP   1 year ago Gastroesophageal reflux disease, esophagitis presence not specified   [facility] Gabriel Cirri, NP       Future Appointments             In 5 months Cannady, Dorie Rank, NP [facility]

## 2019-07-08 NOTE — Telephone Encounter (Signed)
Pt request refill   PROAIR HFA 108 (90 Base) MCG/ACT inhaler  Pt states she needs once in a while and thought Jolene filled at visit 06/27/19.  [id number] [place], [place] - [address] AT [facility] OF [address] & [address] [facility] Phone:  [phone]  Fax:  [phone]

## 2019-07-25 ENCOUNTER — Other Ambulatory Visit: Payer: Self-pay | Admitting: Nurse Practitioner

## 2019-08-22 ENCOUNTER — Other Ambulatory Visit: Payer: Self-pay | Admitting: Nurse Practitioner

## 2019-10-05 ENCOUNTER — Other Ambulatory Visit: Payer: Self-pay | Admitting: Nurse Practitioner

## 2019-10-05 DIAGNOSIS — E039 Hypothyroidism, unspecified: Secondary | ICD-10-CM

## 2019-10-29 ENCOUNTER — Other Ambulatory Visit: Payer: Self-pay | Admitting: Nurse Practitioner

## 2019-11-26 ENCOUNTER — Other Ambulatory Visit: Payer: Self-pay | Admitting: Nurse Practitioner

## 2019-11-26 NOTE — Telephone Encounter (Signed)
Requested Prescriptions  Pending Prescriptions Disp Refills   budesonide-formoterol (SYMBICORT) 160-4.5 MCG/ACT inhaler [Pharmacy Med Name: SYMBICORT 160/4. (120 ORAL INH)] 30.6 g 1    Sig: INHALE 2 PUFFS BY MOUTH TWICE DAILY     Pulmonology:  [employer] Passed - 11/26/2019  8:09 AM      Passed - Valid encounter within last 12 months    Recent Outpatient Visits          5 months ago Encounter for annual physical exam   [facility] [place], Corrie Dandy T, NP   10 months ago Hypothyroidism, unspecified type   [facility] [place], Dorie Rank, NP   1 year ago Rash   [facility] [place], [place] T, NP   1 year ago Flank pain   [facility] [place], Elnita Maxwell, NP   2 years ago Gastroesophageal reflux disease, esophagitis presence not specified   [facility] Gabriel Cirri, NP      Future Appointments            In 1 month Cannady, Dorie Rank, NP [facility]

## 2019-12-15 ENCOUNTER — Encounter: Payer: Self-pay | Admitting: Nurse Practitioner

## 2019-12-16 ENCOUNTER — Other Ambulatory Visit: Payer: Self-pay | Admitting: Nurse Practitioner

## 2019-12-28 ENCOUNTER — Ambulatory Visit: Payer: 59 | Admitting: Nurse Practitioner

## 2020-01-04 ENCOUNTER — Other Ambulatory Visit: Payer: Self-pay

## 2020-01-04 ENCOUNTER — Encounter: Payer: Self-pay | Admitting: Nurse Practitioner

## 2020-01-04 ENCOUNTER — Ambulatory Visit (INDEPENDENT_AMBULATORY_CARE_PROVIDER_SITE_OTHER): Payer: No Typology Code available for payment source | Admitting: Nurse Practitioner

## 2020-01-04 VITALS — BP 107/79 | HR 92 | Temp 98.9°F | Wt 117.2 lb

## 2020-01-04 DIAGNOSIS — J4530 Mild persistent asthma, uncomplicated: Secondary | ICD-10-CM

## 2020-01-04 DIAGNOSIS — Z1211 Encounter for screening for malignant neoplasm of colon: Secondary | ICD-10-CM

## 2020-01-04 DIAGNOSIS — E782 Mixed hyperlipidemia: Secondary | ICD-10-CM

## 2020-01-04 DIAGNOSIS — E039 Hypothyroidism, unspecified: Secondary | ICD-10-CM | POA: Diagnosis not present

## 2020-01-04 MED ORDER — SIMVASTATIN 20 MG PO TABS
ORAL_TABLET | ORAL | 4 refills | Status: DC
Start: 2020-01-04 — End: 2020-07-04

## 2020-01-04 MED ORDER — BUDESONIDE-FORMOTEROL FUMARATE 160-4.5 MCG/ACT IN AERO: INHALATION_SPRAY | RESPIRATORY_TRACT | 4 refills | Status: DC

## 2020-01-04 MED ORDER — MONTELUKAST SODIUM 10 MG PO TABS
10.0000 mg | ORAL_TABLET | Freq: Every day | ORAL | 4 refills | Status: DC
Start: 2020-01-04 — End: 2020-07-04

## 2020-01-04 MED ORDER — LEVOTHYROXINE SODIUM 50 MCG PO TABS: ORAL_TABLET | ORAL | 4 refills | Status: DC

## 2020-01-04 NOTE — Assessment & Plan Note (Signed)
Chronic, ongoing.  Continue current medication regimen and adjust as needed.  TSH stable on recent labs, recheck next visit at physical.

## 2020-01-04 NOTE — Progress Notes (Signed)
BP 107/79   Pulse 92   Temp 98.9 F (37.2 C)   Wt 117 lb 3.2 oz (53.2 kg)   LMP 01/18/2015 (Approximate)   SpO2 97%   BMI 25.36 kg/m    Subjective:    Patient ID: Denise Proctor, female    DOB: Apr 09, 1960, 59 y.o.   MRN: [id number]  HPI: Denise Proctor is a 59 y.o. female  Chief Complaint  Patient presents with  . Hypothyroidism  . Asthma   HYPOTHYROIDISM Continues on Levothyroxine 50 MCG daily. Last May 2021 -- 2.320. Thyroid control status:stable Satisfied with current treatment?yes Medication side effects:no Medication compliance:good compliance Etiology of hypothyroidism:  Recent dose adjustment:no Fatigue:yes Cold intolerance:no Heat intolerance:no Weight gain:no Weight loss:no Constipation:no Diarrhea/loose stools:no Palpitations:no Lower extremity edema:no Anxiety/depressed mood:yes  ASTHMA Takes Symbicort daily and uses Proair as needed + Singulair daily.  FEV1 81% and FEV1/FVC 99% today. Asthma status: stable Satisfied with current treatment?: yes Albuterol/rescue inhaler frequency: once every couple days -- a little more with pollen Dyspnea frequency: none Wheezing frequency: none Cough frequency: 1-2 times a week Nocturnal symptom frequency: none Limitation of activity: no Current upper respiratory symptoms: no Triggers: pollen Failed/intolerant to following asthma meds: none Asthma meds in past: Advair Aerochamber/spacer use: no Visits to ER or Urgent Care in past year: no Pneumovax: Up to Date Influenza: Up to Date   HYPERLIPIDEMIA Continues on Simvastatin. Last LDL was 115 in May 2021 -- was not fasting and is not today. Hyperlipidemia status: good compliance Satisfied with current treatment?  yes Side effects:  no Medication compliance: good compliance Supplements: none Aspirin:  no The 10-year ASCVD risk score Denman George DC Jr., et al., 2013) is: 2.6%   Values used to calculate the score:     Age: 58 years     Sex:  Female     Is Non-Hispanic African American: No     Diabetic: No     Tobacco smoker: No     Systolic Blood Pressure: 107 mmHg     Is BP treated: No     HDL Cholesterol: 52 mg/dL     Total Cholesterol: 194 mg/dL Chest pain:  no Coronary artery disease:  no Family history CAD:  yes Family history early CAD:  no  Relevant past medical, surgical, family and social history reviewed and updated as indicated. Interim medical history since our last visit reviewed. Allergies and medications reviewed and updated.  Review of Systems  Constitutional: Negative for activity change, appetite change, diaphoresis, fatigue and fever.  Respiratory: Negative for cough, chest tightness and shortness of breath.   Cardiovascular: Negative for chest pain, palpitations and leg swelling.  Gastrointestinal: Negative.   Endocrine: Negative for cold intolerance and heat intolerance.  Neurological: Negative.   Psychiatric/Behavioral: Negative.     Per HPI unless specifically indicated above     Objective:    BP 107/79   Pulse 92   Temp 98.9 F (37.2 C)   Wt 117 lb 3.2 oz (53.2 kg)   LMP 01/18/2015 (Approximate)   SpO2 97%   BMI 25.36 kg/m   Wt Readings from Last 3 Encounters:  01/04/20 117 lb 3.2 oz (53.2 kg)  06/27/19 115 lb 6.4 oz (52.3 kg)  01/12/19 111 lb 6.4 oz (50.5 kg)    Physical Exam Vitals and nursing note reviewed.  Constitutional:      General: She is awake. She is not in acute distress.    Appearance: She is well-developed. She is not ill-appearing.  HENT:     Head: Normocephalic.     Right Ear: Hearing normal.     Left Ear: Hearing normal.     Nose: Nose normal.     Mouth/Throat:     Mouth: Mucous membranes are moist.  Eyes:     General: Lids are normal.        Right eye: No discharge.        Left eye: No discharge.     Conjunctiva/sclera: Conjunctivae normal.     Pupils: Pupils are equal, round, and reactive to light.  Neck:     Thyroid: No thyromegaly.     Vascular:  No carotid bruit.  Cardiovascular:     Rate and Rhythm: Normal rate and regular rhythm.     Heart sounds: Normal heart sounds. No murmur heard.  No gallop.   Pulmonary:     Effort: Pulmonary effort is normal. No accessory muscle usage or respiratory distress.     Breath sounds: Normal breath sounds.  Abdominal:     General: Bowel sounds are normal.     Palpations: Abdomen is soft.  Musculoskeletal:     Cervical back: Normal range of motion and neck supple.     Right lower leg: No edema.     Left lower leg: No edema.  Skin:    General: Skin is warm and dry.  Neurological:     Mental Status: She is alert and oriented to person, place, and time.  Psychiatric:        Attention and Perception: Attention normal.        Mood and Affect: Mood normal.        Behavior: Behavior normal. Behavior is cooperative.        Thought Content: Thought content normal.        Judgment: Judgment normal.    Results for orders placed or performed in visit on 06/27/19  CBC with Differential/Platelet  Result Value Ref Range   WBC 4.3 3.4 - 10.8 x10E3/uL   RBC 4.35 3.77 - 5.28 x10E6/uL   Hemoglobin 13.2 11.1 - 15.9 g/dL   Hematocrit 63.3 35.4 - 46.6 %   MCV 89 79 - 97 fL   MCH 30.3 26.6 - 33.0 pg   MCHC 34.0 31 - 35 g/dL   RDW 56.2 56.3 - 89.3 %   Platelets 393 150 - 450 x10E3/uL   Neutrophils 55 Not Estab. %   Lymphs 29 Not Estab. %   Monocytes 12 Not Estab. %   Eos 2 Not Estab. %   Basos 1 Not Estab. %   Neutrophils Absolute 2.4 1.40 - 7.00 x10E3/uL   Lymphocytes Absolute 1.3 0 - 3 x10E3/uL   Monocytes Absolute 0.5 0 - 0 x10E3/uL   EOS (ABSOLUTE) 0.1 0.0 - 0.4 x10E3/uL   Basophils Absolute 0.0 0 - 0 x10E3/uL   Immature Granulocytes 1 Not Estab. %   Immature Grans (Abs) 0.0 0.0 - 0.1 x10E3/uL  Comprehensive metabolic panel  Result Value Ref Range   Glucose 103 (H) 65 - 99 mg/dL   BUN 10 8 - 27 mg/dL   Creatinine, Ser 7.34 0.57 - 1.00 mg/dL   GFR calc non Af Amer 84 >59 mL/min/1.73   GFR  calc Af Amer 97 >59 mL/min/1.73   BUN/Creatinine Ratio 13 12 - 28   Sodium 140 134 - 144 mmol/L   Potassium 4.3 3.5 - 5.2 mmol/L   Chloride 103 96 - 106 mmol/L   CO2 24 20 - 29 mmol/L  Calcium 9.1 8.7 - 10.3 mg/dL   Total Protein 7.2 6.0 - 8.5 g/dL   Albumin 4.3 3.8 - 4.9 g/dL   Globulin, Total 2.9 1.5 - 4.5 g/dL   Albumin/Globulin Ratio 1.5 1.2 - 2.2   Bilirubin Total 0.4 0.0 - 1.2 mg/dL   Alkaline Phosphatase 47 (L) 48 - 121 IU/L   AST 21 0 - 40 IU/L   ALT 22 0 - 32 IU/L  Lipid Panel w/o Chol/HDL Ratio  Result Value Ref Range   Cholesterol, Total 194 100 - 199 mg/dL   Triglycerides 811 (H) 0 - 149 mg/dL   HDL 52 >91 mg/dL   VLDL Cholesterol Cal 27 5 - 40 mg/dL   LDL Chol Calc (NIH) 478 (H) 0 - 99 mg/dL  TSH  Result Value Ref Range   TSH 2.320 0.450 - 4.500 uIU/mL  VITAMIN D 25 Hydroxy (Vit-D Deficiency, Fractures)  Result Value Ref Range   Vit D, 25-Hydroxy 21.3 (L) 30.0 - 100.0 ng/mL  Magnesium  Result Value Ref Range   Magnesium 2.2 1.6 - 2.3 mg/dL  Cytology - PAP  Result Value Ref Range   High risk HPV Negative    Adequacy      Satisfactory for evaluation; transformation zone component PRESENT.   Diagnosis      - Negative for intraepithelial lesion or malignancy (NILM)   Comment Normal Reference Range HPV - Negative       Assessment & Plan:   Problem List Items Addressed This Visit      Respiratory   Asthma - Primary    Chronic, ongoing.  FEV1 81% and FEV1/FVC 99% today.  Continue current medication regimen and adjust as needed based on symptoms.  Controlled at this time with no recent exacerbation.  Return in 6 months for annual physical.      Relevant Medications   montelukast (SINGULAIR) 10 MG tablet   budesonide-formoterol (SYMBICORT) 160-4.5 MCG/ACT inhaler   Other Relevant Orders   Spirometry with graph (Completed)     Endocrine   Hypothyroidism    Chronic, ongoing.  Continue current medication regimen and adjust as needed.  TSH stable on recent  labs, recheck next visit at physical.      Relevant Medications   levothyroxine (SYNTHROID) 50 MCG tablet     Other   Hyperlipidemia    Chronic, ongoing.  Continue statin and adjust dose as needed. Lipid panel next visit fasting.      Relevant Medications   simvastatin (ZOCOR) 20 MG tablet    Other Visit Diagnoses    Colon cancer screening       Cologuard ordered   Relevant Orders   Cologuard       Follow up plan: Return in about 6 months (around 07/04/2020) for Annual physical.

## 2020-01-04 NOTE — Assessment & Plan Note (Signed)
Chronic, ongoing.  Continue statin and adjust dose as needed. Lipid panel next visit fasting.

## 2020-01-04 NOTE — Assessment & Plan Note (Signed)
Chronic, ongoing.  FEV1 81% and FEV1/FVC 99% today.  Continue current medication regimen and adjust as needed based on symptoms.  Controlled at this time with no recent exacerbation.  Return in 6 months for annual physical.

## 2020-01-04 NOTE — Patient Instructions (Signed)
Hypothyroidism  Hypothyroidism is when the thyroid gland does not make enough of certain hormones (it is underactive). The thyroid gland is a small gland located in the lower front part of the neck, just in front of the windpipe (trachea). This gland makes hormones that help control how the body uses food for energy (metabolism) as well as how the heart and brain function. These hormones also play a role in keeping your bones strong. When the thyroid is underactive, it produces too little of the hormones thyroxine (T4) and triiodothyronine (T3). What are the causes? This condition may be caused by:  Hashimoto's disease. This is a disease in which the body's disease-fighting system (immune system) attacks the thyroid gland. This is the most common cause.  Viral infections.  Pregnancy.  Certain medicines.  Birth defects.  Past radiation treatments to the head or neck for cancer.  Past treatment with radioactive iodine.  Past exposure to radiation in the environment.  Past surgical removal of part or all of the thyroid.  Problems with a gland in the center of the brain (pituitary gland).  Lack of enough iodine in the diet. What increases the risk? You are more likely to develop this condition if:  You are female.  You have a family history of thyroid conditions.  You use a medicine called lithium.  You take medicines that affect the immune system (immunosuppressants). What are the signs or symptoms? Symptoms of this condition include:  Feeling as though you have no energy (lethargy).  Not being able to tolerate cold.  Weight gain that is not explained by a change in diet or exercise habits.  Lack of appetite.  Dry skin.  Coarse hair.  Menstrual irregularity.  Slowing of thought processes.  Constipation.  Sadness or depression. How is this diagnosed? This condition may be diagnosed based on:  Your symptoms, your medical history, and a physical exam.  Blood  tests. You may also have imaging tests, such as an ultrasound or MRI. How is this treated? This condition is treated with medicine that replaces the thyroid hormones that your body does not make. After you begin treatment, it may take several weeks for symptoms to go away. Follow these instructions at home:  Take over-the-counter and prescription medicines only as told by your health care provider.  If you start taking any new medicines, tell your health care provider.  Keep all follow-up visits as told by your health care provider. This is important. ? As your condition improves, your dosage of thyroid hormone medicine may change. ? You will need to have blood tests regularly so that your health care provider can monitor your condition. Contact a health care provider if:  Your symptoms do not get better with treatment.  You are taking thyroid replacement medicine and you: ? Sweat a lot. ? Have tremors. ? Feel anxious. ? Lose weight rapidly. ? Cannot tolerate heat. ? Have emotional swings. ? Have diarrhea. ? Feel weak. Get help right away if you have:  Chest pain.  An irregular heartbeat.  A rapid heartbeat.  Difficulty breathing. Summary  Hypothyroidism is when the thyroid gland does not make enough of certain hormones (it is underactive).  When the thyroid is underactive, it produces too little of the hormones thyroxine (T4) and triiodothyronine (T3).  The most common cause is Hashimoto's disease, a disease in which the body's disease-fighting system (immune system) attacks the thyroid gland. The condition can also be caused by viral infections, medicine, pregnancy, or past   radiation treatment to the head or neck.  Symptoms may include weight gain, dry skin, constipation, feeling as though you do not have energy, and not being able to tolerate cold.  This condition is treated with medicine to replace the thyroid hormones that your body does not make. This information  is not intended to replace advice given to you by your health care provider. Make sure you discuss any questions you have with your health care provider. Document Revised: 01/02/2017 Document Reviewed: 12/31/2016 [employer] Patient Education  2020 [employer]  

## 2020-01-14 LAB — COLOGUARD: Cologuard: NEGATIVE

## 2020-01-23 LAB — COLOGUARD: COLOGUARD: NEGATIVE

## 2020-01-23 LAB — EXTERNAL GENERIC LAB PROCEDURE: COLOGUARD: NEGATIVE

## 2020-07-03 ENCOUNTER — Other Ambulatory Visit: Payer: Self-pay

## 2020-07-04 ENCOUNTER — Encounter: Payer: Self-pay | Admitting: Nurse Practitioner

## 2020-07-04 ENCOUNTER — Ambulatory Visit (INDEPENDENT_AMBULATORY_CARE_PROVIDER_SITE_OTHER): Payer: 59 | Admitting: Nurse Practitioner

## 2020-07-04 VITALS — BP 93/64 | HR 87 | Temp 98.9°F | Ht <= 58 in | Wt 118.0 lb

## 2020-07-04 DIAGNOSIS — J45909 Unspecified asthma, uncomplicated: Secondary | ICD-10-CM

## 2020-07-04 DIAGNOSIS — E782 Mixed hyperlipidemia: Secondary | ICD-10-CM | POA: Diagnosis not present

## 2020-07-04 DIAGNOSIS — K219 Gastro-esophageal reflux disease without esophagitis: Secondary | ICD-10-CM

## 2020-07-04 DIAGNOSIS — Z1231 Encounter for screening mammogram for malignant neoplasm of breast: Secondary | ICD-10-CM

## 2020-07-04 DIAGNOSIS — E559 Vitamin D deficiency, unspecified: Secondary | ICD-10-CM

## 2020-07-04 DIAGNOSIS — J3081 Allergic rhinitis due to animal (cat) (dog) hair and dander: Secondary | ICD-10-CM | POA: Diagnosis not present

## 2020-07-04 DIAGNOSIS — E039 Hypothyroidism, unspecified: Secondary | ICD-10-CM | POA: Diagnosis not present

## 2020-07-04 DIAGNOSIS — Z0000 Encounter for general adult medical examination without abnormal findings: Secondary | ICD-10-CM

## 2020-07-04 MED ORDER — BUDESONIDE-FORMOTEROL FUMARATE 160-4.5 MCG/ACT IN AERO
INHALATION_SPRAY | RESPIRATORY_TRACT | 4 refills | Status: DC
Start: 2020-07-04 — End: 2021-08-12

## 2020-07-04 MED ORDER — MONTELUKAST SODIUM 10 MG PO TABS
10.0000 mg | ORAL_TABLET | Freq: Every day | ORAL | 4 refills | Status: DC
Start: 2020-07-04 — End: 2021-04-08

## 2020-07-04 MED ORDER — LEVOTHYROXINE SODIUM 50 MCG PO TABS: ORAL_TABLET | ORAL | 4 refills | Status: DC

## 2020-07-04 MED ORDER — OMEPRAZOLE 20 MG PO CPDR: 1.0000 | DELAYED_RELEASE_CAPSULE | Freq: Every day | ORAL | 4 refills | Status: DC

## 2020-07-04 MED ORDER — SHINGRIX 50 MCG/0.5ML IM SUSR: 0.5000 mL | Freq: Once | INTRAMUSCULAR | 0 refills | Status: AC

## 2020-07-04 MED ORDER — SIMVASTATIN 20 MG PO TABS: ORAL_TABLET | ORAL | 4 refills | Status: DC

## 2020-07-04 NOTE — Assessment & Plan Note (Signed)
Chronic, stable.  Recommend she start daily supplement and adjust as needed.  Vit D level today. 

## 2020-07-04 NOTE — Progress Notes (Signed)
BP 93/64   Pulse 87   Temp 98.9 F (37.2 C) (Oral)   Ht 4' 8.7" (1.44 m)   Wt 118 lb (53.5 kg)   LMP 01/18/2015 (Approximate)   SpO2 98%   BMI 25.81 kg/m    Subjective:    Patient ID: Denise Proctor, female    DOB: [birth year], 60 y.o.   MRN: [id number]  HPI: Denise Proctor is a 60 y.o. female presenting on 07/04/2020 for comprehensive medical examination. Current medical complaints include:none  She currently lives with: son Menopausal Symptoms: no   HYPOTHYROIDISM Continues on Levothyroxine 50 MCG daily. Last May TSH 2.320. Vitamin D level 21.3 -- no current supplement. Thyroid control status:stable Satisfied with current treatment? yes Medication side effects: no Medication compliance: good compliance Etiology of hypothyroidism:  Recent dose adjustment:no Fatigue: yes Cold intolerance: no Heat intolerance: no Weight gain: no Weight loss: no Constipation: no Diarrhea/loose stools: no Palpitations: no Lower extremity edema: no Anxiety/depressed mood: yes   ASTHMA Takes Symbicort daily and uses Proair as needed + Singulair daily. Asthma status: stable Satisfied with current treatment?: yes Albuterol/rescue inhaler frequency: once every couple days -- exacerbated with pollen Dyspnea frequency: rarely Wheezing frequency: none Cough frequency: 1-2 times a week Nocturnal symptom frequency: none Limitation of activity: no Current upper respiratory symptoms: no Triggers: pollen Failed/intolerant to following asthma meds: none Asthma meds in past: Advair Aerochamber/spacer use: no Visits to ER or Urgent Care in past year: no Pneumovax: Up to Date Influenza: Not up to Date   GERD Last saw GI in July 2020 -- [facility] providers wanted follow-up in 5 months -- but did not attend.  She does continue to have issues with dysphagia -- she reports this has improved with eating smaller bites.  Is not taking Prilosec daily.  Denies reflux.  She reports being told she had a little  out pouch in esophagus. GERD control status: stable Satisfied with current treatment? yes Heartburn frequency: none Medication side effects: no  Medication compliance: stable Previous GERD medications: none Dysphagia: occasional Odynophagia:  no Hematemesis: no Blood in stool: no EGD: yes  HYPERLIPIDEMIA Continues on Simvastatin daily. Hyperlipidemia status: good compliance Satisfied with current treatment?  yes Side effects:  no Medication compliance: good compliance Past cholesterol meds: none Supplements: none Aspirin:  no The 10-year ASCVD risk score Denman George DC Jr., et al., 2013) is: 2%   Values used to calculate the score:     Age: 50 years     Sex: Female     Is Non-Hispanic African American: No     Diabetic: No     Tobacco smoker: No     Systolic Blood Pressure: 93 mmHg     Is BP treated: No     HDL Cholesterol: 52 mg/dL     Total Cholesterol: 194 mg/dL Chest pain:  no Coronary artery disease:  no Family history CAD:  yes Family history early CAD:  no  Depression Screen done today and results listed below:  Depression screen [facility] 2/9 07/04/2020 06/27/2019 08/11/2017 06/12/2017 07/02/2016  Decreased Interest 0 0 0 0 0  Down, Depressed, Hopeless 0 1 0 0 0  PHQ - 2 Score 0 1 0 0 0  Altered sleeping - - 0 - 1  Tired, decreased energy - - 0 - 1  Change in appetite - - 0 - 0  Feeling bad or failure about yourself  - - 0 - 0  Trouble concentrating - - 0 - 0  Moving  slowly or fidgety/restless - - 0 - 0  Suicidal thoughts - - 0 - 0  PHQ-9 Score - - 0 - 2    The patient does not have a history of falls. I did not complete a risk assessment for falls. A plan of care for falls was not documented.   Past Medical History:  Past Medical History:  Diagnosis Date  . Anxiety   . Asthma   . Depression   . GERD (gastroesophageal reflux disease)   . Hyperlipidemia   . Osteopenia   . Thyroid disease     Surgical History:  Past Surgical History:  Procedure Laterality Date   . COLONOSCOPY WITH PROPOFOL N/A 10/15/2017   Procedure: COLONOSCOPY WITH PROPOFOL;  Surgeon: Pasty Spillers, MD;  Location: [facility] ENDOSCOPY;  Service: Endoscopy;  Laterality: N/A;  . ESOPHAGOGASTRODUODENOSCOPY (EGD) WITH PROPOFOL N/A 10/15/2017   Procedure: ESOPHAGOGASTRODUODENOSCOPY (EGD) WITH PROPOFOL;  Surgeon: Pasty Spillers, MD;  Location: [facility] ENDOSCOPY;  Service: Endoscopy;  Laterality: N/A;  . TUBAL LIGATION      Medications:  Current Outpatient Medications on File Prior to Visit  Medication Sig  . albuterol (VENTOLIN HFA) 108 (90 Base) MCG/ACT inhaler INHALE 2 PUFFS BY MOUTH EVERY 6 HOURS AS NEEDED FOR WHEEZING OR SHORTNESS OF BREATH   No current facility-administered medications on file prior to visit.    Allergies:  No Known Allergies  Social History:  Social History   Socioeconomic History  . Marital status: Married    Spouse name: Not on file  . Number of children: Not on file  . Years of education: Not on file  . Highest education level: Not on file  Occupational History  . Not on file  Tobacco Use  . Smoking status: Never Smoker  . Smokeless tobacco: Never Used  Vaping Use  . Vaping Use: Never used  Substance and Sexual Activity  . Alcohol use: No    Alcohol/week: 0.0 standard drinks  . Drug use: No  . Sexual activity: Yes  Other Topics Concern  . Not on file  Social History Narrative  . Not on file   Social Determinants of Health   Financial Resource Strain: Low Risk   . Difficulty of Paying Living Expenses: Not hard at all  Food Insecurity: No Food Insecurity  . Worried About Programme researcher, broadcasting/film/video in the Last Year: Never true  . Ran Out of Food in the Last Year: Never true  Transportation Needs: No Transportation Needs  . Lack of Transportation (Medical): No  . Lack of Transportation (Non-Medical): No  Physical Activity: Inactive  . Days of Exercise per Week: 0 days  . Minutes of Exercise per Session: 0 min  Stress: No Stress Concern  Present  . Feeling of Stress : Only a little  Social Connections: Moderately Integrated  . Frequency of Communication with Friends and Family: More than three times a week  . Frequency of Social Gatherings with Friends and Family: More than three times a week  . Attends Religious Services: More than 4 times per year  . Active Member of Clubs or Organizations: Yes  . Attends Banker Meetings: Never  . Marital Status: Divorced  Catering manager Violence: Not At Risk  . Fear of Current or Ex-Partner: No  . Emotionally Abused: No  . Physically Abused: No  . Sexually Abused: No   Social History   Tobacco Use  Smoking Status Never Smoker  Smokeless Tobacco Never Used   Social History  Substance and Sexual Activity  Alcohol Use No  . Alcohol/week: 0.0 standard drinks    Family History:  Family History  Problem Relation Age of Onset  . Hypertension Mother   . Hyperlipidemia Mother   . COPD Mother   . Heart disease Father   . Diabetes Maternal Grandmother   . Cancer Paternal Grandmother        stomach  . Stroke Paternal Grandfather   . Heart disease Paternal Grandfather   . Hyperlipidemia Brother   . Diverticulitis Brother   . Heart disease Maternal Grandfather   . Diverticulitis Brother     Past medical history, surgical history, medications, allergies, family history and social history reviewed with patient today and changes made to appropriate areas of the chart.   Review of Systems - negative All other ROS negative except what is listed above and in the HPI.      Objective:    BP 93/64   Pulse 87   Temp 98.9 F (37.2 C) (Oral)   Ht 4' 8.7" (1.44 m)   Wt 118 lb (53.5 kg)   LMP 01/18/2015 (Approximate)   SpO2 98%   BMI 25.81 kg/m   Wt Readings from Last 3 Encounters:  07/04/20 118 lb (53.5 kg)  01/04/20 117 lb 3.2 oz (53.2 kg)  06/27/19 115 lb 6.4 oz (52.3 kg)    Physical Exam Constitutional:      General: She is awake. She is not in acute  distress.    Appearance: She is well-developed. She is not ill-appearing.  HENT:     Head: Normocephalic and atraumatic.     Right Ear: Hearing, tympanic membrane, ear canal and external ear normal. No drainage.     Left Ear: Hearing, tympanic membrane, ear canal and external ear normal. No drainage.     Nose: Nose normal.     Right Sinus: No maxillary sinus tenderness or frontal sinus tenderness.     Left Sinus: No maxillary sinus tenderness or frontal sinus tenderness.     Mouth/Throat:     Mouth: Mucous membranes are moist.     Pharynx: Oropharynx is clear. Uvula midline. No pharyngeal swelling, oropharyngeal exudate or posterior oropharyngeal erythema.  Eyes:     General: Lids are normal.        Right eye: No discharge.        Left eye: No discharge.     Extraocular Movements: Extraocular movements intact.     Conjunctiva/sclera: Conjunctivae normal.     Pupils: Pupils are equal, round, and reactive to light.     Visual Fields: Right eye visual fields normal and left eye visual fields normal.  Neck:     Thyroid: No thyromegaly.     Vascular: No carotid bruit.     Trachea: Trachea normal.  Cardiovascular:     Rate and Rhythm: Normal rate and regular rhythm.     Heart sounds: Normal heart sounds. No murmur heard. No gallop.   Pulmonary:     Effort: Pulmonary effort is normal. No accessory muscle usage or respiratory distress.     Breath sounds: Normal breath sounds.  Chest:  Breasts:     Right: Normal. No axillary adenopathy or supraclavicular adenopathy.     Left: Normal. No axillary adenopathy or supraclavicular adenopathy.    Abdominal:     General: Bowel sounds are normal.     Palpations: Abdomen is soft. There is no hepatomegaly or splenomegaly.     Tenderness: There is no abdominal tenderness.  Musculoskeletal:        General: Normal range of motion.     Cervical back: Normal range of motion and neck supple.     Right lower leg: No edema.     Left lower leg: No  edema.  Lymphadenopathy:     Head:     Right side of head: No submental, submandibular, tonsillar, preauricular or posterior auricular adenopathy.     Left side of head: No submental, submandibular, tonsillar, preauricular or posterior auricular adenopathy.     Cervical: No cervical adenopathy.     Upper Body:     Right upper body: No supraclavicular, axillary or pectoral adenopathy.     Left upper body: No supraclavicular, axillary or pectoral adenopathy.  Skin:    General: Skin is warm and dry.     Capillary Refill: Capillary refill takes less than 2 seconds.     Findings: No rash.  Neurological:     Mental Status: She is alert and oriented to person, place, and time.     Cranial Nerves: Cranial nerves are intact.     Gait: Gait is intact.     Deep Tendon Reflexes: Reflexes are normal and symmetric.     Reflex Scores:      Brachioradialis reflexes are 2+ on the right side and 2+ on the left side.      Patellar reflexes are 2+ on the right side and 2+ on the left side. Psychiatric:        Attention and Perception: Attention normal.        Mood and Affect: Mood normal.        Speech: Speech normal.        Behavior: Behavior normal. Behavior is cooperative.        Thought Content: Thought content normal.        Judgment: Judgment normal.    Results for orders placed or performed in visit on 01/24/20  Cologuard  Result Value Ref Range   Cologuard Negative Negative      Assessment & Plan:   Problem List Items Addressed This Visit      Respiratory   Asthma    Chronic, ongoing.  FEV1 81% and FEV1/FVC 99% on 01/04/2020.  Continue current medication regimen and adjust as needed based on symptoms.  Controlled at this time with no recent exacerbation.  Return in 6 months for follow-up and repeat spirometry.      Relevant Medications   budesonide-formoterol (SYMBICORT) 160-4.5 MCG/ACT inhaler   montelukast (SINGULAIR) 10 MG tablet   Other Relevant Orders   CBC with  Differential/Platelet   Allergic rhinitis    Chronic, ongoing in presence of asthma.  Continue daily Singulair and adjust regimen as needed.        Digestive   GERD (gastroesophageal reflux disease)    Chronic, ongoing. Continue Prilosec and recommend she take this daily as ordered.  Mag level today.  Recommend she follow-up with GI as was recommended.      Relevant Medications   omeprazole (PRILOSEC) 20 MG capsule   Other Relevant Orders   Magnesium     Endocrine   Hypothyroidism - Primary    Chronic, ongoing.  Continue current medication regimen and adjust as needed.  TSH stable on recent labs, check today TSH, Free T4, and thyroid antibody.      Relevant Medications   levothyroxine (SYNTHROID) 50 MCG tablet   Other Relevant Orders   TSH   T4, free   Thyroid peroxidase antibody  Other   Hyperlipidemia    Chronic, ongoing.  Continue statin and adjust dose as needed. Lipid panel today.      Relevant Medications   simvastatin (ZOCOR) 20 MG tablet   Other Relevant Orders   Comprehensive metabolic panel   Lipid Panel w/o Chol/HDL Ratio   Vitamin D deficiency    Chronic, stable.  Recommend she start daily supplement and adjust as needed.  Vit D level today.      Relevant Orders   VITAMIN D 25 Hydroxy (Vit-D Deficiency, Fractures)    Other Visit Diagnoses    Encounter for screening mammogram for malignant neoplasm of breast       Mammogram ordered   Relevant Orders   MM 3D SCREEN BREAST BILATERAL   Annual physical exam       Annual labs today and health maintenance reviewed.  Shingrix to pharmacy.       Follow up plan: Return in about 6 months (around 01/03/2021) for Asthma, HLD, and Thyroid + Vit D -- spirometry needed.   LABORATORY TESTING:  - Pap smear: Up To Date  IMMUNIZATIONS:   - Tdap: Tetanus vaccination status reviewed: last tetanus booster within 10 years. - Influenza: Up to date - Pneumovax: Not applicable - Prevnar: Not applicable - HPV: Not  applicable - Zostavax vaccine: Ordered  SCREENING: -Mammogram: Up to date  - Colonoscopy: Up To Date - Bone Density: Not applicable  -Hearing Test: Not applicable  -Spirometry: Not applicable   PATIENT COUNSELING:   Advised to take 1 mg of folate supplement per day if capable of pregnancy.   Sexuality: Discussed sexually transmitted diseases, partner selection, use of condoms, avoidance of unintended pregnancy  and contraceptive alternatives.   Advised to avoid cigarette smoking.  I discussed with the patient that most people either abstain from alcohol or drink within safe limits (<=14/week and <=4 drinks/occasion for males, <=7/weeks and <= 3 drinks/occasion for females) and that the risk for alcohol disorders and other health effects rises proportionally with the number of drinks per week and how often a drinker exceeds daily limits.  Discussed cessation/primary prevention of drug use and availability of treatment for abuse.   Diet: Encouraged to adjust caloric intake to maintain  or achieve ideal body weight, to reduce intake of dietary saturated fat and total fat, to limit sodium intake by avoiding high sodium foods and not adding table salt, and to maintain adequate dietary potassium and calcium preferably from fresh fruits, vegetables, and low-fat dairy products.    Stressed the importance of regular exercise  Injury prevention: Discussed safety belts, safety helmets, smoke detector, smoking near bedding or upholstery.   Dental health: Discussed importance of regular tooth brushing, flossing, and dental visits.    NEXT PREVENTATIVE PHYSICAL DUE IN 1 YEAR. Return in about 6 months (around 01/03/2021) for Asthma, HLD, and Thyroid + Vit D -- spirometry needed.

## 2020-07-04 NOTE — Assessment & Plan Note (Signed)
Chronic, ongoing in presence of asthma.  Continue daily Singulair and adjust regimen as needed. 

## 2020-07-04 NOTE — Assessment & Plan Note (Signed)
Chronic, ongoing.  Continue statin and adjust dose as needed. Lipid panel today. 

## 2020-07-04 NOTE — Patient Instructions (Addendum)
Add on Vitamin D 2000 units daily.  [facility]  Address: [address] [place], [place], [place] [address]  Phone: [phone]   Vitamin D Deficiency Vitamin D deficiency is when your body does not have enough vitamin D. Vitamin D is important to your body because:  It helps your body use other minerals.  It helps to keep your bones strong and healthy.  It may help to prevent some diseases.  It helps your heart and other muscles work well. Not getting enough vitamin D can make your bones soft. It can also cause other health problems. What are the causes? This condition may be caused by:  Not eating enough foods that contain vitamin D.  Not getting enough sun.  Having diseases that make it hard for your body to absorb vitamin D.  Having a surgery in which a part of the stomach or a part of the small intestine is removed.  Having kidney disease or liver disease. What increases the risk? You are more likely to get this condition if:  You are older.  You do not spend much time outdoors.  You live in a nursing home.  You have had broken bones.  You have weak or thin bones (osteoporosis).  You have a disease or condition that changes how your body absorbs vitamin D.  You have dark skin.  You take certain medicines.  You are overweight or obese. What are the signs or symptoms?  In mild cases, there may not be any symptoms. If the condition is very bad, symptoms may include: ? Bone pain. ? Muscle pain. ? Falling often. ? Broken bones caused by a minor injury. How is this treated? Treatment may include taking supplements as told by your doctor. Your doctor will tell you what dose is best for you. Supplements may include:  Vitamin D.  Calcium. Follow these instructions at home: Eating and drinking  Eat foods that contain vitamin D, such as: ? Dairy products, cereals, or juices with added vitamin D. Check the label. ? Fish,  such as salmon or trout. ? Eggs. ? Oysters. ? Mushrooms. The items listed above may not be a complete list of what you can eat and drink. Contact a dietitian for more options.   General instructions  Take medicines and supplements only as told by your doctor.  Get regular, safe exposure to natural sunlight.  Do not use a tanning bed.  Maintain a healthy weight. Lose weight if needed.  Keep all follow-up visits as told by your doctor. This is important. How is this prevented?  You can get vitamin D by: ? Eating foods that naturally contain vitamin D. ? Eating or drinking products that have vitamin D added to them, such as cereals, juices, and milk. ? Taking vitamin D or a multivitamin that contains vitamin D. ? Being in the sun. Your body makes vitamin D when your skin is exposed to sunlight. Your body changes the sunlight into a form of the vitamin that it can use. Contact a doctor if:  Your symptoms do not go away.  You feel sick to your stomach (nauseous).  You throw up (vomit).  You poop less often than normal, or you have trouble pooping (constipation). Summary  Vitamin D deficiency is when your body does not have enough vitamin D.  Vitamin D helps to keep your bones strong and healthy.  This condition is often treated by taking a supplement.  Your doctor will  tell you what dose is best for you. This information is not intended to replace advice given to you by your health care provider. Make sure you discuss any questions you have with your health care provider. Document Revised: 09/28/2017 Document Reviewed: 09/28/2017 Elsevier Patient Education  2021 [employer].

## 2020-07-04 NOTE — Assessment & Plan Note (Signed)
Chronic, ongoing.  Continue current medication regimen and adjust as needed.  TSH stable on recent labs, check today TSH, Free T4, and thyroid antibody.

## 2020-07-04 NOTE — Assessment & Plan Note (Signed)
Chronic, ongoing. Continue Prilosec and recommend she take this daily as ordered.  Mag level today.  Recommend she follow-up with GI as was recommended.

## 2020-07-04 NOTE — Assessment & Plan Note (Signed)
Chronic, ongoing.  FEV1 81% and FEV1/FVC 99% on 01/04/2020.  Continue current medication regimen and adjust as needed based on symptoms.  Controlled at this time with no recent exacerbation.  Return in 6 months for follow-up and repeat spirometry.

## 2020-07-05 LAB — CBC WITH DIFFERENTIAL/PLATELET
Basophils Absolute: 0 10*3/uL (ref 0.0–0.2)
Basos: 0 %
EOS (ABSOLUTE): 0 10*3/uL (ref 0.0–0.4)
Eos: 1 %
Hematocrit: 40.3 % (ref 34.0–46.6)
Hemoglobin: 13.6 g/dL (ref 11.1–15.9)
Immature Grans (Abs): 0 10*3/uL (ref 0.0–0.1)
Immature Granulocytes: 0 %
Lymphocytes Absolute: 2.6 10*3/uL (ref 0.7–3.1)
Lymphs: 36 %
MCH: 30.2 pg (ref 26.6–33.0)
MCHC: 33.7 g/dL (ref 31.5–35.7)
MCV: 90 fL (ref 79–97)
Monocytes Absolute: 0.4 10*3/uL (ref 0.1–0.9)
Monocytes: 6 %
Neutrophils Absolute: 4.1 10*3/uL (ref 1.4–7.0)
Neutrophils: 57 %
Platelets: 365 10*3/uL (ref 150–450)
RBC: 4.5 x10E6/uL (ref 3.77–5.28)
RDW: 12.8 % (ref 11.7–15.4)
WBC: 7.3 10*3/uL (ref 3.4–10.8)

## 2020-07-05 LAB — LIPID PANEL W/O CHOL/HDL RATIO
Cholesterol, Total: 197 mg/dL (ref 100–199)
HDL: 61 mg/dL (ref >39)
LDL Chol Calc (NIH): 121 mg/dL — ABNORMAL HIGH (ref 0–99)
Triglycerides: 81 mg/dL (ref 0–149)
VLDL Cholesterol Cal: 15 mg/dL (ref 5–40)

## 2020-07-05 LAB — COMPREHENSIVE METABOLIC PANEL
ALT: 16 IU/L (ref 0–32)
AST: 20 IU/L (ref 0–40)
Albumin/Globulin Ratio: 1.7 (ref 1.2–2.2)
Albumin: 4.5 g/dL (ref 3.8–4.8)
Alkaline Phosphatase: 42 IU/L — ABNORMAL LOW (ref 44–121)
BUN/Creatinine Ratio: 14 (ref 12–28)
BUN: 8 mg/dL (ref 8–27)
Bilirubin Total: 0.8 mg/dL (ref 0.0–1.2)
CO2: 22 mmol/L (ref 20–29)
Calcium: 9.5 mg/dL (ref 8.7–10.3)
Chloride: 102 mmol/L (ref 96–106)
Creatinine, Ser: 0.57 mg/dL (ref 0.57–1.00)
Globulin, Total: 2.6 g/dL (ref 1.5–4.5)
Glucose: 101 mg/dL — ABNORMAL HIGH (ref 65–99)
Potassium: 4.2 mmol/L (ref 3.5–5.2)
Sodium: 139 mmol/L (ref 134–144)
Total Protein: 7.1 g/dL (ref 6.0–8.5)
eGFR: 103 mL/min/{1.73_m2} (ref >59)

## 2020-07-05 LAB — MAGNESIUM: Magnesium: 2.2 mg/dL (ref 1.6–2.3)

## 2020-07-05 LAB — T4, FREE: Free T4: 1.55 ng/dL (ref 0.82–1.77)

## 2020-07-05 LAB — TSH: TSH: 3.26 u[IU]/mL (ref 0.450–4.500)

## 2020-07-05 LAB — THYROID PEROXIDASE ANTIBODY: Thyroperoxidase Ab SerPl-aCnc: 10 IU/mL (ref 0–34)

## 2020-07-05 LAB — VITAMIN D 25 HYDROXY (VIT D DEFICIENCY, FRACTURES): Vit D, 25-Hydroxy: 22 ng/mL — ABNORMAL LOW (ref 30.0–100.0)

## 2020-07-05 NOTE — Progress Notes (Signed)
Contacted via MyChart Good evening Denise Proctor, your labs have returned: - Kidney function, creatinine and eGFR, is normal and liver function, AST and ALT, is normal. - Glucose, sugar, is mildly elevated, we will continue to monitor this. - CBC shows no anemia - Thyroid labs are normal, continue current Levothyroxine dosing. - Vitamin D level remains on lower side, I would recommend you take Vitamin D3 2000 units daily to help bone health. - Magnesium is normal - Your LDL is above normal. The LDL is the bad cholesterol. I would recommend we increase your Simvastatin to 40 MG daily.  Would you be okay with this change?  If so message me and let me know, I will send in increase and then would want you to schedule a lab only visit for 8 weeks to check lipid panel.  Any questions? Keep being awesome!!  Thank you for allowing me to participate in your care.  I appreciate you. Kindest regards, Jackson Fetters

## 2020-08-24 ENCOUNTER — Other Ambulatory Visit: Payer: Self-pay

## 2020-08-24 ENCOUNTER — Ambulatory Visit
Admission: RE | Admit: 2020-08-24 | Discharge: 2020-08-24 | Disposition: A | Discharge status: Home / Self Care | Payer: 59 | Source: Ambulatory Visit | Attending: Nurse Practitioner | Admitting: Nurse Practitioner

## 2020-08-24 DIAGNOSIS — Z1231 Encounter for screening mammogram for malignant neoplasm of breast: Secondary | ICD-10-CM | POA: Diagnosis present

## 2020-08-27 ENCOUNTER — Other Ambulatory Visit: Payer: Self-pay | Admitting: Nurse Practitioner

## 2020-08-27 DIAGNOSIS — R928 Other abnormal and inconclusive findings on diagnostic imaging of breast: Secondary | ICD-10-CM

## 2020-08-29 MED ORDER — SIMVASTATIN 40 MG PO TABS: 40.0000 mg | ORAL_TABLET | Freq: Every day | ORAL | 4 refills | Status: DC

## 2020-09-05 ENCOUNTER — Other Ambulatory Visit: Payer: Self-pay

## 2020-09-05 ENCOUNTER — Ambulatory Visit
Admission: RE | Admit: 2020-09-05 | Discharge: 2020-09-05 | Disposition: A | Discharge status: Home / Self Care | Payer: 59 | Source: Ambulatory Visit | Attending: Nurse Practitioner | Admitting: Nurse Practitioner

## 2020-09-05 DIAGNOSIS — R928 Other abnormal and inconclusive findings on diagnostic imaging of breast: Secondary | ICD-10-CM | POA: Diagnosis present

## 2020-11-13 IMAGING — MG DIGITAL SCREENING BILAT W/ TOMO W/ CAD
8 series · 8 of 24 positions shown · non-contrast
Comparison: Previous exam(s).

CLINICAL DATA: Screening.

EXAM:
DIGITAL SCREENING BILATERAL MAMMOGRAM WITH TOMO AND CAD

[L MLO synth-2D]
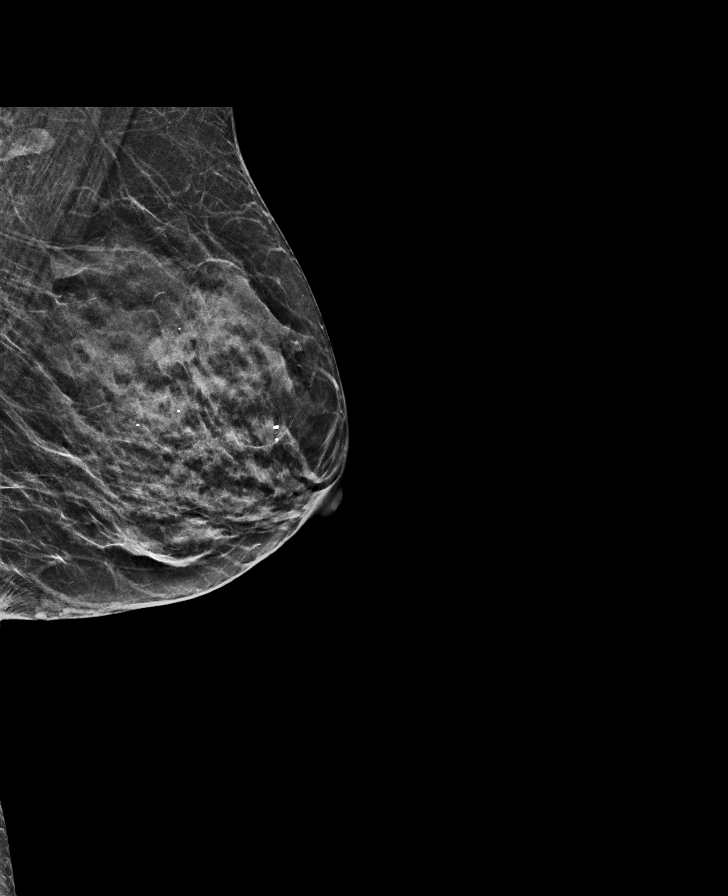

[R CC synth-2D]
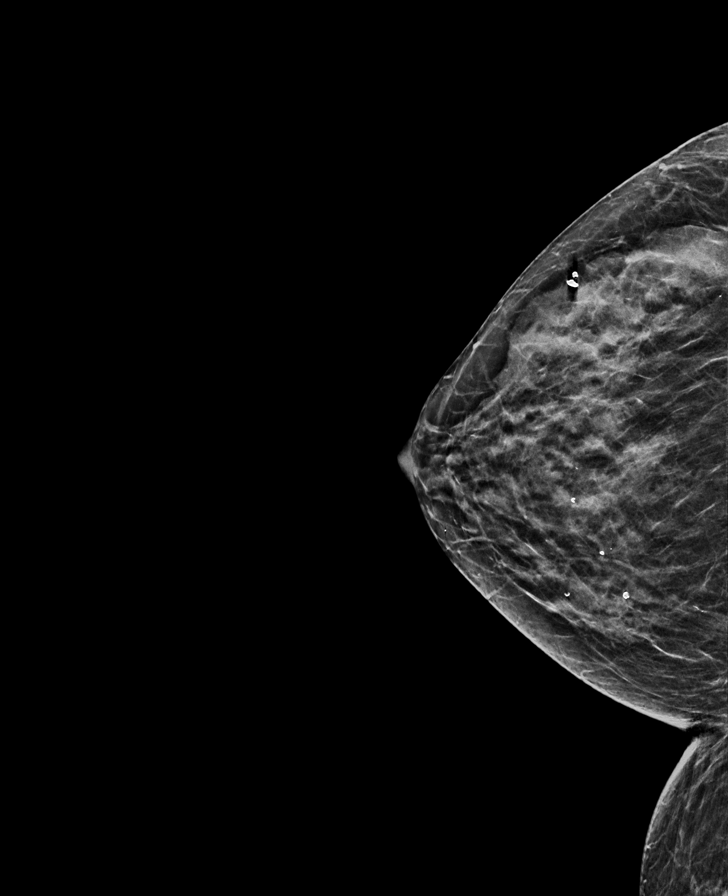

[L CC synth-2D]
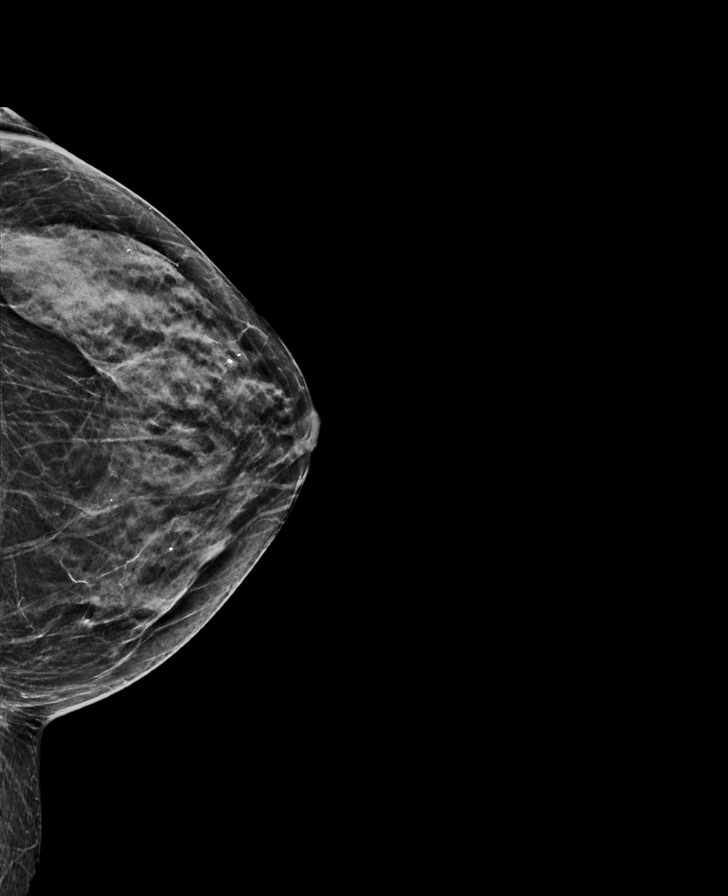

[R MLO synth-2D]
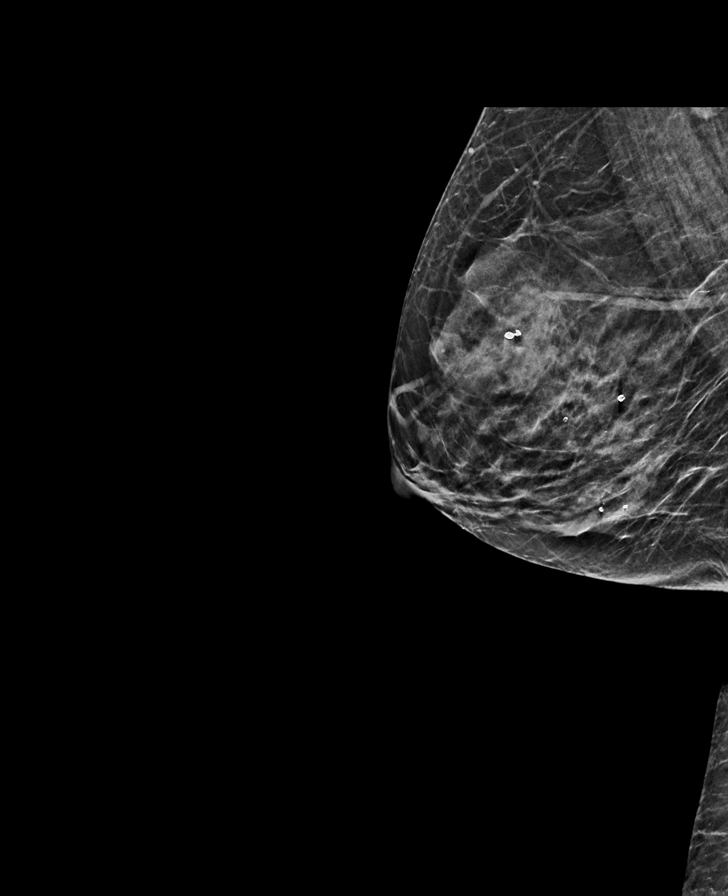

[R MLO tomo · tomo slice 25/48.0]
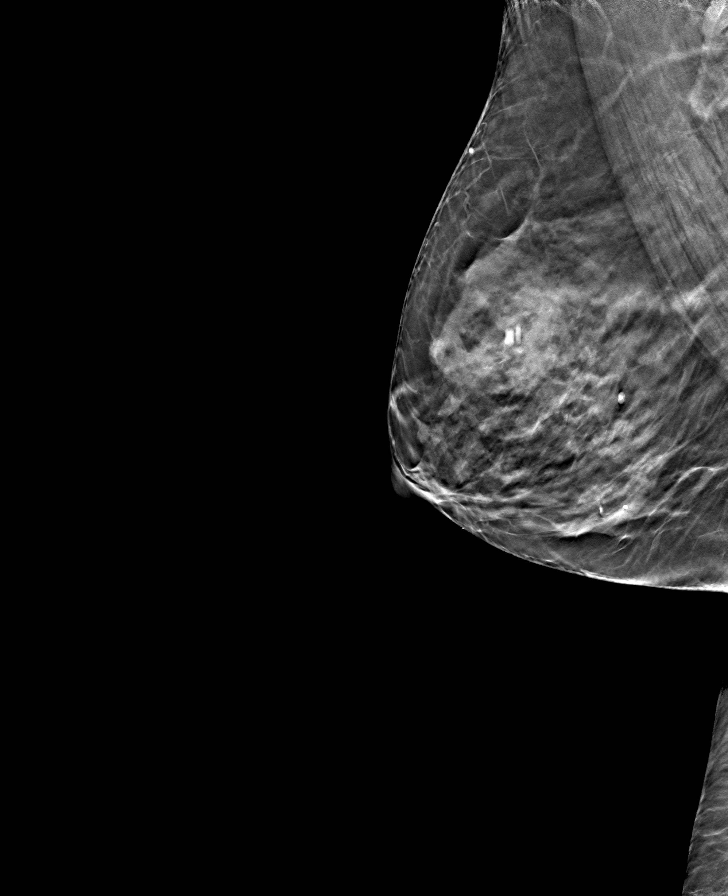

[L MLO tomo · tomo slice 25/49.0]
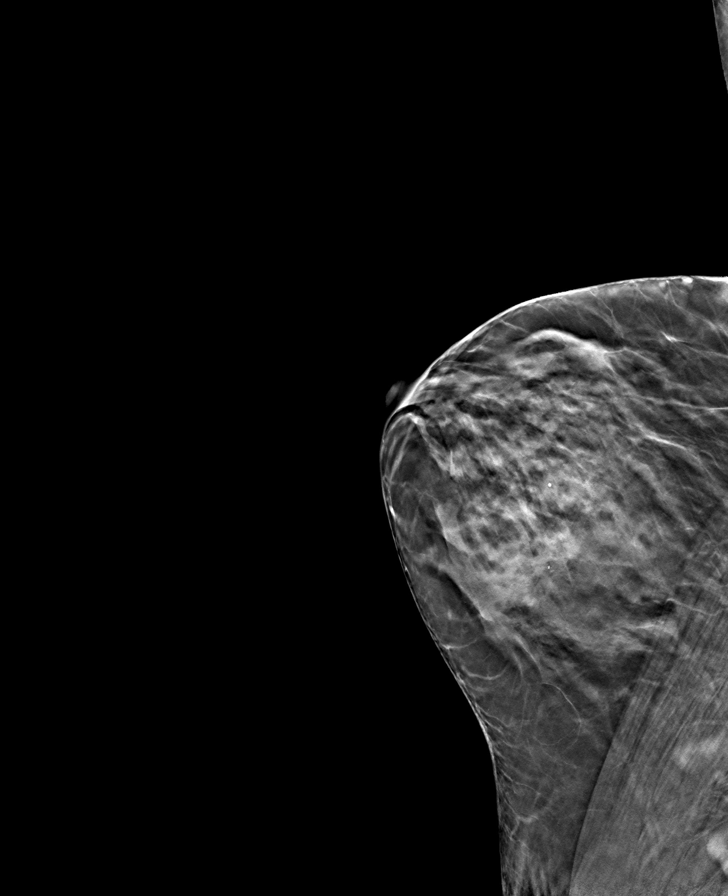

[L CC tomo · tomo slice 25/48.0]
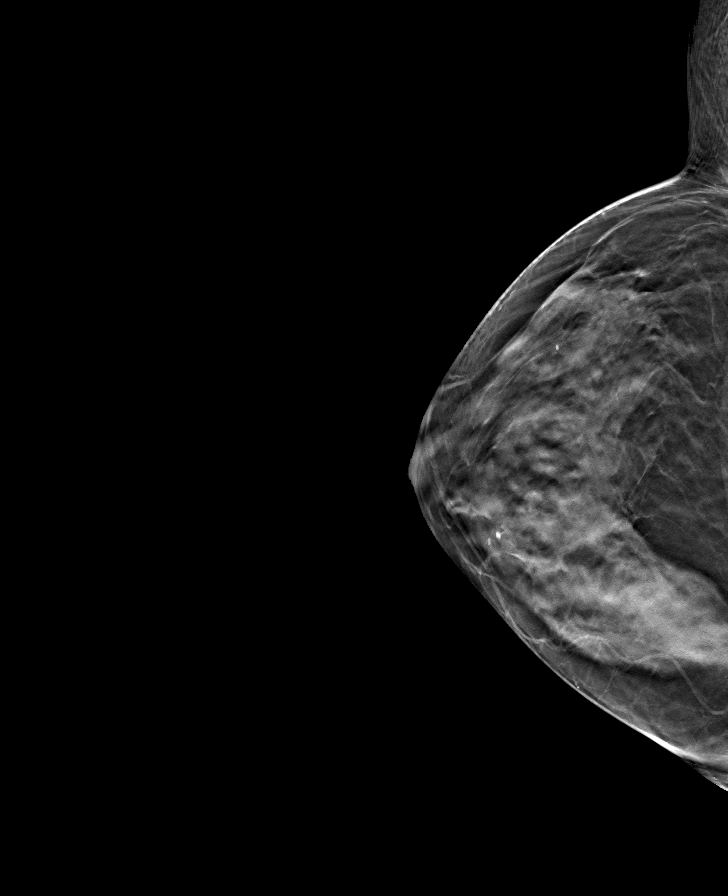

[R CC tomo · tomo slice 23/44.0]
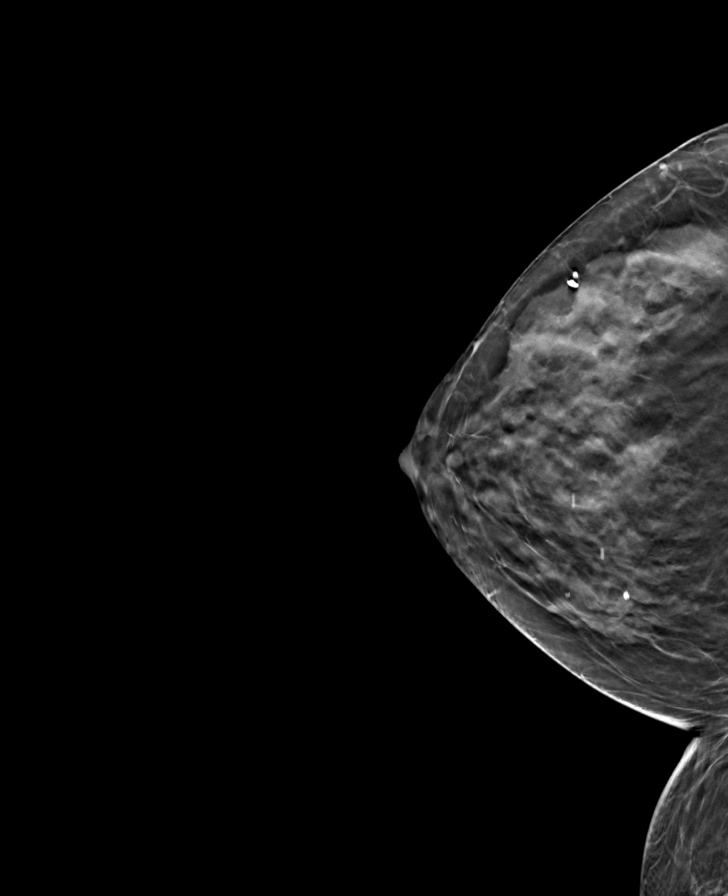

[8 of 24 positions shown; findings below may reference images not displayed]

ACR Breast Density Category c: The breast tissue is heterogeneously
dense, which may obscure small masses.
FINDINGS: There are no findings suspicious for malignancy. Images were
processed with CAD.
IMPRESSION: No mammographic evidence of malignancy. A result letter of this
screening mammogram will be mailed directly to the patient.

RECOMMENDATION:
Screening mammogram in one year. (Code:FT-U-LHB)

BI-RADS CATEGORY  1: Negative.

## 2020-12-28 ENCOUNTER — Encounter: Payer: Self-pay | Admitting: Nurse Practitioner

## 2020-12-28 DIAGNOSIS — R7301 Impaired fasting glucose: Secondary | ICD-10-CM | POA: Insufficient documentation

## 2021-01-04 ENCOUNTER — Encounter: Payer: Self-pay | Admitting: Nurse Practitioner

## 2021-01-04 ENCOUNTER — Other Ambulatory Visit: Payer: Self-pay

## 2021-01-04 ENCOUNTER — Ambulatory Visit: Payer: 59 | Admitting: Nurse Practitioner

## 2021-01-04 VITALS — BP 106/71 | HR 85 | Temp 98.5°F | Wt 123.4 lb

## 2021-01-04 DIAGNOSIS — K219 Gastro-esophageal reflux disease without esophagitis: Secondary | ICD-10-CM | POA: Diagnosis not present

## 2021-01-04 DIAGNOSIS — E039 Hypothyroidism, unspecified: Secondary | ICD-10-CM

## 2021-01-04 DIAGNOSIS — J45909 Unspecified asthma, uncomplicated: Secondary | ICD-10-CM

## 2021-01-04 DIAGNOSIS — E782 Mixed hyperlipidemia: Secondary | ICD-10-CM | POA: Diagnosis not present

## 2021-01-04 DIAGNOSIS — W19XXXA Unspecified fall, initial encounter: Secondary | ICD-10-CM

## 2021-01-04 DIAGNOSIS — R7301 Impaired fasting glucose: Secondary | ICD-10-CM

## 2021-01-04 LAB — MICROALBUMIN, URINE WAIVED
Creatinine, Urine Waived: 50 mg/dL (ref 10–300)
Microalb, Ur Waived: 10 mg/L (ref 0–19)
Microalb/Creat Ratio: <30 mg/g (ref <30)

## 2021-01-04 LAB — BAYER DCA HB A1C WAIVED: HB A1C (BAYER DCA - WAIVED): 6.5 % — ABNORMAL HIGH (ref 4.8–5.6)

## 2021-01-04 NOTE — Progress Notes (Signed)
BP 106/71   Pulse 85   Temp 98.5 F (36.9 C) (Oral)   Wt 123 lb 6.4 oz (56 kg)   LMP 01/18/2015 (Approximate)   SpO2 98%   BMI 26.99 kg/m    Subjective:    Patient ID: Denise Proctor, female    DOB: [birth year], 60 y.o.   MRN: [id number]  HPI: Denise Proctor is a 60 y.o. female  Chief Complaint  Patient presents with   Hyperlipidemia   IFG   Fall    Patient states she fell about a month ago. Patient states she walked into her home and when she didn't pet him and she walked into the kitchen and became dizzy and doesn't remember if her dog jumped on her or she hit her head. Patient states she would lay her head down on the corner and the dizzy spell went away. Patient states she called the EMS to be evaluated and was told she was fine. Patient states she has a history of passing out before. Patient states she doesn't know if she had a panic attack an   FALL Reports having a fall one month ago, was walking into her home and dog wanted her to pet them, so she walked into kitchen + felt dizzy -- she does not remember if dog jumped on her.  She does not recall hitting head.  When she woke-up was on the floor.  After waking up she felt unsteady, but put head down and dizziness went away.  EMS came to the house -- vitals signs 139/89, HR 96, and O2 98% -- she did not go to ER, as was better.  Reports this has happened before when dog agitates her.  She reports not having drank a lot of fluids that day, but did eat a meal.  ASTHMA Takes Symbicort daily and uses Proair as needed + Singulair daily. Asthma status: stable Satisfied with current treatment?: yes Albuterol/rescue inhaler frequency: once every couple days -- exacerbated with pollen Dyspnea frequency: rarely Wheezing frequency: none Cough frequency: 1-2 times a week Nocturnal symptom frequency: none Limitation of activity: no Current upper respiratory symptoms: no Triggers: pollen Failed/intolerant to following asthma meds:  none Asthma meds in past: Advair Aerochamber/spacer use: no Visits to ER or Urgent Care in past year: no Pneumovax: Up to Date Influenza: Not up to Date    GERD Last saw GI in July 2020 -- [facility] providers wanted follow-up in 5 months -- but did not attend.  She does continue to have occasional issues with dysphagia -- she reports this has improved with eating smaller bites.  Denies reflux.  Reports being told she had a little out pouch in esophagus.  Taking Prilosec every day. GERD control status: stable Satisfied with current treatment? yes Heartburn frequency: none Medication side effects: no  Medication compliance: stable Previous GERD medications: none Dysphagia: occasional Odynophagia:  no Hematemesis: no Blood in stool: no EGD: yes   HYPERLIPIDEMIA Continues on Simvastatin daily. Hyperlipidemia status: good compliance Satisfied with current treatment?  yes Side effects:  no Medication compliance: good compliance Past cholesterol meds: none Supplements: none Aspirin:  no The 10-year ASCVD risk score (Arnett DK, et al., 2019) is: 2.6%   Values used to calculate the score:     Age: 16 years     Sex: Female     Is Non-Hispanic African American: No     Diabetic: No     Tobacco smoker: No     Systolic Blood Pressure:  106 mmHg     Is BP treated: No     HDL Cholesterol: 61 mg/dL     Total Cholesterol: 197 mg/dL Chest pain:  no Coronary artery disease:  no Family history CAD:  yes Family history early CAD:  no  HYPOTHYROIDISM Continues on Levothyroxine 50 MCG daily.  Thyroid control status:stable Satisfied with current treatment? yes Medication side effects: no Medication compliance: good compliance Etiology of hypothyroidism:  Recent dose adjustment:no Fatigue: yes Cold intolerance: no Heat intolerance: no Weight gain: no Weight loss: no Constipation: no Diarrhea/loose stools: no Palpitations: no Lower extremity edema: no Anxiety/depressed mood: yes    Relevant past medical, surgical, family and social history reviewed and updated as indicated. Interim medical history since our last visit reviewed. Allergies and medications reviewed and updated.  Review of Systems  Constitutional:  Negative for activity change, appetite change, diaphoresis, fatigue and fever.  Respiratory:  Negative for cough, chest tightness and shortness of breath.   Cardiovascular:  Negative for chest pain, palpitations and leg swelling.  Gastrointestinal:  Negative for abdominal distention, abdominal pain, constipation, diarrhea, nausea and vomiting.  Endocrine: Negative for cold intolerance and heat intolerance.  Neurological: Negative.   Psychiatric/Behavioral:  The patient is not nervous/anxious.    Per HPI unless specifically indicated above     Objective:    BP 106/71   Pulse 85   Temp 98.5 F (36.9 C) (Oral)   Wt 123 lb 6.4 oz (56 kg)   LMP 01/18/2015 (Approximate)   SpO2 98%   BMI 26.99 kg/m   Wt Readings from Last 3 Encounters:  01/04/21 123 lb 6.4 oz (56 kg)  07/04/20 118 lb (53.5 kg)  01/04/20 117 lb 3.2 oz (53.2 kg)    Physical Exam Vitals and nursing note reviewed.  Constitutional:      General: She is awake. She is not in acute distress.    Appearance: She is well-developed. She is not ill-appearing.  HENT:     Head: Normocephalic.     Right Ear: Hearing normal.     Left Ear: Hearing normal.     Nose: Nose normal.     Mouth/Throat:     Mouth: Mucous membranes are moist.  Eyes:     General: Lids are normal.        Right eye: No discharge.        Left eye: No discharge.     Conjunctiva/sclera: Conjunctivae normal.     Pupils: Pupils are equal, round, and reactive to light.  Neck:     Thyroid: No thyromegaly.     Vascular: No carotid bruit.  Cardiovascular:     Rate and Rhythm: Normal rate and regular rhythm.     Heart sounds: Normal heart sounds. No murmur heard.   No gallop.  Pulmonary:     Effort: Pulmonary effort is  normal. No accessory muscle usage or respiratory distress.     Breath sounds: Normal breath sounds.  Abdominal:     General: Bowel sounds are normal.     Palpations: Abdomen is soft.  Musculoskeletal:     Cervical back: Normal range of motion and neck supple.     Right lower leg: No edema.     Left lower leg: No edema.  Skin:    General: Skin is warm and dry.  Neurological:     Mental Status: She is alert and oriented to person, place, and time.  Psychiatric:        Attention and Perception: Attention  normal.        Mood and Affect: Mood normal.        Behavior: Behavior normal. Behavior is cooperative.        Thought Content: Thought content normal.        Judgment: Judgment normal.    Results for orders placed or performed in visit on 07/04/20  Comprehensive metabolic panel  Result Value Ref Range   Glucose 101 (H) 65 - 99 mg/dL   BUN 8 8 - 27 mg/dL   Creatinine, Ser 0.57 0.57 - 1.00 mg/dL   eGFR 103 >59 mL/min/1.73   BUN/Creatinine Ratio 14 12 - 28   Sodium 139 134 - 144 mmol/L   Potassium 4.2 3.5 - 5.2 mmol/L   Chloride 102 96 - 106 mmol/L   CO2 22 20 - 29 mmol/L   Calcium 9.5 8.7 - 10.3 mg/dL   Total Protein 7.1 6.0 - 8.5 g/dL   Albumin 4.5 3.8 - 4.8 g/dL   Globulin, Total 2.6 1.5 - 4.5 g/dL   Albumin/Globulin Ratio 1.7 1.2 - 2.2   Bilirubin Total 0.8 0.0 - 1.2 mg/dL   Alkaline Phosphatase 42 (L) 44 - 121 IU/L   AST 20 0 - 40 IU/L   ALT 16 0 - 32 IU/L  CBC with Differential/Platelet  Result Value Ref Range   WBC 7.3 3.4 - 10.8 x10E3/uL   RBC 4.50 3.77 - 5.28 x10E6/uL   Hemoglobin 13.6 11.1 - 15.9 g/dL   Hematocrit 40.3 34.0 - 46.6 %   MCV 90 79 - 97 fL   MCH 30.2 26.6 - 33.0 pg   MCHC 33.7 31.5 - 35.7 g/dL   RDW 12.8 11.7 - 15.4 %   Platelets 365 150 - 450 x10E3/uL   Neutrophils 57 Not Estab. %   Lymphs 36 Not Estab. %   Monocytes 6 Not Estab. %   Eos 1 Not Estab. %   Basos 0 Not Estab. %   Neutrophils Absolute 4.1 1.4 - 7.0 x10E3/uL   Lymphocytes  Absolute 2.6 0.7 - 3.1 x10E3/uL   Monocytes Absolute 0.4 0.1 - 0.9 x10E3/uL   EOS (ABSOLUTE) 0.0 0.0 - 0.4 x10E3/uL   Basophils Absolute 0.0 0.0 - 0.2 x10E3/uL   Immature Granulocytes 0 Not Estab. %   Immature Grans (Abs) 0.0 0.0 - 0.1 x10E3/uL  Lipid Panel w/o Chol/HDL Ratio  Result Value Ref Range   Cholesterol, Total 197 100 - 199 mg/dL   Triglycerides 81 0 - 149 mg/dL   HDL 61 >39 mg/dL   VLDL Cholesterol Cal 15 5 - 40 mg/dL   LDL Chol Calc (NIH) 121 (H) 0 - 99 mg/dL  TSH  Result Value Ref Range   TSH 3.260 0.450 - 4.500 uIU/mL  T4, free  Result Value Ref Range   Free T4 1.55 0.82 - 1.77 ng/dL  Thyroid peroxidase antibody  Result Value Ref Range   Thyroperoxidase Ab SerPl-aCnc 10 0 - 34 IU/mL  VITAMIN D 25 Hydroxy (Vit-D Deficiency, Fractures)  Result Value Ref Range   Vit D, 25-Hydroxy 22.0 (L) 30.0 - 100.0 ng/mL  Magnesium  Result Value Ref Range   Magnesium 2.2 1.6 - 2.3 mg/dL      Assessment & Plan:   Problem List Items Addressed This Visit       Respiratory   Asthma - Primary    Chronic, ongoing.  FEV1 81% and FEV1/FVC 99% on 01/04/2020.  Continue current medication regimen and adjust as needed based on symptoms.  Controlled  at this time with no recent exacerbation.  Return in 6 months for follow-up.        Digestive   GERD (gastroesophageal reflux disease)    Chronic, ongoing. Continue Prilosec and recommend she take this daily as ordered.  Recommend she follow-up with GI as was recommended.        Endocrine   Hypothyroidism    Chronic, ongoing.  Continue current medication regimen and adjust as needed.  TSH stable on recent labs.      IFG (impaired fasting glucose)    Check A1c today and initiate medications as needed.      Relevant Orders   Bayer DCA Hb A1c Waived   Basic metabolic panel   Microalbumin, Urine Waived     Other   Hyperlipidemia    Chronic, ongoing.  Continue statin and adjust dose as needed. Lipid panel today.      Relevant  Orders   Lipid Panel w/o Chol/HDL Ratio   Other Visit Diagnoses     Fall, initial encounter       After episode of dizziness and dog jumping on her -- monitor her and if ongoing dizzy episodes then return to office.        Follow up plan: Return in about 6 months (around 07/05/2021) for Annual physical.

## 2021-01-04 NOTE — Assessment & Plan Note (Signed)
Check A1c today and initiate medications as needed.

## 2021-01-04 NOTE — Assessment & Plan Note (Signed)
Chronic, ongoing.  Continue statin and adjust dose as needed. Lipid panel today. 

## 2021-01-04 NOTE — Patient Instructions (Signed)
Heart-Healthy Eating Plan Many factors influence your heart (coronary) health, including eating and exercise habits. Coronary risk increases with abnormal blood fat (lipid) levels. Heart-healthy meal planning includes limiting unhealthy fats, increasing healthy fats, and making other diet and lifestyle changes. What is my plan? Your health care provider may recommend that you: Limit your fat intake to _________% or less of your total calories each day. Limit your saturated fat intake to _________% or less of your total calories each day. Limit the amount of cholesterol in your diet to less than _________ mg per day. What are tips for following this plan? Cooking Cook foods using methods other than frying. Baking, boiling, grilling, and broiling are all good options. Other ways to reduce fat include: Removing the skin from poultry. Removing all visible fats from meats. Steaming vegetables in water or broth. Meal planning  At meals, imagine dividing your plate into fourths: Fill one-half of your plate with vegetables and green salads. Fill one-fourth of your plate with whole grains. Fill one-fourth of your plate with lean protein foods. Eat 4-5 servings of vegetables per day. One serving equals 1 cup raw or cooked vegetable, or 2 cups raw leafy greens. Eat 4-5 servings of fruit per day. One serving equals 1 medium whole fruit,  cup dried fruit,  cup fresh, frozen, or canned fruit, or  cup 100% fruit juice. Eat more foods that contain soluble fiber. Examples include apples, broccoli, carrots, beans, peas, and barley. Aim to get 25-30 g of fiber per day. Increase your consumption of legumes, nuts, and seeds to 4-5 servings per week. One serving of dried beans or legumes equals  cup cooked, 1 serving of nuts is  cup, and 1 serving of seeds equals 1 tablespoon. Fats Choose healthy fats more often. Choose monounsaturated and polyunsaturated fats, such as olive and canola oils, flaxseeds,  walnuts, almonds, and seeds. Eat more omega-3 fats. Choose salmon, mackerel, sardines, tuna, flaxseed oil, and ground flaxseeds. Aim to eat fish at least 2 times each week. Check food labels carefully to identify foods with trans fats or high amounts of saturated fat. Limit saturated fats. These are found in animal products, such as meats, butter, and cream. Plant sources of saturated fats include palm oil, palm kernel oil, and coconut oil. Avoid foods with partially hydrogenated oils in them. These contain trans fats. Examples are stick margarine, some tub margarines, cookies, crackers, and other baked goods. Avoid fried foods. General information Eat more home-cooked food and less restaurant, buffet, and fast food. Limit or avoid alcohol. Limit foods that are high in starch and sugar. Lose weight if you are overweight. Losing just 5-10% of your body weight can help your overall health and prevent diseases such as diabetes and heart disease. Monitor your salt (sodium) intake, especially if you have high blood pressure. Talk with your health care provider about your sodium intake. Try to incorporate more vegetarian meals weekly. What foods can I eat? Fruits All fresh, canned (in natural juice), or frozen fruits. Vegetables Fresh or frozen vegetables (raw, steamed, roasted, or grilled). Green salads. Grains Most grains. Choose whole wheat and whole grains most of the time. Rice and pasta, including brown rice and pastas made with whole wheat. Meats and other proteins Lean, well-trimmed beef, veal, pork, and lamb. Chicken and turkey without skin. All fish and shellfish. Wild duck, rabbit, pheasant, and venison. Egg whites or low-cholesterol egg substitutes. Dried beans, peas, lentils, and tofu. Seeds and most nuts. Dairy Low-fat or nonfat cheeses,   including ricotta and mozzarella. Skim or 1% milk (liquid, powdered, or evaporated). Buttermilk made with low-fat milk. Nonfat or low-fat  yogurt. Fats and oils Non-hydrogenated (trans-free) margarines. Vegetable oils, including soybean, sesame, sunflower, olive, peanut, safflower, corn, canola, and cottonseed. Salad dressings or mayonnaise made with a vegetable oil. Beverages Water (mineral or sparkling). Coffee and tea. Diet carbonated beverages. Sweets and desserts Sherbet, gelatin, and fruit ice. Small amounts of dark chocolate. Limit all sweets and desserts. Seasonings and condiments All seasonings and condiments. The items listed above may not be a complete list of foods and beverages you can eat. Contact a dietitian for more options. What foods are not recommended? Fruits Canned fruit in heavy syrup. Fruit in cream or butter sauce. Fried fruit. Limit coconut. Vegetables Vegetables cooked in cheese, cream, or butter sauce. Fried vegetables. Grains Breads made with saturated or trans fats, oils, or whole milk. Croissants. Sweet rolls. Donuts. High-fat crackers, such as cheese crackers. Meats and other proteins Fatty meats, such as hot dogs, ribs, sausage, bacon, rib-eye roast or steak. High-fat deli meats, such as salami and bologna. Caviar. Domestic duck and goose. Organ meats, such as liver. Dairy Cream, sour cream, cream cheese, and creamed cottage cheese. Whole-milk cheeses. Whole or 2% milk (liquid, evaporated, or condensed). Whole buttermilk. Cream sauce or high-fat cheese sauce. Whole-milk yogurt. Fats and oils Meat fat, or shortening. Cocoa butter, hydrogenated oils, palm oil, coconut oil, palm kernel oil. Solid fats and shortenings, including bacon fat, salt pork, lard, and butter. Nondairy cream substitutes. Salad dressings with cheese or sour cream. Beverages Regular sodas and any drinks with added sugar. Sweets and desserts Frosting. Pudding. Cookies. Cakes. Pies. Milk chocolate or white chocolate. Buttered syrups. Full-fat ice cream or ice cream drinks. The items listed above may not be a complete list of  foods and beverages to avoid. Contact a dietitian for more information. Summary Heart-healthy meal planning includes limiting unhealthy fats, increasing healthy fats, and making other diet and lifestyle changes. Lose weight if you are overweight. Losing just 5-10% of your body weight can help your overall health and prevent diseases such as diabetes and heart disease. Focus on eating a balance of foods, including fruits and vegetables, low-fat or nonfat dairy, lean protein, nuts and legumes, whole grains, and heart-healthy oils and fats. This information is not intended to replace advice given to you by your health care provider. Make sure you discuss any questions you have with your health care provider. Document Revised: 05/31/2020 Document Reviewed: 05/31/2020 Elsevier Patient Education  2022 Elsevier Inc.  

## 2021-01-04 NOTE — Assessment & Plan Note (Signed)
Chronic, ongoing.  FEV1 81% and FEV1/FVC 99% on 01/04/2020.  Continue current medication regimen and adjust as needed based on symptoms.  Controlled at this time with no recent exacerbation.  Return in 6 months for follow-up.

## 2021-01-04 NOTE — Assessment & Plan Note (Signed)
Chronic, ongoing.  Continue current medication regimen and adjust as needed.  TSH stable on recent labs.

## 2021-01-04 NOTE — Assessment & Plan Note (Signed)
Chronic, ongoing. Continue Prilosec and recommend she take this daily as ordered.  Recommend she follow-up with GI as was recommended.

## 2021-01-05 ENCOUNTER — Other Ambulatory Visit: Payer: Self-pay | Admitting: Nurse Practitioner

## 2021-01-05 LAB — BASIC METABOLIC PANEL
BUN/Creatinine Ratio: 9 — ABNORMAL LOW (ref 12–28)
BUN: 7 mg/dL — ABNORMAL LOW (ref 8–27)
CO2: 25 mmol/L (ref 20–29)
Calcium: 9.4 mg/dL (ref 8.7–10.3)
Chloride: 103 mmol/L (ref 96–106)
Creatinine, Ser: 0.77 mg/dL (ref 0.57–1.00)
Glucose: 104 mg/dL — ABNORMAL HIGH (ref 70–99)
Potassium: 4.2 mmol/L (ref 3.5–5.2)
Sodium: 141 mmol/L (ref 134–144)
eGFR: 87 mL/min/{1.73_m2} (ref >59)

## 2021-01-05 LAB — LIPID PANEL W/O CHOL/HDL RATIO
Cholesterol, Total: 182 mg/dL (ref 100–199)
HDL: 55 mg/dL (ref >39)
LDL Chol Calc (NIH): 100 mg/dL — ABNORMAL HIGH (ref 0–99)
Triglycerides: 155 mg/dL — ABNORMAL HIGH (ref 0–149)
VLDL Cholesterol Cal: 27 mg/dL (ref 5–40)

## 2021-01-05 MED ORDER — ROSUVASTATIN CALCIUM 40 MG PO TABS: 40.0000 mg | ORAL_TABLET | Freq: Every day | ORAL | 4 refills | Status: DC

## 2021-01-05 NOTE — Progress Notes (Signed)
Contacted via [facility] -- please schedule office visit for 3 months, currently she only has 6 month follow-up and will need to see sooner Good morning Wanette, your labs have returned, I have a couple concerns: - The A1C is the diabetes testing we talked about, this looks at your blood sugars over the past 3 months and turns the average into a number.  Your number is 6.5%, meaning you are just in the diabetic range.  Any number 5.7 to 6.4 is considered prediabetes and any number 6.5 or greater is considered diabetes.   I would recommend heavy focus on decreasing foods high in sugar and your intake of things like bread products, pasta, and rice.  The American Diabetes Association online has a large amount of information on diet changes to make.  We will recheck this number in 3 months -- if you remain elevated I will recommend starting medication for diabetes. - Cholesterol levels continue to show LDL above goal, would like to see if below 70 for stroke prevention.  I would like to stop Simvastatin and change you to Rosuvastatin 40 MG.  I will send this in and we will recheck labs next visit. - Kidney function, creatinine and eGFR, remains stable.  Any questions? Keep being amazing!!  Thank you for allowing me to participate in your care.  I appreciate you. Kindest regards, Bentzion Dauria

## 2021-02-15 ENCOUNTER — Telehealth: Payer: Self-pay | Admitting: Nurse Practitioner

## 2021-02-15 NOTE — Telephone Encounter (Signed)
Copied from CRM [phone] [id number]. Topic: General - Other >> Feb 15, 2021  9:02 AM Herby Abraham C wrote: Reason for CRM: pt is called in to ask if she can stop by the office to complete a urine sample? Pt says that she is having burning when urinating. Pt feels that she may have a bladder infection or UTI.   CB: [phone] -- please assist pt further.

## 2021-02-15 NOTE — Telephone Encounter (Signed)
Left message asking pt to call back to schedule an appt. °

## 2021-02-17 DIAGNOSIS — E119 Type 2 diabetes mellitus without complications: Secondary | ICD-10-CM | POA: Insufficient documentation

## 2021-02-17 DIAGNOSIS — R7309 Other abnormal glucose: Secondary | ICD-10-CM | POA: Insufficient documentation

## 2021-02-20 ENCOUNTER — Ambulatory Visit: Payer: 59 | Admitting: Nurse Practitioner

## 2021-02-20 DIAGNOSIS — R399 Unspecified symptoms and signs involving the genitourinary system: Secondary | ICD-10-CM

## 2021-03-19 ENCOUNTER — Other Ambulatory Visit: Payer: Self-pay | Admitting: Nurse Practitioner

## 2021-03-20 NOTE — Telephone Encounter (Signed)
Refilled 07/04/2020 #90 with 4 refills - enough to last until 10/2021. Requested Prescriptions  Pending Prescriptions Disp Refills   montelukast (SINGULAIR) 10 MG tablet [Pharmacy Med Name: MONTELUKAST 10MG  TABLETS] 90 tablet 4    Sig: TAKE 1 TABLET AT BEDTIME     Pulmonology:  Leukotriene Inhibitors Passed - 03/19/2021  7:20 PM      Passed - Valid encounter within last 12 months    Recent Outpatient Visits          2 months ago Persistent asthma without complication, unspecified asthma severity   [facility] [place], Jolene T, NP   8 months ago Hypothyroidism, unspecified type   [facility] [place], Trosky T, NP   1 year ago Mild persistent asthma without complication   [facility] [place], [place], NP   1 year ago Encounter for annual physical exam   [facility] [place], [place] T, NP   2 years ago Hypothyroidism, unspecified type   [facility] [place], [place], NP      Future Appointments            In 2 weeks [place], Dorie Rank, NP Dorie Rank, [facility]

## 2021-04-07 NOTE — Patient Instructions (Signed)
Prediabetes Eating Plan °Prediabetes is a condition that causes blood sugar (glucose) levels to be higher than normal. This increases the risk for developing type 2 diabetes (type 2 diabetes mellitus). Working with a health care provider or nutrition specialist (dietitian) to make diet and lifestyle changes can help prevent the onset of diabetes. These changes may help you: °Control your blood glucose levels. °Improve your cholesterol levels. °Manage your blood pressure. °What are tips for following this plan? °Reading food labels °Read food labels to check the amount of fat, salt (sodium), and sugar in prepackaged foods. Avoid foods that have: °Saturated fats. °Trans fats. °Added sugars. °Avoid foods that have more than 300 milligrams (mg) of sodium per serving. Limit your sodium intake to less than 2,300 mg each day. °Shopping °Avoid buying pre-made and processed foods. °Avoid buying drinks with added sugar. °Cooking °Cook with olive oil. Do not use butter, lard, or ghee. °Bake, broil, grill, steam, or boil foods. Avoid frying. °Meal planning ° °Work with your dietitian to create an eating plan that is right for you. This may include tracking how many calories you take in each day. Use a food diary, notebook, or mobile application to track what you eat at each meal. °Consider following a Mediterranean diet. This includes: °Eating several servings of fresh fruits and vegetables each day. °Eating fish at least twice a week. °Eating one serving each day of whole grains, beans, nuts, and seeds. °Using olive oil instead of other fats. °Limiting alcohol. °Limiting red meat. °Using nonfat or low-fat dairy products. °Consider following a plant-based diet. This includes dietary choices that focus on eating mostly vegetables and fruit, grains, beans, nuts, and seeds. °If you have high blood pressure, you may need to limit your sodium intake or follow a diet such as the DASH (Dietary Approaches to Stop Hypertension) eating  plan. The DASH diet aims to lower high blood pressure. °Lifestyle °Set weight loss goals with help from your health care team. It is recommended that most people with prediabetes lose 7% of their body weight. °Exercise for at least 30 minutes 5 or more days a week. °Attend a support group or seek support from a mental health counselor. °Take over-the-counter and prescription medicines only as told by your health care provider. °What foods are recommended? °Fruits °Berries. Bananas. Apples. Oranges. Grapes. Papaya. Mango. Pomegranate. Kiwi. Grapefruit. Cherries. °Vegetables °Lettuce. Spinach. Peas. Beets. Cauliflower. Cabbage. Broccoli. Carrots. Tomatoes. Squash. Eggplant. Herbs. Peppers. Onions. Cucumbers. Brussels sprouts. °Grains °Whole grains, such as whole-wheat or whole-grain breads, crackers, cereals, and pasta. Unsweetened oatmeal. Bulgur. Barley. Quinoa. Brown rice. Corn or whole-wheat flour tortillas or taco shells. °Meats and other proteins °Seafood. Poultry without skin. Lean cuts of pork and beef. Tofu. Eggs. Nuts. Beans. °Dairy °Low-fat or fat-free dairy products, such as yogurt, cottage cheese, and cheese. °Beverages °Water. Tea. Coffee. Sugar-free or diet soda. Seltzer water. Low-fat or nonfat milk. Milk alternatives, such as soy or almond milk. °Fats and oils °Olive oil. Canola oil. Sunflower oil. Grapeseed oil. Avocado. Walnuts. °Sweets and desserts °Sugar-free or low-fat pudding. Sugar-free or low-fat ice cream and other frozen treats. °Seasonings and condiments °Herbs. Sodium-free spices. Mustard. Relish. Low-salt, low-sugar ketchup. Low-salt, low-sugar barbecue sauce. Low-fat or fat-free mayonnaise. °The items listed above may not be a complete list of recommended foods and beverages. Contact a dietitian for more information. °What foods are not recommended? °Fruits °Fruits canned with syrup. °Vegetables °Canned vegetables. Frozen vegetables with butter or cream sauce. °Grains °Refined white  flour and flour   products, such as bread, pasta, snack foods, and cereals. °Meats and other proteins °Fatty cuts of meat. Poultry with skin. Breaded or fried meat. Processed meats. °Dairy °Full-fat yogurt, cheese, or milk. °Beverages °Sweetened drinks, such as iced tea and soda. °Fats and oils °Butter. Lard. Ghee. °Sweets and desserts °Baked goods, such as cake, cupcakes, pastries, cookies, and cheesecake. °Seasonings and condiments °Spice mixes with added salt. Ketchup. Barbecue sauce. Mayonnaise. °The items listed above may not be a complete list of foods and beverages that are not recommended. Contact a dietitian for more information. °Where to find more information °American Diabetes Association: www.diabetes.org °Summary °You may need to make diet and lifestyle changes to help prevent the onset of diabetes. These changes can help you control blood sugar, improve cholesterol levels, and manage blood pressure. °Set weight loss goals with help from your health care team. It is recommended that most people with prediabetes lose 7% of their body weight. °Consider following a Mediterranean diet. This includes eating plenty of fresh fruits and vegetables, whole grains, beans, nuts, seeds, fish, and low-fat dairy, and using olive oil instead of other fats. °This information is not intended to replace advice given to you by your health care provider. Make sure you discuss any questions you have with your health care provider. °Document Revised: 04/21/2019 Document Reviewed: 04/21/2019 °Elsevier Patient Education © 2022 [employer]. ° °

## 2021-04-08 ENCOUNTER — Other Ambulatory Visit: Payer: Self-pay

## 2021-04-08 ENCOUNTER — Ambulatory Visit (INDEPENDENT_AMBULATORY_CARE_PROVIDER_SITE_OTHER): Payer: 59 | Admitting: Nurse Practitioner

## 2021-04-08 ENCOUNTER — Encounter: Payer: Self-pay | Admitting: Nurse Practitioner

## 2021-04-08 VITALS — BP 103/67 | HR 85 | Ht <= 58 in | Wt 117.8 lb

## 2021-04-08 DIAGNOSIS — R7309 Other abnormal glucose: Secondary | ICD-10-CM

## 2021-04-08 DIAGNOSIS — Z760 Encounter for issue of repeat prescription: Secondary | ICD-10-CM

## 2021-04-08 DIAGNOSIS — E782 Mixed hyperlipidemia: Secondary | ICD-10-CM

## 2021-04-08 LAB — MICROALBUMIN, URINE WAIVED
Creatinine, Urine Waived: 100 mg/dL (ref 10–300)
Microalb, Ur Waived: 10 mg/L (ref 0–19)
Microalb/Creat Ratio: <30 mg/g (ref <30)

## 2021-04-08 LAB — BAYER DCA HB A1C WAIVED: HB A1C (BAYER DCA - WAIVED): 6.4 % — ABNORMAL HIGH (ref 4.8–5.6)

## 2021-04-08 MED ORDER — MONTELUKAST SODIUM 10 MG PO TABS: 10.0000 mg | ORAL_TABLET | Freq: Every day | ORAL | 4 refills | Status: DC

## 2021-04-08 NOTE — Progress Notes (Signed)
? ?BP 103/67   Pulse 85   Ht 4' 8.7" (1.44 m)   Wt 117 lb 12.8 oz (53.4 kg)   LMP 01/18/2015 (Approximate)   SpO2 98%   BMI 25.76 kg/m?   ? ?Subjective:  ? ? Patient ID: Denise Proctor, female    DOB: August 03, 1960, 61 y.o.   MRN: [id number] ? ?HPI: ?Denise Proctor is a 61 y.o. female ? ?Chief Complaint  ?Patient presents with  ? Hyperlipidemia  ? Medication Refill  ?  Patient is requesting a refill on her Singulair prescription.   ? ?HYPERLIPIDEMIA ?Changed to Crestor recent visit -- no ADR with this. ?Hyperlipidemia status: good compliance ?Satisfied with current treatment?  yes ?Side effects:  no ?Medication compliance: good compliance ?Past cholesterol meds: Atorvastatin ?Supplements: none ?Aspirin:  no ?The 10-year ASCVD risk score (Arnett DK, et al., 2019) is: 2.5% ?  Values used to calculate the score: ?    Age: 61 years ?    Sex: Female ?    Is Non-Hispanic African American: No ?    Diabetic: No ?    Tobacco smoker: No ?    Systolic Blood Pressure: 998 mmHg ?    Is BP treated: No ?    HDL Cholesterol: 55 mg/dL ?    Total Cholesterol: 182 mg/dL ?Chest pain:  no ?Coronary artery disease:  no ?Family history CAD:  no ?Family history early CAD:  no ? ?PREDIABETES ?Last A1c creeping up to 6.5%, discussed at length with her.  She loves [employer]eps[employer] and has been cutting back on this.   ?Polydipsia/polyuria: no ?Visual disturbance: no ?Chest pain: no ?Paresthesias: no ? ?Relevant past medical, surgical, family and social history reviewed and updated as indicated. Interim medical history since our last visit reviewed. ?Allergies and medications reviewed and updated. ? ?Review of Systems  ?Constitutional:  Negative for activity change, appetite change, diaphoresis, fatigue and fever.  ?Respiratory:  Negative for cough, chest tightness and shortness of breath.   ?Cardiovascular:  Negative for chest pain, palpitations and leg swelling.  ?Gastrointestinal: Negative.   ?Endocrine: Negative for cold intolerance and heat  intolerance.  ?Neurological: Negative.   ?Psychiatric/Behavioral: Negative.    ? ?Per HPI unless specifically indicated above ? ?   ?Objective:  ?  ?BP 103/67   Pulse 85   Ht 4' 8.7" (1.44 m)   Wt 117 lb 12.8 oz (53.4 kg)   LMP 01/18/2015 (Approximate)   SpO2 98%   BMI 25.76 kg/m?   ?Wt Readings from Last 3 Encounters:  ?04/08/21 117 lb 12.8 oz (53.4 kg)  ?01/04/21 123 lb 6.4 oz (56 kg)  ?07/04/20 118 lb (53.5 kg)  ?  ?Physical Exam ?Vitals and nursing note reviewed.  ?Constitutional:   ?   General: She is awake. She is not in acute distress. ?   Appearance: She is well-developed. She is not ill-appearing.  ?HENT:  ?   Head: Normocephalic.  ?   Right Ear: Hearing normal.  ?   Left Ear: Hearing normal.  ?   Nose: Nose normal.  ?   Mouth/Throat:  ?   Mouth: Mucous membranes are moist.  ?Eyes:  ?   General: Lids are normal.     ?   Right eye: No discharge.     ?   Left eye: No discharge.  ?   Conjunctiva/sclera: Conjunctivae normal.  ?   Pupils: Pupils are equal, round, and reactive to light.  ?Neck:  ?   Thyroid: No thyromegaly.  ?  Vascular: No carotid bruit.  ?Cardiovascular:  ?   Rate and Rhythm: Normal rate and regular rhythm.  ?   Heart sounds: Normal heart sounds. No murmur heard. ?  No gallop.  ?Pulmonary:  ?   Effort: Pulmonary effort is normal. No accessory muscle usage or respiratory distress.  ?   Breath sounds: Normal breath sounds.  ?Abdominal:  ?   General: Bowel sounds are normal.  ?   Palpations: Abdomen is soft.  ?Musculoskeletal:  ?   Cervical back: Normal range of motion and neck supple.  ?   Right lower leg: No edema.  ?   Left lower leg: No edema.  ?Skin: ?   General: Skin is warm and dry.  ?Neurological:  ?   Mental Status: She is alert and oriented to person, place, and time.  ?Psychiatric:     ?   Attention and Perception: Attention normal.     ?   Mood and Affect: Mood normal.     ?   Behavior: Behavior normal. Behavior is cooperative.     ?   Thought Content: Thought content normal.      ?   Judgment: Judgment normal.  ? ? ?Results for orders placed or performed in visit on 01/04/21  ?Bayer DCA Hb A1c Waived  ?Result Value Ref Range  ? HB A1C (BAYER DCA - WAIVED) 6.5 (H) 4.8 - 5.6 %  ?Basic metabolic panel  ?Result Value Ref Range  ? Glucose 104 (H) 70 - 99 mg/dL  ? BUN 7 (L) 8 - 27 mg/dL  ? Creatinine, Ser 0.77 0.57 - 1.00 mg/dL  ? eGFR 87 >59 mL/min/1.73  ? BUN/Creatinine Ratio 9 (L) 12 - 28  ? Sodium 141 134 - 144 mmol/L  ? Potassium 4.2 3.5 - 5.2 mmol/L  ? Chloride 103 96 - 106 mmol/L  ? CO2 25 20 - 29 mmol/L  ? Calcium 9.4 8.7 - 10.3 mg/dL  ?Microalbumin, Urine Waived  ?Result Value Ref Range  ? Microalb, Ur Waived 10 0 - 19 mg/L  ? Creatinine, Urine Waived 50 10 - 300 mg/dL  ? Microalb/Creat Ratio <30 <30 mg/g  ?Lipid Panel w/o Chol/HDL Ratio  ?Result Value Ref Range  ? Cholesterol, Total 182 100 - 199 mg/dL  ? Triglycerides 155 (H) 0 - 149 mg/dL  ? HDL 55 >39 mg/dL  ? VLDL Cholesterol Cal 27 5 - 40 mg/dL  ? LDL Chol Calc (NIH) 100 (H) 0 - 99 mg/dL  ? ?   ?Assessment & Plan:  ? ?Problem List Items Addressed This Visit   ? ?  ? Other  ? Elevated hemoglobin A1c measurement - Primary  ?  Previous check was 6.5% and today is trending down with diet changes to 6.4%.  Has family history of diabetes.  Recommend continued focus on diet and exercise.  Goal is to avoid medication initiation. ?  ?  ? Relevant Orders  ? Bayer DCA Hb A1c Waived  ? Microalbumin, Urine Waived  ? Basic metabolic panel  ? Hyperlipidemia  ?  Chronic, ongoing.  Continue statin, Rosuvastatin at this time, and adjust dose as needed. Lipid panel today. ?  ?  ? Relevant Orders  ? Lipid Panel w/o Chol/HDL Ratio  ? ?Other Visit Diagnoses   ? ? Medication refill      ? Singulair sent in  ? ?  ?  ? ?Follow up plan: ?Return in about 18 weeks (around 08/12/2021) for Annual physical. ? ? ? ? ? ?

## 2021-04-08 NOTE — Assessment & Plan Note (Signed)
Previous check was 6.5% and today is trending down with diet changes to 6.4%.  Has family history of diabetes.  Recommend continued focus on diet and exercise.  Goal is to avoid medication initiation. ?

## 2021-04-08 NOTE — Assessment & Plan Note (Signed)
Chronic, ongoing.  Continue statin, Rosuvastatin at this time, and adjust dose as needed. Lipid panel today. ?

## 2021-04-09 LAB — BASIC METABOLIC PANEL
BUN/Creatinine Ratio: 10 — ABNORMAL LOW (ref 12–28)
BUN: 8 mg/dL (ref 8–27)
CO2: 23 mmol/L (ref 20–29)
Calcium: 9.4 mg/dL (ref 8.7–10.3)
Chloride: 105 mmol/L (ref 96–106)
Creatinine, Ser: 0.78 mg/dL (ref 0.57–1.00)
Glucose: 100 mg/dL — ABNORMAL HIGH (ref 70–99)
Potassium: 4.4 mmol/L (ref 3.5–5.2)
Sodium: 143 mmol/L (ref 134–144)
eGFR: 86 mL/min/{1.73_m2} (ref >59)

## 2021-04-09 LAB — LIPID PANEL W/O CHOL/HDL RATIO
Cholesterol, Total: 168 mg/dL (ref 100–199)
HDL: 50 mg/dL (ref >39)
LDL Chol Calc (NIH): 93 mg/dL (ref 0–99)
Triglycerides: 143 mg/dL (ref 0–149)
VLDL Cholesterol Cal: 25 mg/dL (ref 5–40)

## 2021-04-09 NOTE — Progress Notes (Signed)
Contacted via [place] ? ? ?Good evening Denise Proctor, your labs have returned.  Kidney function, creatinine and eGFR, remains normal, as is liver function, AST and ALT.  Cholesterol levels trending down with Rosuvastatin, continue this and we will recheck fasting labs next visit.  Any questions? ?Keep being wonderful!!  Thank you for allowing me to participate in your care.  I appreciate you. ?Kindest regards, ?Bonne Whack ?

## 2021-06-22 ENCOUNTER — Other Ambulatory Visit: Payer: Self-pay

## 2021-06-22 ENCOUNTER — Emergency Department: Payer: 59

## 2021-06-22 ENCOUNTER — Emergency Department
Admission: EM | Admit: 2021-06-22 | Discharge: 2021-06-22 | Disposition: A | Discharge status: Home / Self Care | Payer: 59 | Attending: Emergency Medicine | Admitting: Emergency Medicine

## 2021-06-22 ENCOUNTER — Encounter: Payer: Self-pay | Admitting: Emergency Medicine

## 2021-06-22 DIAGNOSIS — E039 Hypothyroidism, unspecified: Secondary | ICD-10-CM | POA: Insufficient documentation

## 2021-06-22 DIAGNOSIS — R42 Dizziness and giddiness: Secondary | ICD-10-CM | POA: Diagnosis not present

## 2021-06-22 DIAGNOSIS — J45909 Unspecified asthma, uncomplicated: Secondary | ICD-10-CM | POA: Diagnosis not present

## 2021-06-22 DIAGNOSIS — Z79899 Other long term (current) drug therapy: Secondary | ICD-10-CM | POA: Diagnosis not present

## 2021-06-22 DIAGNOSIS — R55 Syncope and collapse: Secondary | ICD-10-CM | POA: Insufficient documentation

## 2021-06-22 DIAGNOSIS — R519 Headache, unspecified: Secondary | ICD-10-CM | POA: Diagnosis not present

## 2021-06-22 LAB — URINALYSIS, ROUTINE W REFLEX MICROSCOPIC
Bilirubin Urine: NEGATIVE
Glucose, UA: NEGATIVE mg/dL
Hgb urine dipstick: NEGATIVE
Ketones, ur: 20 mg/dL — AB
Leukocytes,Ua: NEGATIVE
Nitrite: NEGATIVE
Protein, ur: NEGATIVE mg/dL
Specific Gravity, Urine: 1.013 (ref 1.005–1.030)
pH: 9 — ABNORMAL HIGH (ref 5.0–8.0)

## 2021-06-22 LAB — CBC
HCT: 42.2 % (ref 36.0–46.0)
Hemoglobin: 13.7 g/dL (ref 12.0–15.0)
MCH: 28.5 pg (ref 26.0–34.0)
MCHC: 32.5 g/dL (ref 30.0–36.0)
MCV: 87.9 fL (ref 80.0–100.0)
Platelets: 372 10*3/uL (ref 150–400)
RBC: 4.8 MIL/uL (ref 3.87–5.11)
RDW: 13.8 % (ref 11.5–15.5)
WBC: 9 10*3/uL (ref 4.0–10.5)
nRBC: 0 % (ref 0.0–0.2)

## 2021-06-22 LAB — BASIC METABOLIC PANEL WITH GFR
Anion gap: 10 (ref 5–15)
BUN: 11 mg/dL (ref 8–23)
CO2: 22 mmol/L (ref 22–32)
Calcium: 9 mg/dL (ref 8.9–10.3)
Chloride: 106 mmol/L (ref 98–111)
Creatinine, Ser: 0.77 mg/dL (ref 0.44–1.00)
GFR, Estimated: >60 mL/min
Glucose, Bld: 140 mg/dL — ABNORMAL HIGH (ref 70–99)
Potassium: 3.6 mmol/L (ref 3.5–5.1)
Sodium: 138 mmol/L (ref 135–145)

## 2021-06-22 LAB — TROPONIN I (HIGH SENSITIVITY)
Troponin I (High Sensitivity): 4 ng/L (ref <18)
Troponin I (High Sensitivity): 5 ng/L

## 2021-06-22 NOTE — ED Triage Notes (Addendum)
Pt presents to ED from home via EMS with nausea and syncopal episode while working in her yard.  Pt reports she was weed-eating and stopped to fix the weed-eater and the next thing she knew she was waking up on the ground.  Similar symptoms approx 6 months ago but pt reports this one feels different because she has been feeling dizzy longer after waking up. Denies headache, speech clear. Pt states her left arm "feels funky". Denies chest pain. VS per EMS - BP 126/80 HR 112 O2 100%     FSBS 154

## 2021-06-22 NOTE — ED Provider Notes (Signed)
[facility] Provider Note  Patient Contact: 4:05 PM (approximate)   History   Loss of Consciousness   HPI  Denise Proctor is a 61 y.o. female who presents the emergency department complaining of a syncopal episode.  Patient states that she was working outside trimming a bush.  She had a problem with the clipper, bent over to address it when she became reportedly lightheaded and passed out.  Patient states that this happened 2 or 3 months ago that she was not seen at that time for same.  She states that she was essentially in a sitting position when this happened, sustained no other injuries.  She does feel dizzy, have a slight headache and was initially complaining of some tingling in her left arm.  The left arm sensation has gone away.  She denied any chest pain, shortness of breath.  She does have a history of hypothyroidism for which she takes Synthroid, GERD, asthma.  She states that she recently had routine blood work from her primary care with no abnormalities with her labs.  No new medications.  She denies any visual changes, unilateral weakness, slurred speech, difficulty forming thoughts or words.  Patient states that she feels roughly at her baseline at this time.     Physical Exam   Triage Vital Signs: ED Triage Vitals  Enc Vitals Group     BP 06/22/21 1318 128/75     Pulse Rate 06/22/21 1318 86     Resp 06/22/21 1318 18     Temp 06/22/21 1318 98.4 F (36.9 C)     Temp Source 06/22/21 1318 Oral     SpO2 06/22/21 1318 99 %     Weight 06/22/21 1313 119 lb (54 kg)     Height 06/22/21 1313 4\' 9"  (1.448 m)     Head Circumference --      Peak Flow --      Pain Score 06/22/21 1313 0     Pain Loc --      Pain Edu? --      Excl. in GC? --     Most recent vital signs: Vitals:   06/22/21 1700 06/22/21 1800  BP: 128/74 132/72  Pulse: 82 74  Resp: 15 18  Temp:    SpO2: 97% 100%     General: Alert and in no acute distress. Eyes:  PERRL.  EOMI. Head: No acute traumatic findings ENT:      Ears: No injection or bulging of TMs      Nose: No congestion/rhinnorhea.      Mouth/Throat: Mucous membranes are moist. Neck: No stridor. No cervical spine tenderness to palpation.  No murmurs/bruits Hematological/Lymphatic/Immunilogical: No cervical lymphadenopathy. Cardiovascular:  Good peripheral perfusion.  No appreciable murmurs, rubs, gallops Respiratory: Normal respiratory effort without tachypnea or retractions. Lungs CTAB. Good air entry to the bases with no decreased or absent breath sounds. Gastrointestinal: Bowel sounds 4 quadrants. Soft and nontender to palpation. No guarding or rigidity. No palpable masses. No distention. No CVA tenderness. Musculoskeletal: Full range of motion to all extremities.  Neurologic:  No gross focal neurologic deficits are appreciated.  Skin:   No rash noted Other:   ED Results / Procedures / Treatments   Labs (all labs ordered are listed, but only abnormal results are displayed) Labs Reviewed  BASIC METABOLIC PANEL - Abnormal; Notable for the following components:      Result Value   Glucose, Bld 140 (*)    All other components  within normal limits  URINALYSIS, ROUTINE W REFLEX MICROSCOPIC - Abnormal; Notable for the following components:   Color, Urine YELLOW (*)    APPearance HAZY (*)    pH 9.0 (*)    Ketones, ur 20 (*)    All other components within normal limits  CBC  CBG MONITORING, ED  TROPONIN I (HIGH SENSITIVITY)  TROPONIN I (HIGH SENSITIVITY)     EKG  ED ECG REPORT I, Delorise Royals Benedict Kue,  personally viewed and interpreted this ECG.   Date: 06/22/2021  EKG Time: 1313 hrs.  Rate: 87 bpm  Rhythm: there are no previous tracings available for comparison, normal sinus rhythm  Axis: Normal axis  Intervals:none  ST&T Change: No ST elevation or depression noted  Normal sinus rhythm.  No STEMI.  No previous EKG for comparison.    RADIOLOGY  I personally viewed,  evaluated, and interpreted these images as part of my medical decision making, as well as reviewing the written report by the radiologist.  ED Provider Interpretation: No acute cardiopulmonary finding on chest x-ray.  CT scan reveals no acute findings, what appears to be a chronic basal ganglier infarct is identified on imaging.  DG Chest 2 View  Result Date: 06/22/2021 CLINICAL DATA:  Syncope.  Nausea. EXAM: CHEST - 2 VIEW COMPARISON:  08/11/2017 FINDINGS: The cardiomediastinal contours are normal. The lungs are clear. Pulmonary vasculature is normal. No consolidation, pleural effusion, or pneumothorax. No acute osseous abnormalities are seen. IMPRESSION: No acute chest findings. Electronically Signed   By: Narda Rutherford M.D.   On: 06/22/2021 17:01   CT Head Wo Contrast  Result Date: 06/22/2021 CLINICAL DATA:  Dizziness. EXAM: CT HEAD WITHOUT CONTRAST TECHNIQUE: Contiguous axial images were obtained from the base of the skull through the vertex without intravenous contrast. RADIATION DOSE REDUCTION: This exam was performed according to the departmental dose-optimization program which includes automated exposure control, adjustment of the mA and/or kV according to patient size and/or use of iterative reconstruction technique. COMPARISON:  None Available. FINDINGS: Brain: No evidence of acute infarction, hemorrhage, hydrocephalus, extra-axial collection or mass lesion/mass effect. 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 Vascular: No hyperdense vessel or unexpected calcification. Skull: Normal. Negative for fracture or focal lesion. Sinuses/Orbits: No acute finding. Other: None. IMPRESSION: 1. No acute intracranial abnormalities. 2. 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 Electronically Signed   By: Signa Kell M.D.   On: 06/22/2021 17:16    PROCEDURES:  Critical Care  performed: No  Procedures   MEDICATIONS ORDERED IN ED: Medications - No data to display   IMPRESSION / MDM / ASSESSMENT AND PLAN / ED COURSE  I reviewed the triage vital signs and the nursing notes.                              Differential diagnosis includes, but is not limited to, syncope, vasovagal episode, cardiac arrhythmia, STEMI, CVA, dehydration, hypotension, orthostatics    Patient's diagnosis is consistent with syncope.  Patient presented to the emergency department after syncopal episode.  She was working on some bushes, bent over to work on her head:Marland Kitchen  Patient states that she had a brief syncopal episode.  She is back at her baseline at this time, has no headache, vision changes, unilateral weakness, slurred speech, chest pain, shortness of breath, nausea or vomiting.  Neuro exam was reassuring.  Patient  did have a previous episode several months ago as well.  Patient had labs, EKG, chest x-ray, CT scan of the head.  Patient has reassuring labs at this time, EKG is reassuring with no evidence of STEMI, chest x-ray and CT scan are also reassuring.  Given the initial complaint, admission was considered but given the reassuring work-up at this time feel that patient is stable for discharge..  Follow-up primary care as needed, given the fact that she has had 2 episodes of syncope in the last several months I will also refer to cardiology.  Return precautions discussed at length with the patient and her family member who is present in the room at this time.  Patient is given ED precautions to return to the ED for any worsening or new symptoms.        FINAL CLINICAL IMPRESSION(S) / ED DIAGNOSES   Final diagnoses:  Syncope, unspecified syncope type     Rx / DC Orders   ED Discharge Orders     None        Note:  This document was prepared using Dragon voice recognition software and may include unintentional dictation errors.   Lanette Hampshire 06/22/21  Garald Braver, MD 06/22/21 [phone]

## 2021-08-10 NOTE — Patient Instructions (Incomplete)

## 2021-08-12 ENCOUNTER — Encounter: Payer: Self-pay | Admitting: Nurse Practitioner

## 2021-08-12 ENCOUNTER — Ambulatory Visit (INDEPENDENT_AMBULATORY_CARE_PROVIDER_SITE_OTHER): Payer: 59 | Admitting: Nurse Practitioner

## 2021-08-12 VITALS — BP 107/68 | HR 80 | Temp 98.7°F | Ht 58.25 in | Wt 116.8 lb

## 2021-08-12 DIAGNOSIS — E039 Hypothyroidism, unspecified: Secondary | ICD-10-CM | POA: Diagnosis not present

## 2021-08-12 DIAGNOSIS — J3081 Allergic rhinitis due to animal (cat) (dog) hair and dander: Secondary | ICD-10-CM

## 2021-08-12 DIAGNOSIS — E559 Vitamin D deficiency, unspecified: Secondary | ICD-10-CM

## 2021-08-12 DIAGNOSIS — R55 Syncope and collapse: Secondary | ICD-10-CM | POA: Insufficient documentation

## 2021-08-12 DIAGNOSIS — K219 Gastro-esophageal reflux disease without esophagitis: Secondary | ICD-10-CM

## 2021-08-12 DIAGNOSIS — K2289 Other specified disease of esophagus: Secondary | ICD-10-CM

## 2021-08-12 DIAGNOSIS — Z1231 Encounter for screening mammogram for malignant neoplasm of breast: Secondary | ICD-10-CM | POA: Diagnosis not present

## 2021-08-12 DIAGNOSIS — J4530 Mild persistent asthma, uncomplicated: Secondary | ICD-10-CM

## 2021-08-12 DIAGNOSIS — Z0000 Encounter for general adult medical examination without abnormal findings: Secondary | ICD-10-CM | POA: Diagnosis not present

## 2021-08-12 DIAGNOSIS — R7309 Other abnormal glucose: Secondary | ICD-10-CM

## 2021-08-12 DIAGNOSIS — E782 Mixed hyperlipidemia: Secondary | ICD-10-CM

## 2021-08-12 LAB — MICROALBUMIN, URINE WAIVED
Creatinine, Urine Waived: 50 mg/dL (ref 10–300)
Microalb, Ur Waived: 10 mg/L (ref 0–19)

## 2021-08-12 LAB — BAYER DCA HB A1C WAIVED: HB A1C (BAYER DCA - WAIVED): 6.4 % — ABNORMAL HIGH (ref 4.8–5.6)

## 2021-08-12 MED ORDER — LEVOTHYROXINE SODIUM 50 MCG PO TABS: ORAL_TABLET | ORAL | 4 refills | Status: DC

## 2021-08-12 MED ORDER — ALBUTEROL SULFATE HFA 108 (90 BASE) MCG/ACT IN AERS: INHALATION_SPRAY | RESPIRATORY_TRACT | 4 refills | Status: DC

## 2021-08-12 MED ORDER — BUDESONIDE-FORMOTEROL FUMARATE 160-4.5 MCG/ACT IN AERO
INHALATION_SPRAY | RESPIRATORY_TRACT | 4 refills | Status: DC
Start: 2021-08-12 — End: 2022-09-12

## 2021-08-12 MED ORDER — OMEPRAZOLE 20 MG PO CPDR: 20.0000 mg | DELAYED_RELEASE_CAPSULE | Freq: Every day | ORAL | 4 refills | Status: DC

## 2021-08-12 NOTE — Assessment & Plan Note (Signed)
Chronic, stable.  Recommend she start daily supplement and adjust as needed.  Vit D level today.

## 2021-08-12 NOTE — Assessment & Plan Note (Signed)
Acute x 2 episodes recently -- referral to cardiology placed per ER recommendations and labs today.  Recommend to ensure good hydration at home.

## 2021-08-12 NOTE — Assessment & Plan Note (Addendum)
Chronic, ongoing. Continue Prilosec and recommend she take this daily as ordered.  Recommend she follow-up with GI as was recommended. Mag level today.

## 2021-08-12 NOTE — Assessment & Plan Note (Signed)
Chronic, ongoing.  Continue statin and adjust dose as needed. Lipid panel today. 

## 2021-08-12 NOTE — Progress Notes (Signed)
BP 107/68   Pulse 80   Temp 98.7 F (37.1 C) (Oral)   Ht 4' 10.25" (1.48 m)   Wt 116 lb 12.8 oz (53 kg)   LMP 01/18/2015 (Approximate)   SpO2 98%   BMI 24.20 kg/m    Subjective:    Patient ID: Denise Proctor, female    DOB: [birth year], 61 y.o.   MRN: [id number]  HPI: Denise Proctor is a 61 y.o. female presenting on 08/12/2021 for comprehensive medical examination. Current medical complaints include: none  She currently lives with: son Menopausal Symptoms: no   PREDIABETES March 2023 A1c was 6.4%, prior had been 6.5% and she worked on diet. Polydipsia/polyuria: no Visual disturbance: no Chest pain: no Paresthesias: no  HYPOTHYROIDISM Continues on Levothyroxine 50 MCG daily. Vitamin D levels low past labs, she continues on supplement.  Had syncopal episode 06/22/21 (had one similar months prior) -- was taken to ER.  She was referred to cardiology, but has not been able to visit as of yet.  EKG in ER noted on STEMI and CXR + CT scan were reassuring.   Thyroid control status:stable Satisfied with current treatment? yes Medication side effects: no Medication compliance: good compliance Etiology of hypothyroidism:  Recent dose adjustment:no Fatigue: yes Cold intolerance: no Heat intolerance: yes Weight gain: no Weight loss: no Constipation: no Diarrhea/loose stools: no Palpitations: no Lower extremity edema: no Anxiety/depressed mood: yes   ASTHMA Takes Symbicort daily and uses Proair as needed + Singulair daily. Asthma status: stable Satisfied with current treatment?: yes Albuterol/rescue inhaler frequency: a couple times a week with recent air advisories Dyspnea frequency: rarely Wheezing frequency: none Cough frequency: occasional Nocturnal symptom frequency: none Limitation of activity: no Current upper respiratory symptoms: no Triggers: pollen Failed/intolerant to following asthma meds: none Asthma meds in past: Advair Aerochamber/spacer use: no Visits to  ER or Urgent Care in past year: no Pneumovax: Up to Date Influenza: Not up to Date   GERD Has not been back to see GI since July 2020, feline esophagus.  Continues on Omeprazole. GERD control status: stable Satisfied with current treatment? yes Heartburn frequency: none Medication side effects: no  Medication compliance: stable Previous GERD medications: none Dysphagia:  occasional Odynophagia:  no Hematemesis: no Blood in stool: no EGD: yes  HYPERLIPIDEMIA Continues on Crestor daily. Hyperlipidemia status: good compliance Satisfied with current treatment?  yes Side effects:  no Medication compliance: good compliance Past cholesterol meds: none Supplements: none Aspirin:  no The 10-year ASCVD risk score (Arnett DK, et al., 2019) is: 2.7%   Values used to calculate the score:     Age: 45 years     Sex: Female     Is Non-Hispanic African American: No     Diabetic: No     Tobacco smoker: No     Systolic Blood Pressure: 107 mmHg     Is BP treated: No     HDL Cholesterol: 50 mg/dL     Total Cholesterol: 168 mg/dL Chest pain:  no Coronary artery disease:  no Family history CAD:  yes Family history early CAD:  no  Depression Screen done today and results listed below:     08/12/2021    1:13 PM 04/08/2021    1:51 PM 01/04/2021    2:08 PM 01/04/2021    2:05 PM 07/04/2020    9:05 AM  Depression screen PHQ 2/9  Decreased Interest 1 1  0 0  Down, Depressed, Hopeless 0 1 0 0 0  PHQ -  2 Score 1 2 0 0 0  Altered sleeping 1 1 1  0   Tired, decreased energy 1 1 1  0   Change in appetite 0 1 0 0   Feeling bad or failure about yourself  0 0 0 0   Trouble concentrating 0 0 1 0   Moving slowly or fidgety/restless 0 0 0 0   Suicidal thoughts 0 0 0 0   PHQ-9 Score 3 5 3  0   Difficult doing work/chores Not difficult at all  Not difficult at all         06/27/2019    1:02 PM 07/04/2020    9:05 AM 06/22/2021    1:16 PM 08/12/2021    1:12 PM 08/12/2021    1:24 PM  Fall Risk  Falls in  the past year? 0 0  0 1  Was there an injury with Fall? 0 0  0 0  Fall Risk Category Calculator 0 0  0 1  Fall Risk Category Low Low  Low Low  Patient Fall Risk Level Low fall risk Low fall risk Moderate fall risk Low fall risk Low fall risk  Patient at Risk for Falls Due to  No Fall Risks  No Fall Risks History of fall(s)  Fall risk Follow up Falls evaluation completed Falls evaluation completed  Falls evaluation completed Falls evaluation completed    Functional Status Survey: Is the patient deaf or have difficulty hearing?: No Does the patient have difficulty seeing, even when wearing glasses/contacts?: No Does the patient have difficulty concentrating, remembering, or making decisions?: No Does the patient have difficulty walking or climbing stairs?: No Does the patient have difficulty dressing or bathing?: No Does the patient have difficulty doing errands alone such as visiting a doctor's office or shopping?: No   Past Medical History:  Past Medical History:  Diagnosis Date   Anxiety    Asthma    Depression    GERD (gastroesophageal reflux disease)    Hyperlipidemia    Osteopenia    Thyroid disease     Surgical History:  Past Surgical History:  Procedure Laterality Date   COLONOSCOPY WITH PROPOFOL N/A 10/15/2017   Procedure: COLONOSCOPY WITH PROPOFOL;  Surgeon: 10/13/2021, MD;  Location: [facility] ENDOSCOPY;  Service: Endoscopy;  Laterality: N/A;   ESOPHAGOGASTRODUODENOSCOPY (EGD) WITH PROPOFOL N/A 10/15/2017   Procedure: ESOPHAGOGASTRODUODENOSCOPY (EGD) WITH PROPOFOL;  Surgeon: 12/15/2017, MD;  Location: [facility] ENDOSCOPY;  Service: Endoscopy;  Laterality: N/A;   TUBAL LIGATION      Medications:  Current Outpatient Medications on File Prior to Visit  Medication Sig   montelukast (SINGULAIR) 10 MG tablet Take 1 tablet (10 mg total) by mouth at bedtime.   rosuvastatin (CRESTOR) 40 MG tablet Take 1 tablet (40 mg total) by mouth daily.   No current  facility-administered medications on file prior to visit.    Allergies:  No Known Allergies  Social History:  Social History   Socioeconomic History   Marital status: Married    Spouse name: Not on file   Number of children: Not on file   Years of education: Not on file   Highest education level: Not on file  Occupational History   Not on file  Tobacco Use   Smoking status: Never   Smokeless tobacco: Never  Vaping Use   Vaping Use: Never used  Substance and Sexual Activity   Alcohol use: No    Alcohol/week: 0.0 standard drinks of alcohol   Drug use: No  Sexual activity: Yes  Other Topics Concern   Not on file  Social History Narrative   Not on file   Social Determinants of Health   Financial Resource Strain: Low Risk  (07/04/2020)   Overall Financial Resource Strain (CARDIA)    Difficulty of Paying Living Expenses: Not hard at all  Food Insecurity: No Food Insecurity (07/04/2020)   Hunger Vital Sign    Worried About Running Out of Food in the Last Year: Never true    Ran Out of Food in the Last Year: Never true  Transportation Needs: No Transportation Needs (07/04/2020)   PRAPARE - Administrator, Civil Service (Medical): No    Lack of Transportation (Non-Medical): No  Physical Activity: Inactive (07/04/2020)   Exercise Vital Sign    Days of Exercise per Week: 0 days    Minutes of Exercise per Session: 0 min  Stress: No Stress Concern Present (07/04/2020)   [employer] of Occupational Health - Occupational Stress Questionnaire    Feeling of Stress : Only a little  Social Connections: Moderately Integrated (07/04/2020)   Social Connection and Isolation Panel [NHANES]    Frequency of Communication with Friends and Family: More than three times a week    Frequency of Social Gatherings with Friends and Family: More than three times a week    Attends Religious Services: More than 4 times per year    Active Member of [employer] or Organizations: Yes    Attends  Banker Meetings: Never    Marital Status: Divorced  Catering manager Violence: Not At Risk (07/04/2020)   Humiliation, Afraid, Rape, and Kick questionnaire    Fear of Current or Ex-Partner: No    Emotionally Abused: No    Physically Abused: No    Sexually Abused: No   Social History   Tobacco Use  Smoking Status Never  Smokeless Tobacco Never   Social History   Substance and Sexual Activity  Alcohol Use No   Alcohol/week: 0.0 standard drinks of alcohol    Family History:  Family History  Problem Relation Age of Onset   Hypertension Mother    Hyperlipidemia Mother    COPD Mother    Heart disease Father    Diabetes Maternal Grandmother    Cancer Paternal Grandmother        stomach   Stroke Paternal Grandfather    Heart disease Paternal Grandfather    Hyperlipidemia Brother    Diverticulitis Brother    Heart disease Maternal Grandfather    Diverticulitis Brother     Past medical history, surgical history, medications, allergies, family history and social history reviewed with patient today and changes made to appropriate areas of the chart.   Review of Systems - negative All other ROS negative except what is listed above and in the HPI.      Objective:    BP 107/68   Pulse 80   Temp 98.7 F (37.1 C) (Oral)   Ht 4' 10.25" (1.48 m)   Wt 116 lb 12.8 oz (53 kg)   LMP 01/18/2015 (Approximate)   SpO2 98%   BMI 24.20 kg/m   Wt Readings from Last 3 Encounters:  08/12/21 116 lb 12.8 oz (53 kg)  06/22/21 119 lb (54 kg)  04/08/21 117 lb 12.8 oz (53.4 kg)    Physical Exam Constitutional:      General: She is awake. She is not in acute distress.    Appearance: She is well-developed. She is not ill-appearing.  HENT:     Head: Normocephalic and atraumatic.     Right Ear: Hearing, tympanic membrane, ear canal and external ear normal. No drainage.     Left Ear: Hearing, tympanic membrane, ear canal and external ear normal. No drainage.     Nose: Nose  normal.     Right Sinus: No maxillary sinus tenderness or frontal sinus tenderness.     Left Sinus: No maxillary sinus tenderness or frontal sinus tenderness.     Mouth/Throat:     Mouth: Mucous membranes are moist.     Pharynx: Oropharynx is clear. Uvula midline. No pharyngeal swelling, oropharyngeal exudate or posterior oropharyngeal erythema.  Eyes:     General: Lids are normal.        Right eye: No discharge.        Left eye: No discharge.     Extraocular Movements: Extraocular movements intact.     Conjunctiva/sclera: Conjunctivae normal.     Pupils: Pupils are equal, round, and reactive to light.     Visual Fields: Right eye visual fields normal and left eye visual fields normal.  Neck:     Thyroid: No thyromegaly.     Vascular: No carotid bruit.     Trachea: Trachea normal.  Cardiovascular:     Rate and Rhythm: Normal rate and regular rhythm.     Heart sounds: Normal heart sounds. No murmur heard.    No gallop.  Pulmonary:     Effort: Pulmonary effort is normal. No accessory muscle usage or respiratory distress.     Breath sounds: Normal breath sounds.  Chest:  Breasts:    Right: Normal.     Left: Normal.  Abdominal:     General: Bowel sounds are normal.     Palpations: Abdomen is soft. There is no hepatomegaly or splenomegaly.     Tenderness: There is no abdominal tenderness.  Musculoskeletal:        General: Normal range of motion.     Cervical back: Normal range of motion and neck supple.     Right lower leg: No edema.     Left lower leg: No edema.  Lymphadenopathy:     Head:     Right side of head: No submental, submandibular, tonsillar, preauricular or posterior auricular adenopathy.     Left side of head: No submental, submandibular, tonsillar, preauricular or posterior auricular adenopathy.     Cervical: No cervical adenopathy.     Upper Body:     Right upper body: No supraclavicular, axillary or pectoral adenopathy.     Left upper body: No supraclavicular,  axillary or pectoral adenopathy.  Skin:    General: Skin is warm and dry.     Capillary Refill: Capillary refill takes less than 2 seconds.     Findings: No rash.  Neurological:     Mental Status: She is alert and oriented to person, place, and time.     Gait: Gait is intact.     Deep Tendon Reflexes: Reflexes are normal and symmetric.     Reflex Scores:      Brachioradialis reflexes are 2+ on the right side and 2+ on the left side.      Patellar reflexes are 2+ on the right side and 2+ on the left side. Psychiatric:        Attention and Perception: Attention normal.        Mood and Affect: Mood normal.        Speech: Speech normal.  Behavior: Behavior normal. Behavior is cooperative.        Thought Content: Thought content normal.        Judgment: Judgment normal.    Results for orders placed or performed during the hospital encounter of 06/22/21  Basic metabolic panel  Result Value Ref Range   Sodium 138 135 - 145 mmol/L   Potassium 3.6 3.5 - 5.1 mmol/L   Chloride 106 98 - 111 mmol/L   CO2 22 22 - 32 mmol/L   Glucose, Bld 140 (H) 70 - 99 mg/dL   BUN 11 8 - 23 mg/dL   Creatinine, Ser 1.610.77 0.44 - 1.00 mg/dL   Calcium 9.0 8.9 - 09.610.3 mg/dL   GFR, Estimated >04>60 >54>60 mL/min   Anion gap 10 5 - 15  CBC  Result Value Ref Range   WBC 9.0 4.0 - 10.5 K/uL   RBC 4.80 3.87 - 5.11 MIL/uL   Hemoglobin 13.7 12.0 - 15.0 g/dL   HCT 09.842.2 11.936.0 - 14.746.0 %   MCV 87.9 80.0 - 100.0 fL   MCH 28.5 26.0 - 34.0 pg   MCHC 32.5 30.0 - 36.0 g/dL   RDW 82.913.8 56.211.5 - 13.015.5 %   Platelets 372 150 - 400 K/uL   nRBC 0.0 0.0 - 0.2 %  Urinalysis, Routine w reflex microscopic  Result Value Ref Range   Color, Urine YELLOW (A) YELLOW   APPearance HAZY (A) CLEAR   Specific Gravity, Urine 1.013 1.005 - 1.030   pH 9.0 (H) 5.0 - 8.0   Glucose, UA NEGATIVE NEGATIVE mg/dL   Hgb urine dipstick NEGATIVE NEGATIVE   Bilirubin Urine NEGATIVE NEGATIVE   Ketones, ur 20 (A) NEGATIVE mg/dL   Protein, ur NEGATIVE  NEGATIVE mg/dL   Nitrite NEGATIVE NEGATIVE   Leukocytes,Ua NEGATIVE NEGATIVE  Troponin I (High Sensitivity)  Result Value Ref Range   Troponin I (High Sensitivity) 4 <18 ng/L  Troponin I (High Sensitivity)  Result Value Ref Range   Troponin I (High Sensitivity) 5 <18 ng/L      Assessment & Plan:   Problem List Items Addressed This Visit       Respiratory   Allergic rhinitis    Chronic, ongoing in presence of asthma.  Continue daily Singulair and adjust regimen as needed.      Relevant Orders   CBC with Differential/Platelet   Asthma - Primary    Chronic, ongoing.  FEV1 81% and FEV1/FVC 99% on 01/04/2020.  Continue current medication regimen and adjust as needed based on symptoms.  Controlled at this time with no recent exacerbation.  Return in 6 months for follow-up with spirometry.      Relevant Medications   albuterol (VENTOLIN HFA) 108 (90 Base) MCG/ACT inhaler   budesonide-formoterol (SYMBICORT) 160-4.5 MCG/ACT inhaler   Other Relevant Orders   CBC with Differential/Platelet     Digestive   Feline esophagus    Chronic, stable.  Followed by GI, continue this collaboration.      GERD (gastroesophageal reflux disease)    Chronic, ongoing. Continue Prilosec and recommend she take this daily as ordered.  Recommend she follow-up with GI as was recommended. Mag level today.      Relevant Medications   omeprazole (PRILOSEC) 20 MG capsule   Other Relevant Orders   Magnesium     Endocrine   Hypothyroidism    Chronic, ongoing.  Continue current medication regimen and adjust as needed.  Thyroid labs today.      Relevant Medications   levothyroxine (  SYNTHROID) 50 MCG tablet   Other Relevant Orders   TSH   T4, free     Other   Elevated hemoglobin A1c measurement    Previous check was 6.4%, recheck today.  Has family history of diabetes.  Recommend continued focus on diet and exercise.  Goal is to avoid medication initiation.      Relevant Orders   Bayer DCA Hb A1c  Waived   Microalbumin, Urine Waived   Hyperlipidemia    Chronic, ongoing.  Continue statin and adjust dose as needed. Lipid panel today.      Relevant Orders   Comprehensive metabolic panel   Lipid Panel w/o Chol/HDL Ratio   Syncope and collapse    Acute x 2 episodes recently -- referral to cardiology placed per ER recommendations and labs today.  Recommend to ensure good hydration at home.      Relevant Orders   Ambulatory referral to Cardiology   Vitamin D deficiency    Chronic, stable.  Recommend she start daily supplement and adjust as needed.  Vit D level today.      Relevant Orders   VITAMIN D 25 Hydroxy (Vit-D Deficiency, Fractures)   Other Visit Diagnoses     Encounter for screening mammogram for malignant neoplasm of breast       Mammogram ordered.   Relevant Orders   MM 3D SCREEN BREAST BILATERAL   Encounter for annual physical exam       Annual physical with labs today and health maintenance reviewed with patient.        Follow up plan: Return in about 6 months (around 02/12/2022) for HLD, ASTHMA, SYNCOPE -- spirometry needed.   LABORATORY TESTING:  - Pap smear: Up To Date  IMMUNIZATIONS:   - Tdap: Tetanus vaccination status reviewed: last tetanus booster within 10 years. - Influenza: Up to date - Pneumovax: Up To Date - Prevnar: Not applicable - HPV: Not applicable - Zostavax vaccine: Up To Date -- will get records from pharmacy  SCREENING: -Mammogram: Up to date  - Colonoscopy: Up To Date with Cologuard - Bone Density: Not applicable  -Hearing Test: Not applicable  -Spirometry: Not applicable   PATIENT COUNSELING:   Advised to take 1 mg of folate supplement per day if capable of pregnancy.   Sexuality: Discussed sexually transmitted diseases, partner selection, use of condoms, avoidance of unintended pregnancy  and contraceptive alternatives.   Advised to avoid cigarette smoking.  I discussed with the patient that most people either abstain  from alcohol or drink within safe limits (<=14/week and <=4 drinks/occasion for males, <=7/weeks and <= 3 drinks/occasion for females) and that the risk for alcohol disorders and other health effects rises proportionally with the number of drinks per week and how often a drinker exceeds daily limits.  Discussed cessation/primary prevention of drug use and availability of treatment for abuse.   Diet: Encouraged to adjust caloric intake to maintain  or achieve ideal body weight, to reduce intake of dietary saturated fat and total fat, to limit sodium intake by avoiding high sodium foods and not adding table salt, and to maintain adequate dietary potassium and calcium preferably from fresh fruits, vegetables, and low-fat dairy products.    Stressed the importance of regular exercise  Injury prevention: Discussed safety belts, safety helmets, smoke detector, smoking near bedding or upholstery.   Dental health: Discussed importance of regular tooth brushing, flossing, and dental visits.    NEXT PREVENTATIVE PHYSICAL DUE IN 1 YEAR. Return in about 6  months (around 02/12/2022) for HLD, ASTHMA, SYNCOPE -- spirometry needed.

## 2021-08-12 NOTE — Assessment & Plan Note (Signed)
Chronic, stable.  Followed by GI, continue this collaboration. 

## 2021-08-12 NOTE — Assessment & Plan Note (Signed)
Chronic, ongoing.  FEV1 81% and FEV1/FVC 99% on 01/04/2020.  Continue current medication regimen and adjust as needed based on symptoms.  Controlled at this time with no recent exacerbation.  Return in 6 months for follow-up with spirometry.

## 2021-08-12 NOTE — Assessment & Plan Note (Signed)
Chronic, ongoing.  Continue current medication regimen and adjust as needed.  Thyroid labs today. 

## 2021-08-12 NOTE — Assessment & Plan Note (Signed)
Previous check was 6.4%, recheck today.  Has family history of diabetes.  Recommend continued focus on diet and exercise.  Goal is to avoid medication initiation.

## 2021-08-12 NOTE — Progress Notes (Signed)
Contacted via MyChart   The A1c is the diabetes testing we talked about, this looks at your blood sugars over the past 3 months and turns the average into a number.  Your number is remaining at 6.4%, meaning you are prediabetic.  Any number 5.7 to 6.4 is considered prediabetes and any number 6.5 or greater is considered diabetes.   I would recommend heavy focus on decreasing foods high in sugar and your intake of things like bread products, pasta, and rice.  The American Diabetes Association online has a large amount of information on diet changes to make.  We will recheck this number in 6 months to ensure you are not continuing to trend upwards and move into diabetes.  Have a good day.

## 2021-08-12 NOTE — Assessment & Plan Note (Signed)
Chronic, ongoing in presence of asthma.  Continue daily Singulair and adjust regimen as needed. 

## 2021-08-13 LAB — CBC WITH DIFFERENTIAL/PLATELET
Basophils Absolute: 0 10*3/uL (ref 0.0–0.2)
Basos: 0 %
EOS (ABSOLUTE): 0.1 10*3/uL (ref 0.0–0.4)
Eos: 1 %
Hematocrit: 39.8 % (ref 34.0–46.6)
Hemoglobin: 13.2 g/dL (ref 11.1–15.9)
Immature Grans (Abs): 0 10*3/uL (ref 0.0–0.1)
Immature Granulocytes: 0 %
Lymphocytes Absolute: 2.6 10*3/uL (ref 0.7–3.1)
Lymphs: 39 %
MCH: 29.6 pg (ref 26.6–33.0)
MCHC: 33.2 g/dL (ref 31.5–35.7)
MCV: 89 fL (ref 79–97)
Monocytes Absolute: 0.5 10*3/uL (ref 0.1–0.9)
Monocytes: 7 %
Neutrophils Absolute: 3.5 10*3/uL (ref 1.4–7.0)
Neutrophils: 53 %
Platelets: 348 10*3/uL (ref 150–450)
RBC: 4.46 x10E6/uL (ref 3.77–5.28)
RDW: 13.1 % (ref 11.7–15.4)
WBC: 6.7 10*3/uL (ref 3.4–10.8)

## 2021-08-13 LAB — LIPID PANEL W/O CHOL/HDL RATIO
Cholesterol, Total: 153 mg/dL (ref 100–199)
HDL: 55 mg/dL (ref >39)
LDL Chol Calc (NIH): 78 mg/dL (ref 0–99)
Triglycerides: 108 mg/dL (ref 0–149)
VLDL Cholesterol Cal: 20 mg/dL (ref 5–40)

## 2021-08-13 LAB — COMPREHENSIVE METABOLIC PANEL
ALT: 19 IU/L (ref 0–32)
AST: 20 IU/L (ref 0–40)
Albumin/Globulin Ratio: 1.9 (ref 1.2–2.2)
Albumin: 4.6 g/dL (ref 3.9–4.9)
Alkaline Phosphatase: 46 IU/L (ref 44–121)
BUN/Creatinine Ratio: 11 — ABNORMAL LOW (ref 12–28)
BUN: 8 mg/dL (ref 8–27)
Bilirubin Total: 0.9 mg/dL (ref 0.0–1.2)
CO2: 22 mmol/L (ref 20–29)
Calcium: 9.5 mg/dL (ref 8.7–10.3)
Chloride: 103 mmol/L (ref 96–106)
Creatinine, Ser: 0.71 mg/dL (ref 0.57–1.00)
Globulin, Total: 2.4 g/dL (ref 1.5–4.5)
Glucose: 114 mg/dL — ABNORMAL HIGH (ref 70–99)
Potassium: 4.1 mmol/L (ref 3.5–5.2)
Sodium: 142 mmol/L (ref 134–144)
Total Protein: 7 g/dL (ref 6.0–8.5)
eGFR: 96 mL/min/{1.73_m2} (ref >59)

## 2021-08-13 LAB — VITAMIN D 25 HYDROXY (VIT D DEFICIENCY, FRACTURES): Vit D, 25-Hydroxy: 30.2 ng/mL (ref 30.0–100.0)

## 2021-08-13 LAB — TSH: TSH: 2.87 u[IU]/mL (ref 0.450–4.500)

## 2021-08-13 LAB — MAGNESIUM: Magnesium: 2.2 mg/dL (ref 1.6–2.3)

## 2021-08-13 LAB — T4, FREE: Free T4: 1.46 ng/dL (ref 0.82–1.77)

## 2021-08-13 NOTE — Progress Notes (Signed)
Contacted via MyChart   Good evening Denise Proctor, your labs have returned: - Kidney function, creatinine and eGFR, remains normal, as is liver function, AST and ALT.  CBC shows no anemia. - Thyroid labs remain stable, continue current Levothyroxine dosing. - Remainder of labs all stable, continue all current medications.  Any questions? Keep being excellent!!  Thank you for allowing me to participate in your care.  I appreciate you. Kindest regards, Asani [place]

## 2021-09-30 ENCOUNTER — Other Ambulatory Visit: Payer: Self-pay | Admitting: Nurse Practitioner

## 2021-09-30 DIAGNOSIS — E039 Hypothyroidism, unspecified: Secondary | ICD-10-CM

## 2021-10-01 NOTE — Telephone Encounter (Signed)
Has these rx at a mail order pharm. Requested Prescriptions  Pending Prescriptions Disp Refills  . levothyroxine (SYNTHROID) 50 MCG tablet [Pharmacy Med Name: LEVOTHYROXINE 0.05MG  ( ) TAB] 90 tablet 4    Sig: TAKE 1 TABLET(50 MCG) BY MOUTH DAILY     Endocrinology:  Hypothyroid Agents Passed - 09/30/2021  3:13 AM      Passed - TSH in normal range and within 360 days    TSH  Date Value Ref Range Status  08/12/2021 2.870 0.450 - 4.500 uIU/mL Final         Passed - Valid encounter within last 12 months    Recent Outpatient Visits          1 month ago Mild persistent asthma without complication   [facility] [place], Jolene T, NP   5 months ago Elevated hemoglobin A1c measurement   [facility], [place] T, NP   9 months ago Persistent asthma without complication, unspecified asthma severity   [facility] [place], [place] T, NP   1 year ago Hypothyroidism, unspecified type   [facility] [place], [place] T, NP   1 year ago Mild persistent asthma without complication   [facility] [place], Dorie Rank, NP      Future Appointments            In 2 weeks End, Cristal Deer, MD [facility] [place]. [facility]   In 4 months Cannady, Dorie Rank, NP [employer]           . omeprazole (PRILOSEC) 20 MG capsule [Pharmacy Med Name: OMEPRAZOLE 20MG  CAPSULES] 90 capsule 4    Sig: TAKE 1 CAPSULE(20 MG) BY MOUTH DAILY     Gastroenterology: Proton Pump Inhibitors Passed - 09/30/2021  3:13 AM      Passed - Valid encounter within last 12 months    Recent Outpatient Visits          1 month ago Mild persistent asthma without complication   [facility] [place], Jolene T, NP   5 months ago Elevated hemoglobin A1c measurement   [facility] [place], [place] T, NP   9 months ago Persistent asthma without complication, unspecified asthma severity   [facility] [place], [place] T, NP   1 year ago Hypothyroidism, unspecified type   [facility] [place], [place] T, NP   1 year ago Mild persistent asthma without complication   [facility] [place], [place], NP      Future Appointments            In 2 weeks End, Dorie Rank, MD [facility] Of [place]. [facility]   In 4 months Cannady, [employer], NP Dorie Rank, [facility]

## 2021-10-16 ENCOUNTER — Ambulatory Visit: Payer: 59 | Attending: Internal Medicine | Admitting: Internal Medicine

## 2021-10-16 ENCOUNTER — Encounter: Payer: Self-pay | Admitting: Internal Medicine

## 2021-10-16 ENCOUNTER — Other Ambulatory Visit
Admission: RE | Admit: 2021-10-16 | Discharge: 2021-10-16 | Disposition: A | Discharge status: Home / Self Care | Payer: 59 | Source: Ambulatory Visit | Attending: Internal Medicine | Admitting: Internal Medicine

## 2021-10-16 VITALS — BP 122/78 | HR 77 | Ht <= 58 in | Wt 114.0 lb

## 2021-10-16 DIAGNOSIS — R55 Syncope and collapse: Secondary | ICD-10-CM

## 2021-10-16 DIAGNOSIS — R0789 Other chest pain: Secondary | ICD-10-CM | POA: Diagnosis not present

## 2021-10-16 DIAGNOSIS — E785 Hyperlipidemia, unspecified: Secondary | ICD-10-CM | POA: Diagnosis not present

## 2021-10-16 DIAGNOSIS — I447 Left bundle-branch block, unspecified: Secondary | ICD-10-CM

## 2021-10-16 LAB — BASIC METABOLIC PANEL
Anion gap: 8 (ref 5–15)
BUN: 10 mg/dL (ref 8–23)
CO2: 28 mmol/L (ref 22–32)
Calcium: 9.2 mg/dL (ref 8.9–10.3)
Chloride: 106 mmol/L (ref 98–111)
Creatinine, Ser: 0.65 mg/dL (ref 0.44–1.00)
GFR, Estimated: >60 mL/min (ref >60)
Glucose, Bld: 113 mg/dL — ABNORMAL HIGH (ref 70–99)
Potassium: 3.8 mmol/L (ref 3.5–5.1)
Sodium: 142 mmol/L (ref 135–145)

## 2021-10-16 MED ORDER — METOPROLOL TARTRATE 100 MG PO TABS: 100.0000 mg | ORAL_TABLET | Freq: Once | ORAL | 0 refills | Status: DC

## 2021-10-16 MED ORDER — IVABRADINE HCL 5 MG PO TABS: 10.0000 mg | ORAL_TABLET | Freq: Once | ORAL | 0 refills | Status: AC

## 2021-10-16 MED ORDER — ASPIRIN 81 MG PO TBEC: 81.0000 mg | DELAYED_RELEASE_TABLET | Freq: Every day | ORAL | 3 refills | Status: AC

## 2021-10-16 NOTE — Patient Instructions (Signed)
Medication Instructions:   Your physician has recommended you make the following change in your medication:    START taking Asprin 81 MG once a day.   *If you need a refill on your cardiac medications before your next appointment, please call your pharmacy*   Lab Work:  ___XX____Please go to the [facility] after your appointment today for a BMP lab draw.   Testing/Procedures:  Your physician has requested that you have a carotid duplex. This test is an ultrasound of the carotid arteries in your neck. It looks at blood flow through these arteries that supply the brain with blood. Allow one hour for this exam. There are no restrictions or special instructions.   2.   Your physician has requested that you have an echocardiogram. Echocardiography is a painless test that uses sound waves to create images of your heart. It provides your doctor with information about the size and shape of your heart and how well your heart's chambers and valves are working. This procedure takes approximately one hour. There are no restrictions for this procedure.  3.     Your physician has requested that you have cardiac CT. Cardiac computed tomography (CT) is a painless test that uses an x-ray machine to take clear, detailed pictures of your heart.    Your cardiac CT will be scheduled at:  [facility] [address] [place], [place] [address] [phone]  On Thursday Sept. 21st at 3:30 PM  Please arrive 15 mins early for check-in and test prep.    Please follow these instructions carefully (unless otherwise directed):   On the Night Before the Test: Be sure to Drink plenty of water. Do not consume any caffeinated/decaffeinated beverages or chocolate 12 hours prior to your test.   On the Day of the Test: Drink plenty of water until 1 hour prior to the test. Do not eat any food 4 hours prior to the test. You may take your regular medications  prior to the test.  Take metoprolol (Lopressor) 100 MG two hours prior to test. Take Ivabradine (Corlanor) 10 MG two hours prior to test. FEMALES- please wear underwire-free bra if available, avoid dresses & tight clothing   After the Test: Drink plenty of water. After receiving IV contrast, you may experience a mild flushed feeling. This is normal. On occasion, you may experience a mild rash up to 24 hours after the test. This is not dangerous. If this occurs, you can take Benadryl 25 mg and increase your fluid intake. If you experience trouble breathing, this can be serious. If it is severe call 911 IMMEDIATELY. If it is mild, please call our office. If you take any of these medications: Glipizide/Metformin, Avandament, Glucavance, please do not take 48 hours after completing test unless otherwise instructed.  Please allow 2-4 weeks for scheduling of routine cardiac CTs. Some insurance companies require a pre-authorization which may delay scheduling of this test.   For non-scheduling related questions, please contact the cardiac imaging nurse navigator should you have any questions/concerns: Rockwell Alexandria, Cardiac Imaging Nurse Navigator Larey Brick, Cardiac Imaging Nurse Navigator [facility] Direct Office Dial: [phone]   For scheduling needs, including cancellations and rescheduling, please call [place], [phone].    Follow-Up: At [facility], you and your health needs are our priority.  As part of our continuing mission to provide you with exceptional heart care, we have created designated Provider Care Teams.  These Care Teams  include your primary Cardiologist (physician) and Advanced Practice Providers (APPs -  Physician Assistants and Nurse Practitioners) who all work together to provide you with the care you need, when you need it.  We recommend signing up for the patient portal called "MyChart".  Sign up information is provided on  this After Visit Summary.  MyChart is used to connect with patients for Virtual Visits (Telemedicine).  Patients are able to view lab/test results, encounter notes, upcoming appointments, etc.  Non-urgent messages can be sent to your provider as well.   To learn more about what you can do with MyChart, go to [email or web address].    Your next appointment:   6 week(s)  The format for your next appointment:   In Person  Provider:   You may see Yvonne Kendall, MD or one of the following Advanced Practice Providers on your designated Care Team:   Nicolasa Ducking, NP Eula Listen, PA-C Cadence Fransico Michael, PA-C Charlsie Quest, NP     Important Information About Sugar

## 2021-10-16 NOTE — Progress Notes (Unsigned)
New Outpatient Visit Date: 10/16/2021  Referring Provider: Marjie Skiff, NP [address] [place],  [place] [address]  Chief Complaint: Syncope  HPI:  Denise Proctor is a 61 y.o. female who is being seen today for the evaluation of syncope at the request of Ms. Cannady. She has a history of hyperlipidemia, prediabetes, asthma, and hypothyroidism. ***  Passed out, felt short of breath when came to.  Got worse the more she tried to talk.  Stood up with EMS and didn't feel well.  Brought to ED and had to wait in waiting room for a while.  Feeling better in waiting room.  Told vein in back of her neck and if exerted a certain way, may cause her to pass out.  Passed out twice before; attributed to anxiety because dog was acting crazy.  Happended about a year ago.  This episode occurred in May.  No further episodes since then.  Trying to drink more water.  Had been trimming bushes.  Bent over and woke up on ground.  Thinks out for about 30 seconds.  No warning.  No palpitations.  Some back pains before.  Some mild chest pain in past, has attribted to GERD.  Usually after eating.  No LH with push mowing.  No LE edema.  --------------------------------------------------------------------------------------------------  Cardiovascular History & Procedures: Cardiovascular Problems: Syncope  Risk Factors: Hyperlipidemia  Cath/PCI: None  CV Surgery: None  EP Procedures and Devices: None  Non-Invasive Evaluation(s): None  Recent CV Pertinent Labs: Lab Results  Component Value Date   CHOL 153 08/12/2021   CHOL 226 (H) 11/06/2014   HDL 55 08/12/2021   LDLCALC 78 08/12/2021   TRIG 108 08/12/2021   TRIG 197 (H) 11/06/2014   K 3.8 10/16/2021   MG 2.2 08/12/2021   BUN 10 10/16/2021   BUN 8 08/12/2021   CREATININE 0.65 10/16/2021    --------------------------------------------------------------------------------------------------  Past Medical History:  Diagnosis Date   Anxiety     Asthma    Depression    GERD (gastroesophageal reflux disease)    Hyperlipidemia    Osteopenia    Thyroid disease     Past Surgical History:  Procedure Laterality Date   COLONOSCOPY WITH PROPOFOL N/A 10/15/2017   Procedure: COLONOSCOPY WITH PROPOFOL;  Surgeon: Pasty Spillers, MD;  Location: [facility] ENDOSCOPY;  Service: Endoscopy;  Laterality: N/A;   ESOPHAGOGASTRODUODENOSCOPY (EGD) WITH PROPOFOL N/A 10/15/2017   Procedure: ESOPHAGOGASTRODUODENOSCOPY (EGD) WITH PROPOFOL;  Surgeon: Pasty Spillers, MD;  Location: [facility] ENDOSCOPY;  Service: Endoscopy;  Laterality: N/A;   TUBAL LIGATION      Current Meds  Medication Sig   albuterol (VENTOLIN HFA) 108 (90 Base) MCG/ACT inhaler INHALE 2 PUFFS BY MOUTH EVERY 6 HOURS AS NEEDED FOR WHEEZING OR SHORTNESS OF BREATH   aspirin EC 81 MG tablet Take 1 tablet (81 mg total) by mouth daily. Swallow whole.   budesonide-formoterol (SYMBICORT) 160-4.5 MCG/ACT inhaler INHALE 2 PUFFS BY MOUTH TWICE DAILY   [EXPIRED] ivabradine (CORLANOR) 5 MG TABS tablet Take 2 tablets (10 mg total) by mouth once for 1 dose. Take 2 hours prior to your CT scan.   levothyroxine (SYNTHROID) 50 MCG tablet TAKE 1 TABLET(50 MCG) BY MOUTH DAILY   montelukast (SINGULAIR) 10 MG tablet Take 1 tablet (10 mg total) by mouth at bedtime.   omeprazole (PRILOSEC) 20 MG capsule Take 1 capsule (20 mg total) by mouth daily.   rosuvastatin (CRESTOR) 40 MG tablet Take 1 tablet (40 mg total) by mouth daily.   [  DISCONTINUED] metoprolol tartrate (LOPRESSOR) 100 MG tablet Take 1 tablet (100 mg total) by mouth once for 1 dose. Take 2 hours prior to your CT scan.    Allergies: Patient has no known allergies.  Social History   Tobacco Use   Smoking status: Never   Smokeless tobacco: Never  Vaping Use   Vaping Use: Never used  Substance Use Topics   Alcohol use: No    Alcohol/week: 0.0 standard drinks of alcohol   Drug use: No    Family History  Problem Relation Age of Onset    Hypertension Mother    Hyperlipidemia Mother    COPD Mother    Heart attack Father 54   Hyperlipidemia Brother    Diverticulitis Brother    Diverticulitis Brother    Diabetes Maternal Grandmother    Heart disease Maternal Grandfather    Cancer Paternal Grandmother        stomach   Stroke Paternal Grandfather    Heart disease Paternal Grandfather     Review of Systems: A 12-system review of systems was performed and was negative except as noted in the HPI.  --------------------------------------------------------------------------------------------------  Physical Exam: BP 122/78 (BP Location: Right Arm, Patient Position: Sitting, Cuff Size: Normal)   Pulse 77   Ht 4\' 9"  (1.448 m)   Wt 114 lb (51.7 kg)   LMP 01/18/2015 (Approximate)   SpO2 98%   BMI 24.67 kg/m   General: NAD. HEENT: No conjunctival pallor or scleral icterus. Facemask in place. Neck: Supple without lymphadenopathy, thyromegaly, JVD, or HJR. No carotid bruit. Lungs: Normal work of breathing. Clear to auscultation bilaterally without wheezes or crackles. Heart: Regular rate and rhythm without murmurs, rubs, or gallops. Non-displaced PMI. Abd: Bowel sounds present. Soft, NT/ND without hepatosplenomegaly Ext: No lower extremity edema. Radial, PT, and DP pulses are 2+ bilaterally Skin: Scab noted overlying the right ankle with mild adjacent erythema. Neuro: trength and fine-touch sensation intact in upper and lower extremities bilaterally. Psych: Normal mood and affect.  EKG: Normal sinus rhythm with left bundle branch block.  Compared to prior tracing from 06/22/2021, left bundle branch block is new.  Lab Results  Component Value Date   WBC 6.7 08/12/2021   HGB 13.2 08/12/2021   HCT 39.8 08/12/2021   MCV 89 08/12/2021   PLT 348 08/12/2021    Lab Results  Component Value Date   NA 142 10/16/2021   K 3.8 10/16/2021   CL 106 10/16/2021   CO2 28 10/16/2021   BUN 10 10/16/2021   CREATININE 0.65  10/16/2021   GLUCOSE 113 (H) 10/16/2021   ALT 19 08/12/2021    Lab Results  Component Value Date   CHOL 153 08/12/2021   HDL 55 08/12/2021   LDLCALC 78 08/12/2021   TRIG 108 08/12/2021     --------------------------------------------------------------------------------------------------  ASSESSMENT AND PLAN: 10/13/2021 Fathima Bartl, MD 10/17/2021 7:54 AM

## 2021-10-17 ENCOUNTER — Other Ambulatory Visit: Payer: Self-pay | Admitting: Internal Medicine

## 2021-10-18 ENCOUNTER — Encounter: Payer: Self-pay | Admitting: Internal Medicine

## 2021-10-18 DIAGNOSIS — R0789 Other chest pain: Secondary | ICD-10-CM | POA: Insufficient documentation

## 2021-10-18 DIAGNOSIS — I447 Left bundle-branch block, unspecified: Secondary | ICD-10-CM | POA: Insufficient documentation

## 2021-10-23 ENCOUNTER — Telehealth (HOSPITAL_COMMUNITY): Payer: Self-pay | Admitting: Emergency Medicine

## 2021-10-23 NOTE — Telephone Encounter (Signed)
Attempted to call patient regarding upcoming cardiac CT appointment. °Left message on voicemail with name and callback number °Nat Lowenthal RN Navigator Cardiac Imaging °[facility] °[phone] Office °[phone] Cell ° °

## 2021-10-23 NOTE — Telephone Encounter (Signed)
Reaching out to patient to offer assistance regarding upcoming cardiac imaging study; pt verbalizes understanding of appt date/time, parking situation and where to check in, pre-test NPO status and medications ordered, and verified current allergies; name and call back number provided for further questions should they arise Marchia Bond RN Navigator Cardiac Imaging [facility] [phone] office [phone] cell  Arrival 315 Denies iv issues 100mg  metoprolol + 10mg  ivabradine

## 2021-10-24 ENCOUNTER — Ambulatory Visit
Admission: RE | Admit: 2021-10-24 | Discharge: 2021-10-24 | Disposition: A | Discharge status: Home / Self Care | Payer: 59 | Source: Ambulatory Visit | Attending: Internal Medicine | Admitting: Internal Medicine

## 2021-10-24 DIAGNOSIS — I447 Left bundle-branch block, unspecified: Secondary | ICD-10-CM | POA: Insufficient documentation

## 2021-10-24 DIAGNOSIS — R55 Syncope and collapse: Secondary | ICD-10-CM | POA: Insufficient documentation

## 2021-10-24 MED ORDER — METOPROLOL TARTRATE 5 MG/5ML IV SOLN
10.0000 mg | Freq: Once | INTRAVENOUS | Status: AC
  Administered 2021-10-24: 10 mg via INTRAVENOUS

## 2021-10-24 MED ORDER — METOPROLOL TARTRATE 5 MG/5ML IV SOLN
10.0000 mg | Freq: Once | INTRAVENOUS | Status: AC
Start: 2021-10-24 — End: 2021-10-24
  Administered 2021-10-24: 10 mg via INTRAVENOUS

## 2021-10-24 MED ORDER — IOHEXOL 350 MG/ML SOLN
75.0000 mL | Freq: Once | INTRAVENOUS | Status: AC | PRN
  Administered 2021-10-24: 75 mL via INTRAVENOUS

## 2021-10-24 MED ORDER — NITROGLYCERIN 0.4 MG SL SUBL
0.8000 mg | SUBLINGUAL_TABLET | Freq: Once | SUBLINGUAL | Status: AC
Start: 2021-10-24 — End: 2021-10-24
  Administered 2021-10-24: 0.8 mg via SUBLINGUAL

## 2021-10-24 NOTE — Progress Notes (Signed)
Patient tolerated procedure well. Ambulate w/o difficulty. Denies any lightheadedness or being dizzy. Pt denies any pain at this time. Sitting in chair, drinking water provided. P is encouraged to drink additional water throughout the day and reason explained to patient. Patient verbalized understanding and all questions answered. ABC intact. No further needs at this time. Discharge from procedure area w/o issues.  °

## 2021-10-31 ENCOUNTER — Telehealth: Payer: Self-pay | Admitting: Internal Medicine

## 2021-10-31 NOTE — Telephone Encounter (Signed)
Denise Bush, MD  10/29/2021  5:07 PM EDT     Please let Ms. Sobecki know that her coronary CTA does not show any significant narrowing or blockage in her heart arteries.  There is a small nodule in her right lung.  I recommend that we obtain a CT of the chest without contrast in 6 months to ensure stability.

## 2021-10-31 NOTE — Telephone Encounter (Signed)
Lamar Laundry, RN  10/31/2021  2:04 PM EDT Back to Top    2nd attempt to contact the pt with results. Lmtcb.   Antonieta Iba, RN  10/30/2021  8:28 AM EDT     Left message for patient to call back.

## 2021-11-01 NOTE — Telephone Encounter (Signed)
Several attempts have been made to reach the pt by telephone with results. Letter mailed to the patient to contact our office for results.

## 2021-11-19 ENCOUNTER — Ambulatory Visit: Payer: 59 | Attending: Internal Medicine

## 2021-11-19 ENCOUNTER — Ambulatory Visit (INDEPENDENT_AMBULATORY_CARE_PROVIDER_SITE_OTHER): Payer: 59

## 2021-11-19 DIAGNOSIS — R55 Syncope and collapse: Secondary | ICD-10-CM

## 2021-11-19 DIAGNOSIS — I447 Left bundle-branch block, unspecified: Secondary | ICD-10-CM | POA: Diagnosis not present

## 2021-11-19 LAB — ECHOCARDIOGRAM COMPLETE
AR max vel: 1.46 cm2
AV Area VTI: 1.39 cm2
AV Area mean vel: 1.21 cm2
AV Mean grad: 4 mmHg
AV Peak grad: 8 mmHg
Ao pk vel: 1.41 m/s
Area-P 1/2: 4.29 cm2
Calc EF: 69.7 %
S' Lateral: 2.1 cm
Single Plane A2C EF: 69.1 %
Single Plane A4C EF: 67.8 %

## 2021-11-21 ENCOUNTER — Telehealth: Payer: Self-pay | Admitting: Internal Medicine

## 2021-11-21 NOTE — Telephone Encounter (Signed)
I spoke with the patient regarding her echo and carotid ultrasound results. She voices understanding of these results and Dr. Darnelle Bos recommendations to wear a ZIO AT monitor x 3 weeks.  Per the patient, she would prefer to keep her follow up appointment on 11/27/21 with Barbera Setters, NP and discuss the monitor further at that time.   I advised the patient that is fine- she was very appreciative of the call.

## 2021-11-21 NOTE — Telephone Encounter (Signed)
Regarding Echo & Carotid US:  Nelva Bush, MD  11/21/2021  7:54 AM EDT     Please let Ms. Norgard know that her echo shows that her heart is contracting well with normal valve function.  Preliminary review of her carotid Doppler is also normal (we will notify her if any changes are made on the final report, when available).  I do not see any cause for her syncopal episodes on these tests and recommend that we obtain a 14 day event monitor (Zio AT) and follow-up after completion of the monitor to review the results and reevaluate her symptoms.

## 2021-11-27 ENCOUNTER — Ambulatory Visit (INDEPENDENT_AMBULATORY_CARE_PROVIDER_SITE_OTHER): Payer: 59

## 2021-11-27 ENCOUNTER — Encounter: Payer: Self-pay | Admitting: Cardiology

## 2021-11-27 ENCOUNTER — Ambulatory Visit: Payer: 59 | Attending: Cardiology | Admitting: Cardiology

## 2021-11-27 VITALS — BP 110/60 | HR 79 | Ht <= 58 in | Wt 115.2 lb

## 2021-11-27 DIAGNOSIS — R55 Syncope and collapse: Secondary | ICD-10-CM

## 2021-11-27 DIAGNOSIS — E785 Hyperlipidemia, unspecified: Secondary | ICD-10-CM | POA: Diagnosis not present

## 2021-11-27 NOTE — Patient Instructions (Signed)
Medication Instructions:  No changes at this time.   *If you need a refill on your cardiac medications before your next appointment, please call your pharmacy*   Lab Work: None  If you have labs (blood work) drawn today and your tests are completely normal, you will receive your results only by: [place] (if you have MyChart) OR A paper copy in the mail If you have any lab test that is abnormal or we need to change your treatment, we will call you to review the results.   Testing/Procedures: Your physician has recommended that you wear a Zio AT Live monitor.   This monitor is a medical device that records the heart's electrical activity. Doctors most often use these monitors to diagnose arrhythmias. Arrhythmias are problems with the speed or rhythm of the heartbeat. The monitor is a small device applied to your chest. You can wear one while you do your normal daily activities. While wearing this monitor if you have any symptoms to push the button and record what you felt. Once you have worn this monitor for the period of time provider prescribed (Usually 14 days), you will return the monitor device in the postage paid box/bag. Once it is returned they will download the data collected and provide [place] with a report which the provider will then review and we will call you with those results.   Important tips:  Avoid showering during the first 24 hours of wearing the monitor. Avoid excessive sweating to help maximize wear time. Do not submerge the device, no hot tubs, and no swimming pools. Keep any lotions or oils away from the patch. After 24 hours you may shower with the patch on. Take brief showers with your back facing the shower head.  Do not remove patch once it has been placed because that will interrupt data and decrease adhesive wear time. Push the button when you have any symptoms and write down what you were feeling. Once you have completed wearing your monitor, remove and  place into box which has postage paid and place in your outgoing mailbox.  If for some reason you have misplaced your box then call our office and we can provide another box and/or mail it off for you. Keep the transmitter within 10 feet at all times.  Expect a welcome phone call within 67-20 hrs of application from [place].  This call will include your copay information, so please answer any unknown phone calls while wearing Zio (it could also be important information about your heart) The envelope to return Zio is in the back of the transmitter. Removal instructions are on the last page of the symptom diary.  Place the patch sticky side up inside the transmitter and the symptom diary inside the envelope to return on your last wear day inside your mailbox or any [employer]PS mailbox.    Follow-Up: At [facility], you and your health needs are our priority.  As part of our continuing mission to provide you with exceptional heart care, we have created designated Provider Care Teams.  These Care Teams include your primary Cardiologist (physician) and Advanced Practice Providers (APPs -  Physician Assistants and Nurse Practitioners) who all work together to provide you with the care you need, when you need it.  Your next appointment:   6 week(s)  The format for your next appointment:   In Person  Provider:   Nelva Bush, MD or Gerrie Nordmann, NP        Important  Information About [place]

## 2021-11-27 NOTE — Progress Notes (Signed)
Cardiology Clinic Note   Patient Name: Denise Proctor Date of Encounter: 11/27/2021  Primary Care Provider:  Marjie Skiff, NP Primary Cardiologist:  None  Patient Profile    61 year old female with history of hyperlipidemia, prediabetes, asthma, who is here for follow-up on recent syncopal episodes.   Past Medical History    Past Medical History:  Diagnosis Date   Anxiety    Asthma    Depression    GERD (gastroesophageal reflux disease)    Hyperlipidemia    Osteopenia    Thyroid disease    Past Surgical History:  Procedure Laterality Date   COLONOSCOPY WITH PROPOFOL N/A 10/15/2017   Procedure: COLONOSCOPY WITH PROPOFOL;  Surgeon: Pasty Spillers, MD;  Location: [facility];  Service: Endoscopy;  Laterality: N/A;   ESOPHAGOGASTRODUODENOSCOPY (EGD) WITH PROPOFOL N/A 10/15/2017   Procedure: ESOPHAGOGASTRODUODENOSCOPY (EGD) WITH PROPOFOL;  Surgeon: Pasty Spillers, MD;  Location: [facility]OSCOPY;  Service: Endoscopy;  Laterality: N/A;   TUBAL LIGATION      Allergies  No Known Allergies  History of Present Illness    [name] is a 61 year old female with a past medical history of hyperlipidemia,  prediabetes, asthma, and  hypothyroidism.  She does not have any heart history or history of prior cardiac testing.  She was last seen by Dr. Okey Dupre on 10/16/2021 for the evaluation of recent syncopal episodes. She described the episodes as starting in May. She states that she is always outside working when these episodes happen. He first episode happened when she was outside trimming bushes. She found herself lying on the ground.  She does not recall any prodromal symptoms.  She states she was likely unconscious for approximately 30 seconds.  She did not injure herself and felt that she did not hit her head.  Her symptoms of breathlessness had resolved while she had waited in the emergency department waiting room.  Evaluation in the emergency department unremarkable.   She did state that her primary care provider thought that there may be a vascular issue in her neck caused her syncopal episodes.  Coronary CTA was completed on 10/25/21 which revealed a calcium score of 0. Normal coronary origin with right dominance, no evidence of CAD.   Carotid duplex was completed on 11/19/2021 with normal results.   Echocardiogram revealed LVEF, no regional wall motion abnormalities, G2DD, and  trivial mitral regurgitation.  She returns to clinic today to follow-up on her recent outpatient testing.  She stated that she did have another one of her episodes approximately 2 weeks prior when she was carrying a box to the building outside.  She stated the box was slightly heavy and she had exerted herself when caring it down the yard.  She had returned and was coming back out the yard when she woke up face down in the grass.  She states she thinks that she was only out for a few seconds.  She denies any loss of bowel or bladder function with these episodes.  When she got up she was slightly lightheaded and some confusion, her son help her get to the car and she sat down for approximately 30 minutes to an hour before symptoms dissipated.  She denies any chest pain, shortness of breath, dyspnea on exertion, or the feeling she previously had of breathlessness.     Home Medications    Current Outpatient Medications  Medication Sig Dispense Refill   albuterol (VENTOLIN HFA) 108 (90 Base) MCG/ACT inhaler INHALE 2 PUFFS  BY MOUTH EVERY 6 HOURS AS NEEDED FOR WHEEZING OR SHORTNESS OF BREATH 25.5 g 4   aspirin EC 81 MG tablet Take 1 tablet (81 mg total) by mouth daily. Swallow whole. 90 tablet 3   budesonide-formoterol (SYMBICORT) 160-4.5 MCG/ACT inhaler INHALE 2 PUFFS BY MOUTH TWICE DAILY 30.6 g 4   levothyroxine (SYNTHROID) 50 MCG tablet TAKE 1 TABLET(50 MCG) BY MOUTH DAILY 90 tablet 4   montelukast (SINGULAIR) 10 MG tablet Take 1 tablet (10 mg total) by mouth at bedtime. 90 tablet 4    omeprazole (PRILOSEC) 20 MG capsule Take 1 capsule (20 mg total) by mouth daily. 90 capsule 4   rosuvastatin (CRESTOR) 40 MG tablet Take 1 tablet (40 mg total) by mouth daily. 90 tablet 4   No current facility-administered medications for this visit.     Family History    Family History  Problem Relation Age of Onset   Hypertension Mother    Hyperlipidemia Mother    COPD Mother    Heart attack Father 31   Hyperlipidemia Brother    Diverticulitis Brother    Diverticulitis Brother    Diabetes Maternal Grandmother    Heart disease Maternal Grandfather    Cancer Paternal Grandmother        stomach   Stroke Paternal Grandfather    Heart disease Paternal Grandfather    She indicated that her mother is alive. She indicated that her father is deceased. She indicated that both of her brothers are alive. She indicated that her maternal grandmother is deceased. She indicated that her maternal grandfather is deceased. She indicated that her paternal grandmother is deceased. She indicated that her paternal grandfather is deceased. She indicated that her daughter is alive. She indicated that her son is alive.  Social History    Social History   Socioeconomic History   Marital status: Married    Spouse name: Not on file   Number of children: Not on file   Years of education: Not on file   Highest education level: Not on file  Occupational History   Not on file  Tobacco Use   Smoking status: Never   Smokeless tobacco: Never  Vaping Use   Vaping Use: Never used  Substance and Sexual Activity   Alcohol use: No    Alcohol/week: 0.0 standard drinks of alcohol   Drug use: No   Sexual activity: Yes  Other Topics Concern   Not on file  Social History Narrative   Not on file   Social Determinants of Health   Financial Resource Strain: Low Risk  (07/04/2020)   Overall Financial Resource Strain (CARDIA)    Difficulty of Paying Living Expenses: Not hard at all  Food Insecurity: No Food  Insecurity (07/04/2020)   Hunger Vital Sign    Worried About Running Out of Food in the Last Year: Never true    Ran Out of Food in the Last Year: Never true  Transportation Needs: No Transportation Needs (07/04/2020)   PRAPARE - Administrator, Civil Service (Medical): No    Lack of Transportation (Non-Medical): No  Physical Activity: Inactive (07/04/2020)   Exercise Vital Sign    Days of Exercise per Week: 0 days    Minutes of Exercise per Session: 0 min  Stress: No Stress Concern Present (07/04/2020)   [employer] of Occupational Health - Occupational Stress Questionnaire    Feeling of Stress : Only a little  Social Connections: Moderately Integrated (07/04/2020)   Social Connection and  Isolation Panel [NHANES]    Frequency of Communication with Friends and Family: More than three times a week    Frequency of Social Gatherings with Friends and Family: More than three times a week    Attends Religious Services: More than 4 times per year    Active Member of [employer] or Organizations: Yes    Attends Archivist Meetings: Never    Marital Status: Divorced  Human resources officer Violence: Not At Risk (07/04/2020)   Humiliation, Afraid, Rape, and Kick questionnaire    Fear of Current or Ex-Partner: No    Emotionally Abused: No    Physically Abused: No    Sexually Abused: No     Review of Systems    General:  No chills, fever, night sweats or weight changes.  Cardiovascular:  No chest pain, dyspnea on exertion, edema, orthopnea, palpitations, paroxysmal nocturnal dyspnea. Dermatological: No rash, lesions/masses Respiratory: No cough, dyspnea Urologic: No hematuria, dysuria Abdominal:   No nausea, vomiting, diarrhea, bright red blood per rectum, melena, or hematemesis Neurologic:  No visual changes, wkns, changes in mental status.  Endorses recurrent syncopal event All other systems reviewed and are otherwise negative except as noted above.   Physical Exam    VS:  BP  110/60 (BP Location: Left Arm, Patient Position: Sitting, Cuff Size: Normal)   Pulse 79   Ht 4\' 9"  (1.448 m)   Wt 115 lb 4 oz (52.3 kg)   LMP 01/18/2015 (Approximate)   SpO2 98%   BMI 24.94 kg/m  , BMI Body mass index is 24.94 kg/m.     GEN: Well nourished, well developed, in no acute distress. HEENT: normal. Neck: Supple, no JVD, carotid bruits, or masses. Cardiac: RRR, no murmurs, rubs, or gallops. No clubbing, cyanosis, edema.  Radials/DP/PT 2+ and equal bilaterally.  Respiratory:  Respirations regular and unlabored, clear to auscultation bilaterally. GI: Soft, nontender, nondistended, BS + x 4. MS: no deformity or atrophy. Skin: warm and dry, no rash. Neuro:  Strength and sensation are intact. Psych: Normal affect.  Accessory Clinical Findings    ECG personally reviewed by me today- No new tracing was completed today  Lab Results  Component Value Date   WBC 6.7 08/12/2021   HGB 13.2 08/12/2021   HCT 39.8 08/12/2021   MCV 89 08/12/2021   PLT 348 08/12/2021   Lab Results  Component Value Date   CREATININE 0.65 10/16/2021   BUN 10 10/16/2021   NA 142 10/16/2021   K 3.8 10/16/2021   CL 106 10/16/2021   CO2 28 10/16/2021   Lab Results  Component Value Date   ALT 19 08/12/2021   AST 20 08/12/2021   ALKPHOS 46 08/12/2021   BILITOT 0.9 08/12/2021   Lab Results  Component Value Date   CHOL 153 08/12/2021   HDL 55 08/12/2021   LDLCALC 78 08/12/2021   TRIG 108 08/12/2021    Lab Results  Component Value Date   HGBA1C 6.4 (H) 08/12/2021    Assessment & Plan   1.  Syncope with the most recent event approximately 2 weeks prior.  Symptoms most recently started in 06/2021 without prodromal symptoms.  Coronary CTA completed with a calcium score of 0.  Carotid duplex was a normal study.  Echocardiogram was unrevealing without structural abnormalities.  With her continued episodes of syncope today we will place her on a ZIO AT monitor for 14 days to rule out any  electrical etiology and arrhythmia.  She has been advised of not  driving.  She states that she does continue to drive because her episodes are only happening when she exerts herself.  She has been continued on aspirin therapy.  2.  Hyperlipidemia with lipids well controlled on last check in May.  Continue with rosuvastatin.  Continues to be monitored by PCP  3.  Disposition patient to return to clinic to see MD/APP in 4 to 6 weeks after monitor is returned and read or sooner if needed  Sabri Teal, NP 11/27/2021, 7:19 PM

## 2021-11-30 DIAGNOSIS — R55 Syncope and collapse: Secondary | ICD-10-CM

## 2021-12-31 NOTE — Progress Notes (Signed)
Sinus rhythm with rare early beats noted. No significant arrhythmia that would have cause syncope.

## 2022-01-10 ENCOUNTER — Encounter: Payer: Self-pay | Admitting: Cardiology

## 2022-01-10 ENCOUNTER — Ambulatory Visit: Payer: 59 | Attending: Cardiology | Admitting: Cardiology

## 2022-01-10 VITALS — BP 108/72 | HR 76 | Ht <= 58 in | Wt 115.2 lb

## 2022-01-10 DIAGNOSIS — E785 Hyperlipidemia, unspecified: Secondary | ICD-10-CM

## 2022-01-10 DIAGNOSIS — R55 Syncope and collapse: Secondary | ICD-10-CM

## 2022-01-10 NOTE — Progress Notes (Signed)
Cardiology Clinic Note   Patient Name: Denise Proctor Date of Encounter: 01/10/2022  Primary Care Provider:  Marjie Skiff, NP Primary Cardiologist:  None  Patient Profile    61 year old female with history of hyperlipidemia, prediabetes, and recent syncopal episode, who is here today for follow-up.  Past Medical History    Past Medical History:  Diagnosis Date   Anxiety    Asthma    Depression    GERD (gastroesophageal reflux disease)    Hyperlipidemia    Osteopenia    Thyroid disease    Past Surgical History:  Procedure Laterality Date   COLONOSCOPY WITH PROPOFOL N/A 10/15/2017   Procedure: COLONOSCOPY WITH PROPOFOL;  Surgeon: Pasty Spillers, MD;  Location: [facility];  Service: Endoscopy;  Laterality: N/A;   ESOPHAGOGASTRODUODENOSCOPY (EGD) WITH PROPOFOL N/A 10/15/2017   Procedure: ESOPHAGOGASTRODUODENOSCOPY (EGD) WITH PROPOFOL;  Surgeon: Pasty Spillers, MD;  Location: [facility];  Service: Endoscopy;  Laterality: N/A;   TUBAL LIGATION      Allergies  No Known Allergies  History of Present Illness    Denise Proctor 61 year old female with a past medical history including hyperlipidemia, prediabetes, asthma and hypothyroidism.  No prior cardiac history.  She previously been evaluated in the office on 10/16/2021 by Dr. Okey Dupre for recent syncopal episodes.  They have been starting since May of this year.  She states that she was likely unconscious for approximately 30 seconds's.  Had evaluation in the emergency department that was unremarkable and stated that her primary care provider thought there may be a vascular issue in her neck that was causing the episodes.  Coronary CTA was completed 10/25/2021 which revealed a calcium score of 0, normal coronary origin with right dominance, no evidence of coronary artery disease.  Carotid duplex was completed on 11/19/2021 with normal results.  Echocardiogram revealed normal LVEF, no regional wall motion  abnormalities, G2 DD, and trivial mitral regurgitation.  She was last seen in clinic 11/27/2021 to follow-up on previous outpatient testing as she had had.  She states she did have another one of her episode approximately 2 weeks prior to that visit where she had lost consciousness in the yard she says she sat down for approximately 30 minutes to an hour before symptoms dissipated.  So she was placed on a ZIO AT monitor for 14 days to rule out electrical etiology or arrhythmia.  Unfortunately her AT monitor revealed predominantly sinus rhythm without any cause for syncope.  She returns to clinic today stating that she has been doing well.  She has had no further syncopal episodes.  She denies any current chest pain, shortness of breath, dyspnea on exertion, or palpitations.  She also denies any hospitalizations or visits to the emergency department.  Home Medications    Current Outpatient Medications  Medication Sig Dispense Refill   albuterol (VENTOLIN HFA) 108 (90 Base) MCG/ACT inhaler INHALE 2 PUFFS BY MOUTH EVERY 6 HOURS AS NEEDED FOR WHEEZING OR SHORTNESS OF BREATH 25.5 g 4   aspirin EC 81 MG tablet Take 1 tablet (81 mg total) by mouth daily. Swallow whole. 90 tablet 3   budesonide-formoterol (SYMBICORT) 160-4.5 MCG/ACT inhaler INHALE 2 PUFFS BY MOUTH TWICE DAILY 30.6 g 4   levothyroxine (SYNTHROID) 50 MCG tablet TAKE 1 TABLET(50 MCG) BY MOUTH DAILY 90 tablet 4   montelukast (SINGULAIR) 10 MG tablet Take 1 tablet (10 mg total) by mouth at bedtime. 90 tablet 4   omeprazole (PRILOSEC) 20 MG capsule Take  1 capsule (20 mg total) by mouth daily. 90 capsule 4   rosuvastatin (CRESTOR) 40 MG tablet Take 1 tablet (40 mg total) by mouth daily. 90 tablet 4   No current facility-administered medications for this visit.     Family History    Family History  Problem Relation Age of Onset   Hypertension Mother    Hyperlipidemia Mother    COPD Mother    Heart attack Father 16   Hyperlipidemia  Brother    Diverticulitis Brother    Diverticulitis Brother    Diabetes Maternal Grandmother    Heart disease Maternal Grandfather    Cancer Paternal Grandmother        stomach   Stroke Paternal Grandfather    Heart disease Paternal Grandfather    She indicated that her mother is alive. She indicated that her father is deceased. She indicated that both of her brothers are alive. She indicated that her maternal grandmother is deceased. She indicated that her maternal grandfather is deceased. She indicated that her paternal grandmother is deceased. She indicated that her paternal grandfather is deceased. She indicated that her daughter is alive. She indicated that her son is alive.  Social History    Social History   Socioeconomic History   Marital status: Married    Spouse name: Not on file   Number of children: Not on file   Years of education: Not on file   Highest education level: Not on file  Occupational History   Not on file  Tobacco Use   Smoking status: Never   Smokeless tobacco: Never  Vaping Use   Vaping Use: Never used  Substance and Sexual Activity   Alcohol use: No    Alcohol/week: 0.0 standard drinks of alcohol   Drug use: No   Sexual activity: Yes  Other Topics Concern   Not on file  Social History Narrative   Not on file   Social Determinants of Health   Financial Resource Strain: Low Risk  (07/04/2020)   Overall Financial Resource Strain (CARDIA)    Difficulty of Paying Living Expenses: Not hard at all  Food Insecurity: No Food Insecurity (07/04/2020)   Hunger Vital Sign    Worried About Running Out of Food in the Last Year: Never true    Ran Out of Food in the Last Year: Never true  Transportation Needs: No Transportation Needs (07/04/2020)   PRAPARE - Administrator, Civil Service (Medical): No    Lack of Transportation (Non-Medical): No  Physical Activity: Inactive (07/04/2020)   Exercise Vital Sign    Days of Exercise per Week: 0 days     Minutes of Exercise per Session: 0 min  Stress: No Stress Concern Present (07/04/2020)   [employer] of Occupational Health - Occupational Stress Questionnaire    Feeling of Stress : Only a little  Social Connections: Moderately Integrated (07/04/2020)   Social Connection and Isolation Panel [NHANES]    Frequency of Communication with Friends and Family: More than three times a week    Frequency of Social Gatherings with Friends and Family: More than three times a week    Attends Religious Services: More than 4 times per year    Active Member of [employer] or Organizations: Yes    Attends Banker Meetings: Never    Marital Status: Divorced  Catering manager Violence: Not At Risk (07/04/2020)   Humiliation, Afraid, Rape, and Kick questionnaire    Fear of Current or Ex-Partner: No  Emotionally Abused: No    Physically Abused: No    Sexually Abused: No     Review of Systems    General:  No chills, fever, night sweats or weight changes.  Cardiovascular:  No chest pain, dyspnea on exertion, edema, orthopnea, palpitations, paroxysmal nocturnal dyspnea. Dermatological: No rash, lesions/masses Respiratory: No cough, dyspnea Urologic: No hematuria, dysuria Abdominal:   No nausea, vomiting, diarrhea, bright red blood per rectum, melena, or hematemesis Neurologic:  No visual changes, wkns, changes in mental status. All other systems reviewed and are otherwise negative except as noted above.     Physical Exam    VS:  BP 108/72 (BP Location: Left Arm, Patient Position: Sitting, Cuff Size: Normal)   Pulse 76   Ht 4\' 9"  (1.448 m)   Wt 115 lb 3.2 oz (52.3 kg)   LMP 01/18/2015 (Approximate)   SpO2 99%   BMI 24.93 kg/m  , BMI Body mass index is 24.93 kg/m.     GEN: Well nourished, well developed, in no acute distress. HEENT: normal. Neck: Supple, no JVD, carotid bruits, or masses. Cardiac: RRR, no murmurs, rubs, or gallops. No clubbing, cyanosis, edema.  Radials 2+/PT 2+ and  equal bilaterally.  Respiratory:  Respirations regular and unlabored, clear to auscultation bilaterally. GI: Soft, nontender, nondistended, BS + x 4. MS: no deformity or atrophy. Skin: warm and dry, no rash. Neuro:  Strength and sensation are intact. Psych: Normal affect.  Accessory Clinical Findings    ECG personally reviewed by me today-no new tracings  Lab Results  Component Value Date   WBC 6.7 08/12/2021   HGB 13.2 08/12/2021   HCT 39.8 08/12/2021   MCV 89 08/12/2021   PLT 348 08/12/2021   Lab Results  Component Value Date   CREATININE 0.65 10/16/2021   BUN 10 10/16/2021   NA 142 10/16/2021   K 3.8 10/16/2021   CL 106 10/16/2021   CO2 28 10/16/2021   Lab Results  Component Value Date   ALT 19 08/12/2021   AST 20 08/12/2021   ALKPHOS 46 08/12/2021   BILITOT 0.9 08/12/2021   Lab Results  Component Value Date   CHOL 153 08/12/2021   HDL 55 08/12/2021   LDLCALC 78 08/12/2021   TRIG 108 08/12/2021    Lab Results  Component Value Date   HGBA1C 6.4 (H) 08/12/2021    Assessment & Plan   1.  History of syncopal episodes.  No further episodes since her last visit.  She did wear a ZIO AT monitor which showed no episodes for concern recalls of her syncopal episode.  All other workup coronary CTA, echocardiogram, carotid duplex were all unrevealing.  She has been advised against not driving since finding calls her syncopal episodes have not been found as per 10/13/2021.  She is continued on an aspirin.  There are some of her own research at home she thinks that some of her issue was related to possibly a drop in her sugar or dehydration.  She has been encouraged to remain hydrated and to inspect for she sometimes snacks throughout the day if she is not going to eat a meal.  2.  Hyperlipidemia lipids well-controlled on last check in May.  She is continued on rosuvastatin 40 mg daily.  This continues to be monitored by her PCP.  3.  Disposition patient return  to clinic to see MD/APP in 3 months or sooner if needed.  She has been advised if she has any further  syncopal episodes to notify the office at that time.  As with all testing being unrevealing  consider possible referral to EP with LBBB also noted on EKG.  Kimberly Coye, NP 01/10/2022, 4:26 PM

## 2022-01-10 NOTE — Patient Instructions (Signed)
Medication Instructions:  Your Physician recommend you continue on your current medication as directed.    *If you need a refill on your cardiac medications before your next appointment, please call your pharmacy*   Lab Work: None ordered today   Testing/Procedures: None ordered today   Follow-Up: At [facility], you and your health needs are our priority.  As part of our continuing mission to provide you with exceptional heart care, we have created designated Provider Care Teams.  These Care Teams include your primary Cardiologist (physician) and Advanced Practice Providers (APPs -  Physician Assistants and Nurse Practitioners) who all work together to provide you with the care you need, when you need it.  We recommend signing up for the patient portal called "MyChart".  Sign up information is provided on this After Visit Summary.  MyChart is used to connect with patients for Virtual Visits (Telemedicine).  Patients are able to view lab/test results, encounter notes, upcoming appointments, etc.  Non-urgent messages can be sent to your provider as well.   To learn more about what you can do with MyChart, go to [email or web address].    Your next appointment:   3 month(s)  The format for your next appointment:   In Person  Provider:   Christopher End, MD          

## 2022-02-10 NOTE — Patient Instructions (Signed)

## 2022-02-12 ENCOUNTER — Encounter: Payer: Self-pay | Admitting: Nurse Practitioner

## 2022-02-12 ENCOUNTER — Ambulatory Visit (INDEPENDENT_AMBULATORY_CARE_PROVIDER_SITE_OTHER): Payer: 59 | Admitting: Nurse Practitioner

## 2022-02-12 VITALS — BP 118/70 | HR 84 | Temp 98.0°F | Resp 18 | Ht <= 58 in | Wt 116.3 lb

## 2022-02-12 DIAGNOSIS — E039 Hypothyroidism, unspecified: Secondary | ICD-10-CM

## 2022-02-12 DIAGNOSIS — J45909 Unspecified asthma, uncomplicated: Secondary | ICD-10-CM

## 2022-02-12 DIAGNOSIS — E782 Mixed hyperlipidemia: Secondary | ICD-10-CM

## 2022-02-12 DIAGNOSIS — J3081 Allergic rhinitis due to animal (cat) (dog) hair and dander: Secondary | ICD-10-CM

## 2022-02-12 DIAGNOSIS — I447 Left bundle-branch block, unspecified: Secondary | ICD-10-CM | POA: Diagnosis not present

## 2022-02-12 DIAGNOSIS — R55 Syncope and collapse: Secondary | ICD-10-CM

## 2022-02-12 DIAGNOSIS — K219 Gastro-esophageal reflux disease without esophagitis: Secondary | ICD-10-CM

## 2022-02-12 DIAGNOSIS — K2289 Other specified disease of esophagus: Secondary | ICD-10-CM

## 2022-02-12 DIAGNOSIS — R7309 Other abnormal glucose: Secondary | ICD-10-CM

## 2022-02-12 LAB — BAYER DCA HB A1C WAIVED: HB A1C (BAYER DCA - WAIVED): 6.6 % — ABNORMAL HIGH (ref 4.8–5.6)

## 2022-02-12 LAB — MICROALBUMIN, URINE WAIVED
Creatinine, Urine Waived: 200 mg/dL (ref 10–300)
Microalb, Ur Waived: 30 mg/L — ABNORMAL HIGH (ref 0–19)
Microalb/Creat Ratio: <30 mg/g (ref <30)

## 2022-02-12 MED ORDER — ALBUTEROL SULFATE HFA 108 (90 BASE) MCG/ACT IN AERS: INHALATION_SPRAY | RESPIRATORY_TRACT | 4 refills | Status: DC

## 2022-02-12 NOTE — Assessment & Plan Note (Signed)
A1c trend up to 6.6% today, discussed with patient this would place in diabetes range if consistent.  Has family history of diabetes.  Recommend heavy focus on diet and exercise.  Goal is to avoid medication initiation.  Plan for recheck in 3 months and if continues above 6.5% will diagnose with diabetes and start medication, discussed with patient.

## 2022-02-12 NOTE — Assessment & Plan Note (Signed)
Chronic, ongoing in presence of asthma.  Continue daily Singulair and adjust regimen as needed.

## 2022-02-12 NOTE — Assessment & Plan Note (Signed)
Chronic, ongoing. Continue Prilosec and recommend she take this daily as ordered.  Recommend she follow-up with GI as was recommended. Mag level up to date.

## 2022-02-12 NOTE — Assessment & Plan Note (Signed)
Chronic, stable.  Followed by GI, continue this collaboration.

## 2022-02-12 NOTE — Assessment & Plan Note (Signed)
Overall stable work-up by cardiology, continue this collaboration and pursue neurology referral and further neuro work-up if ongoing.

## 2022-02-12 NOTE — Progress Notes (Signed)
BP 118/70 (BP Location: Left Arm, Patient Position: Sitting, Cuff Size: Normal)   Pulse 84   Temp 98 F (36.7 C) (Oral)   Resp 18   Ht 4' 9.01" (1.448 m)   Wt 116 lb 4.8 oz (52.8 kg)   LMP 01/18/2015 (Approximate)   SpO2 98%   BMI 25.16 kg/m    Subjective:    Patient ID: Denise Proctor, female    DOB: Mar 18, 1960, 62 y.o.   MRN: [id number]  HPI: Denise Proctor is a 62 y.o. female  Chief Complaint  Patient presents with   Hyperlipidemia   Asthma   syncope   HYPOTHYROIDISM Continues on Levothyroxine 50 MCG daily.  Thyroid control status:stable Satisfied with current treatment? yes Medication side effects: no Medication compliance: good compliance Etiology of hypothyroidism:  Recent dose adjustment:no Fatigue: yes Cold intolerance: no Heat intolerance: yes Weight gain: no Weight loss: no Constipation: no Diarrhea/loose stools: no Palpitations: no Lower extremity edema: no Anxiety/depressed mood: no   ASTHMA Continues on Symbicort daily and uses Albuterol as needed + Singulair daily.  Did have cardiac CT done 10/24/21 -- "One hundred cubic mm right middle lobe pulmonary nodule. Non-contrast chest CT at 6-12 months is recommended" Asthma status: stable Satisfied with current treatment?: yes Albuterol/rescue inhaler frequency: once a week Dyspnea frequency: rarely Wheezing frequency: none Cough frequency: occasional Nocturnal symptom frequency: none Limitation of activity: no Current upper respiratory symptoms: no Triggers: pollen Failed/intolerant to following asthma meds: none Asthma meds in past: Advair Aerochamber/spacer use: no Visits to ER or Urgent Care in past year: no Pneumovax: Up to Date Influenza: Up to Date [facility] [place]   GERD Continues on Omeprazole.  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  Recent CT Cardiac did note small hiatal hernia.  GERD control status: stable Satisfied with current treatment? yes Heartburn frequency:  none Medication side effects: no  Medication compliance: stable Previous GERD medications: none Dysphagia:  occasional Odynophagia:  no Hematemesis: no Blood in stool: no EGD: yes  PREDIABETES Last A1c in July 2023 was 6.4%.   Polydipsia/polyuria: no Visual disturbance: no Chest pain: no Paresthesias: no   HYPERLIPIDEMIA Continues on Crestor daily. Last saw cardiology on 01/10/22.  Has syncopal episode 06/22/21 (had one similar months prior) -- was taken to ER. Work-up with cardiology has had no acute findings.   Hyperlipidemia status: good compliance Satisfied with current treatment?  yes Side effects:  no Medication compliance: good compliance Past cholesterol meds: none Supplements: none Aspirin:  no The 10-year ASCVD risk score (Arnett DK, et al., 2019) is: 3.3%   Values used to calculate the score:     Age: 51 years     Sex: Female     Is Non-Hispanic African American: No     Diabetic: No     Tobacco smoker: No     Systolic Blood Pressure: 476 mmHg     Is BP treated: No     HDL Cholesterol: 55 mg/dL     Total Cholesterol: 153 mg/dL Chest pain:  no Coronary artery disease:  no Family history CAD:  yes Family history early CAD:  no     02/12/2022    2:06 PM 08/12/2021    1:13 PM 04/08/2021    1:51 PM 01/04/2021    2:08 PM 01/04/2021    2:05 PM  Depression screen PHQ 2/9  Decreased Interest 0 1 1  0  Down, Depressed, Hopeless 0 0 1 0 0  PHQ -  2 Score 0 1 2 0 0  Altered sleeping 1 1 1 1  0  Tired, decreased energy 1 1 1 1  0  Change in appetite 0 0 1 0 0  Feeling bad or failure about yourself  0 0 0 0 0  Trouble concentrating 0 0 0 1 0  Moving slowly or fidgety/restless 0 0 0 0 0  Suicidal thoughts 0 0 0 0 0  PHQ-9 Score 2 3 5 3  0  Difficult doing work/chores  Not difficult at all  Not difficult at all        02/12/2022    2:06 PM 08/12/2021    1:13 PM 04/08/2021    1:51 PM 01/04/2021    2:09 PM  GAD 7 : Generalized Anxiety Score  Nervous, Anxious, on Edge 1 1  1  0  Control/stop worrying 1 1 1 1   Worry too much - different things 1 0 1 1  Trouble relaxing 0 0 1 0  Restless 0 0 0 0  Easily annoyed or irritable 1 1 1 1   Afraid - awful might happen 0 0 0 0  Total GAD 7 Score 4 3 5 3   Anxiety Difficulty  Somewhat difficult Not difficult at all Not difficult at all   Relevant past medical, surgical, family and social history reviewed and updated as indicated. Interim medical history since our last visit reviewed. Allergies and medications reviewed and updated.  Review of Systems  Constitutional:  Negative for activity change, appetite change, diaphoresis, fatigue and fever.  Respiratory:  Negative for cough, chest tightness and shortness of breath.   Cardiovascular:  Negative for chest pain, palpitations and leg swelling.  Gastrointestinal:  Negative for abdominal distention, abdominal pain, constipation, diarrhea, nausea and vomiting.  Endocrine: Negative for cold intolerance and heat intolerance.  Neurological: Negative.   Psychiatric/Behavioral:  The patient is not nervous/anxious.     Per HPI unless specifically indicated above     Objective:    BP 118/70 (BP Location: Left Arm, Patient Position: Sitting, Cuff Size: Normal)   Pulse 84   Temp 98 F (36.7 C) (Oral)   Resp 18   Ht 4' 9.01" (1.448 m)   Wt 116 lb 4.8 oz (52.8 kg)   LMP 01/18/2015 (Approximate)   SpO2 98%   BMI 25.16 kg/m   Wt Readings from Last 3 Encounters:  02/12/22 116 lb 4.8 oz (52.8 kg)  01/10/22 115 lb 3.2 oz (52.3 kg)  11/27/21 115 lb 4 oz (52.3 kg)    Physical Exam Vitals and nursing note reviewed.  Constitutional:      General: She is awake. She is not in acute distress.    Appearance: She is well-developed. She is not ill-appearing.  HENT:     Head: Normocephalic.     Right Ear: Hearing normal.     Left Ear: Hearing normal.     Nose: Nose normal.     Mouth/Throat:     Mouth: Mucous membranes are moist.  Eyes:     General: Lids are normal.         Right eye: No discharge.        Left eye: No discharge.     Conjunctiva/sclera: Conjunctivae normal.     Pupils: Pupils are equal, round, and reactive to light.  Neck:     Thyroid: No thyromegaly.     Vascular: No carotid bruit.  Cardiovascular:     Rate and Rhythm: Normal rate and regular rhythm.     Heart  sounds: Normal heart sounds. No murmur heard.    No gallop.  Pulmonary:     Effort: Pulmonary effort is normal. No accessory muscle usage or respiratory distress.     Breath sounds: Normal breath sounds.  Abdominal:     General: Bowel sounds are normal.     Palpations: Abdomen is soft.  Musculoskeletal:     Cervical back: Normal range of motion and neck supple.     Right lower leg: No edema.     Left lower leg: No edema.  Skin:    General: Skin is warm and dry.  Neurological:     Mental Status: She is alert and oriented to person, place, and time.  Psychiatric:        Attention and Perception: Attention normal.        Mood and Affect: Mood normal.        Behavior: Behavior normal. Behavior is cooperative.        Thought Content: Thought content normal.        Judgment: Judgment normal.     Results for orders placed or performed in visit on 11/19/21  ECHOCARDIOGRAM COMPLETE  Result Value Ref Range   AR max vel 1.46 cm2   AV Peak grad 8.0 mmHg   Ao pk vel 1.41 m/s   S' Lateral 2.10 cm   Area-P 1/2 4.29 cm2   AV Area VTI 1.39 cm2   AV Mean grad 4.0 mmHg   Single Plane A4C EF 67.8 %   Single Plane A2C EF 69.1 %   Calc EF 69.7 %   AV Area mean vel 1.21 cm2      Assessment & Plan:   Problem List Items Addressed This Visit       Cardiovascular and Mediastinum   LBBB (left bundle branch block)    Ongoing, continue collaboration with cardiology.  No recent changes made.      Relevant Orders   Comprehensive metabolic panel   Lipid Panel w/o Chol/HDL Ratio     Respiratory   Allergic rhinitis    Chronic, ongoing in presence of asthma.  Continue daily Singulair  and adjust regimen as needed.      Asthma - Primary    Chronic, ongoing.  FEV1 77% and FEV1/FVC 64% today (January 2024).  Continue current medication regimen and adjust as needed based on symptoms.  Controlled at this time with no recent exacerbation.  Return in 6 months.      Relevant Medications   albuterol (VENTOLIN HFA) 108 (90 Base) MCG/ACT inhaler   Other Relevant Orders   CBC with Differential/Platelet   Spirometry with graph (Completed)     Digestive   Feline esophagus    Chronic, stable.  Followed by GI, continue this collaboration.      GERD (gastroesophageal reflux disease)    Chronic, ongoing. Continue Prilosec and recommend she take this daily as ordered.  Recommend she follow-up with GI as was recommended. Mag level up to date.        Endocrine   Hypothyroidism    Chronic, ongoing.  Continue current medication regimen and adjust as needed.  Thyroid labs up to date.        Other   Elevated hemoglobin A1c measurement    A1c trend up to 6.6% today, discussed with patient this would place in diabetes range if consistent.  Has family history of diabetes.  Recommend heavy focus on diet and exercise.  Goal is to avoid medication initiation.  Plan  for recheck in 3 months and if continues above 6.5% will diagnose with diabetes and start medication, discussed with patient.      Relevant Orders   Bayer DCA Hb A1c Waived   Microalbumin, Urine Waived   Hyperlipidemia    Chronic, ongoing.  Continue statin and adjust dose as needed. Lipid panel today.      Relevant Orders   Comprehensive metabolic panel   Lipid Panel w/o Chol/HDL Ratio   Syncope and collapse    Overall stable work-up by cardiology, continue this collaboration and pursue neurology referral and further neuro work-up if ongoing.        Follow up plan: Return in about 3 months (around 05/14/2022) for PREDIABETES -- A1c check.

## 2022-02-12 NOTE — Assessment & Plan Note (Signed)
Ongoing, continue collaboration with cardiology.  No recent changes made.

## 2022-02-12 NOTE — Assessment & Plan Note (Signed)
Chronic, ongoing.  FEV1 77% and FEV1/FVC 64% today (January 2024).  Continue current medication regimen and adjust as needed based on symptoms.  Controlled at this time with no recent exacerbation.  Return in 6 months.

## 2022-02-12 NOTE — Assessment & Plan Note (Signed)
Chronic, ongoing.  Continue statin and adjust dose as needed. Lipid panel today.

## 2022-02-12 NOTE — Assessment & Plan Note (Signed)
Chronic, ongoing.  Continue current medication regimen and adjust as needed.  Thyroid labs up to date. 

## 2022-02-13 LAB — COMPREHENSIVE METABOLIC PANEL
ALT: 13 IU/L (ref 0–32)
AST: 17 IU/L (ref 0–40)
Albumin/Globulin Ratio: 1.9 (ref 1.2–2.2)
Albumin: 4.5 g/dL (ref 3.9–4.9)
Alkaline Phosphatase: 41 IU/L — ABNORMAL LOW (ref 44–121)
BUN/Creatinine Ratio: 12 (ref 12–28)
BUN: 9 mg/dL (ref 8–27)
Bilirubin Total: 0.6 mg/dL (ref 0.0–1.2)
CO2: 23 mmol/L (ref 20–29)
Calcium: 9.3 mg/dL (ref 8.7–10.3)
Chloride: 104 mmol/L (ref 96–106)
Creatinine, Ser: 0.74 mg/dL (ref 0.57–1.00)
Globulin, Total: 2.4 g/dL (ref 1.5–4.5)
Glucose: 110 mg/dL — ABNORMAL HIGH (ref 70–99)
Potassium: 4 mmol/L (ref 3.5–5.2)
Sodium: 141 mmol/L (ref 134–144)
Total Protein: 6.9 g/dL (ref 6.0–8.5)
eGFR: 91 mL/min/{1.73_m2} (ref >59)

## 2022-02-13 LAB — CBC WITH DIFFERENTIAL/PLATELET
Basophils Absolute: 0 10*3/uL (ref 0.0–0.2)
Basos: 0 %
EOS (ABSOLUTE): 0.1 10*3/uL (ref 0.0–0.4)
Eos: 1 %
Hematocrit: 39.7 % (ref 34.0–46.6)
Hemoglobin: 13.4 g/dL (ref 11.1–15.9)
Immature Grans (Abs): 0 10*3/uL (ref 0.0–0.1)
Immature Granulocytes: 0 %
Lymphocytes Absolute: 3 10*3/uL (ref 0.7–3.1)
Lymphs: 43 %
MCH: 30.5 pg (ref 26.6–33.0)
MCHC: 33.8 g/dL (ref 31.5–35.7)
MCV: 90 fL (ref 79–97)
Monocytes Absolute: 0.4 10*3/uL (ref 0.1–0.9)
Monocytes: 6 %
Neutrophils Absolute: 3.5 10*3/uL (ref 1.4–7.0)
Neutrophils: 50 %
Platelets: 364 10*3/uL (ref 150–450)
RBC: 4.4 x10E6/uL (ref 3.77–5.28)
RDW: 13 % (ref 11.7–15.4)
WBC: 7 10*3/uL (ref 3.4–10.8)

## 2022-02-13 LAB — LIPID PANEL W/O CHOL/HDL RATIO
Cholesterol, Total: 171 mg/dL (ref 100–199)
HDL: 53 mg/dL (ref >39)
LDL Chol Calc (NIH): 87 mg/dL (ref 0–99)
Triglycerides: 180 mg/dL — ABNORMAL HIGH (ref 0–149)
VLDL Cholesterol Cal: 31 mg/dL (ref 5–40)

## 2022-02-13 NOTE — Progress Notes (Signed)
Contacted via MyChart   Good evening Denise Proctor, your labs have returned: - Kidney function, creatinine and eGFR, remains normal, as is liver function, AST and ALT.  - Cholesterol labs stable, continue Rosuvastatin daily. - Remainder of labs stable.  Any questions? Keep being amazing!!  Thank you for allowing me to participate in your care.  I appreciate you. Kindest regards, Bryann Gentz

## 2022-02-14 ENCOUNTER — Other Ambulatory Visit: Payer: Self-pay | Admitting: Internal Medicine

## 2022-02-17 ENCOUNTER — Other Ambulatory Visit: Payer: Self-pay | Admitting: Nurse Practitioner

## 2022-02-17 MED ORDER — ALBUTEROL SULFATE HFA 108 (90 BASE) MCG/ACT IN AERS: 2.0000 | INHALATION_SPRAY | Freq: Four times a day (QID) | RESPIRATORY_TRACT | 4 refills | Status: AC | PRN

## 2022-02-17 NOTE — Progress Notes (Signed)
Proair HFA covered

## 2022-02-23 ENCOUNTER — Other Ambulatory Visit: Payer: Self-pay | Admitting: Nurse Practitioner

## 2022-02-25 NOTE — Telephone Encounter (Signed)
Requested Prescriptions  Pending Prescriptions Disp Refills   rosuvastatin (CRESTOR) 40 MG tablet [Pharmacy Med Name: ROSUVASTATIN 40MG  TABLETS] 90 tablet 4    Sig: TAKE 1 TABLET(40 MG) BY MOUTH DAILY     Cardiovascular:  Antilipid - Statins 2 Failed - 02/23/2022  2:23 PM      Failed - Lipid Panel in normal range within the last 12 months    Cholesterol, Total  Date Value Ref Range Status  02/12/2022 171 100 - 199 mg/dL Final   Cholesterol Piccolo, Waived  Date Value Ref Range Status  11/06/2014 226 (H) <200 mg/dL Final    Comment:                            Desirable                <200                         Borderline High      200- 239                         High                     >239    LDL Chol Calc (NIH)  Date Value Ref Range Status  02/12/2022 87 0 - 99 mg/dL Final   HDL  Date Value Ref Range Status  02/12/2022 53 >39 mg/dL Final   Triglycerides  Date Value Ref Range Status  02/12/2022 180 (H) 0 - 149 mg/dL Final   Triglycerides Piccolo,Waived  Date Value Ref Range Status  11/06/2014 197 (H) <150 mg/dL Final    Comment:                            Normal                   <150                         Borderline High     150 - 199                         High                200 - 499                         Very High                >499          Passed - Cr in normal range and within 360 days    Creatinine, Ser  Date Value Ref Range Status  02/12/2022 0.74 0.57 - 1.00 mg/dL Final         Passed - Patient is not pregnant      Passed - Valid encounter within last 12 months    Recent Outpatient Visits           1 week ago Persistent asthma without complication, unspecified asthma severity   [place], [place] T, NP   6 months ago Mild persistent asthma without complication   [place], [place] T, NP   10 months ago Elevated  hemoglobin A1c measurement   [place], [place] T, NP   1 year ago Persistent asthma without complication, unspecified asthma severity   [place], Henrine Screws T, NP   1 year ago Hypothyroidism, unspecified type   [place], Barbaraann Faster, NP       Future Appointments             In 2 months [place], Barbera Setters, NP [facility] [place]   In 2 months [place], Barbaraann Faster, NP [place] [place]

## 2022-02-26 ENCOUNTER — Encounter: Payer: Self-pay | Admitting: Nurse Practitioner

## 2022-02-27 NOTE — Telephone Encounter (Unsigned)
Copied from [place] [id number]. Topic: Appointment Scheduling - Scheduling Inquiry for Clinic >> Feb 27, 2022 12:39 PM Ludger Nutting wrote: Patient was advised by [facility] via MyChart to call and schedule an appointment with her ASAP. No appointments available until Monday. Please follow up with patient after lunch to get her scheduled for today or tomorrow.

## 2022-03-06 ENCOUNTER — Ambulatory Visit (INDEPENDENT_AMBULATORY_CARE_PROVIDER_SITE_OTHER): Payer: 59 | Admitting: Nurse Practitioner

## 2022-03-06 ENCOUNTER — Encounter: Payer: Self-pay | Admitting: Nurse Practitioner

## 2022-03-06 VITALS — BP 111/76 | HR 89 | Temp 98.7°F | Ht <= 58 in | Wt 114.6 lb

## 2022-03-06 DIAGNOSIS — R55 Syncope and collapse: Secondary | ICD-10-CM | POA: Diagnosis not present

## 2022-03-06 NOTE — Progress Notes (Signed)
BP 111/76   Pulse 89   Temp 98.7 F (37.1 C) (Oral)   Ht 4' 9.01" (1.448 m)   Wt 114 lb 9.6 oz (52 kg)   LMP 01/18/2015 (Approximate)   SpO2 99%   BMI 24.79 kg/m    Subjective:    Patient ID: Denise Proctor, female    DOB: [birth year], 62 y.o.   MRN: [id number]  HPI: Denise Proctor is a 62 y.o. female  Chief Complaint  Patient presents with   Loss of Consciousness    Happened on 02/22/22 and has EMS check her out at home did not go to ER.   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  Has seen cardiology and work-up at this time has been reassuring -- they discussed possible referral to EP with LBBB noted on EKG in future if ongoing syncope.  Had last syncopal episode on January 20th, she feels the episodes have a couple months in between.  At time was about to run errands with family.  Episodes appear to happen more on weekends, sometimes forgets cholesterol medication on weekends.  Does not always hydrate well on weekends.  Recent A1c had trended up to 6.6% on labs January 2024, focusing on diet.  Had coronary screen on 10/24/21 -- right middle lobe pulmonary nodule with recommend to recheck in 6-12 months.  Also noted small type hiatal hernia.  Duration: months Description of symptoms: room spinning Duration of episode: minutes Dizziness frequency: recurrent Provoking factors:  unknown Aggravating factors:   unknown Triggered by rolling over in bed: no Triggered by bending over: no Aggravated by head movement: no Aggravated by exertion, coughing, loud noises: no Recent head injury: no Recent or current viral symptoms: no History of vasovagal episodes: no Nausea: no Vomiting: no Tinnitus:  no Hearing loss: no Aural fullness: no Headache: no Photophobia/phonophobia: no Unsteady gait: no Postural  instability: no Diplopia, dysarthria, dysphagia or weakness: no Related to exertion: no Pallor: no Diaphoresis: no Dyspnea: no Chest pain: no   Relevant past medical, surgical, family and social history reviewed and updated as indicated. Interim medical history since our last visit reviewed. Allergies and medications reviewed and updated.  Review of Systems  Constitutional:  Negative for activity change, appetite change, diaphoresis, fatigue and fever.  Respiratory:  Negative for cough, chest tightness and shortness of breath.   Cardiovascular:  Negative for chest pain, palpitations and leg swelling.  Gastrointestinal: Negative.   Endocrine: Negative for cold intolerance, heat intolerance, polydipsia, polyphagia and polyuria.  Neurological:  Positive for dizziness and syncope. Negative for tremors, facial asymmetry, weakness, light-headedness, numbness and headaches.  Psychiatric/Behavioral: Negative.      Per HPI unless specifically indicated above     Objective:    BP 111/76   Pulse 89   Temp 98.7 F (37.1 C) (Oral)   Ht 4' 9.01" (1.448 m)   Wt 114 lb 9.6 oz (52 kg)   LMP 01/18/2015 (Approximate)   SpO2 99%   BMI 24.79 kg/m   Wt Readings from Last 3 Encounters:  03/06/22 114 lb 9.6 oz (52 kg)  02/12/22 116 lb 4.8 oz (52.8 kg)  01/10/22 115 lb 3.2 oz (52.3 kg)    Physical Exam Vitals and nursing note reviewed.  Constitutional:      General: She is awake. She is not in acute  distress.    Appearance: She is well-developed. She is not ill-appearing.  HENT:     Head: Normocephalic.     Right Ear: Hearing normal.     Left Ear: Hearing normal.     Nose: Nose normal.     Mouth/Throat:     Mouth: Mucous membranes are moist.  Eyes:     General: Lids are normal.        Right eye: No discharge.        Left eye: No discharge.     Extraocular Movements: Extraocular movements intact.     Conjunctiva/sclera: Conjunctivae normal.     Pupils: Pupils are equal, round, and  reactive to light.     Visual Fields: Right eye visual fields normal and left eye visual fields normal.  Neck:     Thyroid: No thyromegaly.     Vascular: No carotid bruit.  Cardiovascular:     Rate and Rhythm: Normal rate and regular rhythm.     Heart sounds: Normal heart sounds. No murmur heard.    No gallop.  Pulmonary:     Effort: Pulmonary effort is normal. No accessory muscle usage or respiratory distress.     Breath sounds: Normal breath sounds.  Abdominal:     General: Bowel sounds are normal.     Palpations: Abdomen is soft.  Musculoskeletal:     Cervical back: Normal range of motion and neck supple.     Right lower leg: No edema.     Left lower leg: No edema.  Skin:    General: Skin is warm and dry.  Neurological:     Mental Status: She is alert and oriented to person, place, and time.     Cranial Nerves: Cranial nerves 2-12 are intact.     Motor: Motor function is intact.     Coordination: Coordination is intact.     Gait: Gait is intact.     Deep Tendon Reflexes: Reflexes are normal and symmetric.     Reflex Scores:      Brachioradialis reflexes are 2+ on the right side and 2+ on the left side.      Patellar reflexes are 2+ on the right side and 2+ on the left side. Psychiatric:        Attention and Perception: Attention normal.        Mood and Affect: Mood normal.        Behavior: Behavior normal. Behavior is cooperative.        Thought Content: Thought content normal.        Judgment: Judgment normal.     Results for orders placed or performed in visit on 02/12/22  Bayer DCA Hb A1c Waived  Result Value Ref Range   HB A1C (BAYER DCA - WAIVED) 6.6 (H) 4.8 - 5.6 %  Microalbumin, Urine Waived  Result Value Ref Range   Microalb, Ur Waived 30 (H) 0 - 19 mg/L   Creatinine, Urine Waived 200 10 - 300 mg/dL   Microalb/Creat Ratio <30 <30 mg/g  CBC with Differential/Platelet  Result Value Ref Range   WBC 7.0 3.4 - 10.8 x10E3/uL   RBC 4.40 3.77 - 5.28 x10E6/uL    Hemoglobin 13.4 11.1 - 15.9 g/dL   Hematocrit 39.7 34.0 - 46.6 %   MCV 90 79 - 97 fL   MCH 30.5 26.6 - 33.0 pg   MCHC 33.8 31.5 - 35.7 g/dL   RDW 13.0 11.7 - 15.4 %   Platelets 364 150 -  450 x10E3/uL   Neutrophils 50 Not Estab. %   Lymphs 43 Not Estab. %   Monocytes 6 Not Estab. %   Eos 1 Not Estab. %   Basos 0 Not Estab. %   Neutrophils Absolute 3.5 1.4 - 7.0 x10E3/uL   Lymphocytes Absolute 3.0 0.7 - 3.1 x10E3/uL   Monocytes Absolute 0.4 0.1 - 0.9 x10E3/uL   EOS (ABSOLUTE) 0.1 0.0 - 0.4 x10E3/uL   Basophils Absolute 0.0 0.0 - 0.2 x10E3/uL   Immature Granulocytes 0 Not Estab. %   Immature Grans (Abs) 0.0 0.0 - 0.1 x10E3/uL  Comprehensive metabolic panel  Result Value Ref Range   Glucose 110 (H) 70 - 99 mg/dL   BUN 9 8 - 27 mg/dL   Creatinine, Ser 0.74 0.57 - 1.00 mg/dL   eGFR 91 >59 mL/min/1.73   BUN/Creatinine Ratio 12 12 - 28   Sodium 141 134 - 144 mmol/L   Potassium 4.0 3.5 - 5.2 mmol/L   Chloride 104 96 - 106 mmol/L   CO2 23 20 - 29 mmol/L   Calcium 9.3 8.7 - 10.3 mg/dL   Total Protein 6.9 6.0 - 8.5 g/dL   Albumin 4.5 3.9 - 4.9 g/dL   Globulin, Total 2.4 1.5 - 4.5 g/dL   Albumin/Globulin Ratio 1.9 1.2 - 2.2   Bilirubin Total 0.6 0.0 - 1.2 mg/dL   Alkaline Phosphatase 41 (L) 44 - 121 IU/L   AST 17 0 - 40 IU/L   ALT 13 0 - 32 IU/L  Lipid Panel w/o Chol/HDL Ratio  Result Value Ref Range   Cholesterol, Total 171 100 - 199 mg/dL   Triglycerides 180 (H) 0 - 149 mg/dL   HDL 53 >39 mg/dL   VLDL Cholesterol Cal 31 5 - 40 mg/dL   LDL Chol Calc (NIH) 87 0 - 99 mg/dL      Assessment & Plan:   Problem List Items Addressed This Visit       Other   Syncope and collapse - Primary    Ongoing.  Overall reassuring work-up by cardiology, continue this collaboration.  Will place referral to neurology for further assessment due to ongoing episodes, although not worsening, they are also not improving.  No red flags on exam today.      Relevant Orders   Ambulatory referral to  Neurology     Follow up plan: Return for as scheduled in March.

## 2022-03-06 NOTE — Assessment & Plan Note (Signed)
Ongoing.  Overall reassuring work-up by cardiology, continue this collaboration.  Will place referral to neurology for further assessment due to ongoing episodes, although not worsening, they are also not improving.  No red flags on exam today.

## 2022-03-06 NOTE — Patient Instructions (Signed)
Syncope, Adult  Syncope is when you pass out or faint for a short time. It is caused by a sudden decrease in blood flow to the brain. This can happen for many reasons. It can sometimes happen when seeing blood, getting a shot (injection), or having pain or strong emotions. Most causes of fainting are not dangerous, but in some cases it can be a sign of a serious medical problem. If you faint, get help right away. Call your local emergency services (911 in the [place] Follow these instructions at home: Watch for any changes in your symptoms. Take these actions to stay safe and help with your symptoms: Knowing when you may be about to faint Signs that you may be about to faint include: Feeling dizzy or light-headed. It may feel like the room is spinning. Feeling weak. Feeling like you may vomit (nauseous). Seeing spots or seeing all white or all black. Having cold, clammy skin. Feeling warm and sweaty. Hearing ringing in the ears. If you start to feel like you might faint, sit or lie down right away. If sitting, lower your head down between your legs. If lying down, raise (elevate) your feet above the level of your heart. Breathe deeply and steadily. Wait until all of the symptoms are gone. Have someone stay with you until you feel better. Medicines Take over-the-counter and prescription medicines only as told by your doctor. If you are taking blood pressure or heart medicine, sit up and stand up slowly. Spend a few minutes getting ready to sit and then stand. This can help you feel less dizzy. Lifestyle Do not drive, use machinery, or play sports until your doctor says it is okay. Do not drink alcohol. Do not smoke or use any products that contain nicotine or tobacco. If you need help quitting, ask your doctor. Avoid hot tubs and saunas. General instructions Talk with your doctor about your symptoms. You may need to have testing to help find the cause. Drink enough fluid to keep your pee  (urine) pale yellow. Avoid standing for a long time. If you must stand for a long time, do movements such as: Moving your legs. Crossing your legs. Flexing and stretching your leg muscles. Squatting. Keep all follow-up visits. Contact a doctor if: You have episodes of near fainting. Get help right away if: You pass out or faint. You hit your head or are injured after fainting. You have any of these symptoms: Fast or uneven heartbeats (palpitations). Pain in your chest, belly, or back. Shortness of breath. You have jerky movements that you cannot control (seizure). You have a very bad headache. You are confused. You have problems with how you see (vision). You are very weak. You have trouble walking. You are bleeding from your mouth or your butt (rectum). You have black or tarry poop (stool). These symptoms may be an emergency. Get help right away. Call your local emergency services (911 in the [place] Do not wait to see if the symptoms will go away. Do not drive yourself to the hospital. Summary Syncope is when you pass out or faint for a short time. It is caused by a sudden decrease in blood flow to the brain. Signs that you may be about to faint include feeling dizzy or light-headed, feeling like you may vomit, seeing all white or all black, or having cold, clammy skin. If you start to feel like you might faint, sit or lie down right away. Lower your head if sitting, or raise (elevate)   your feet if lying down. Breathe deeply and steadily. Wait until all of the symptoms are gone. This information is not intended to replace advice given to you by your health care provider. Make sure you discuss any questions you have with your health care provider. Document Revised: 05/31/2020 Document Reviewed: 05/31/2020 Elsevier Patient Education  2023 Elsevier Inc.  

## 2022-04-09 ENCOUNTER — Other Ambulatory Visit: Payer: Self-pay | Admitting: Nurse Practitioner

## 2022-04-09 DIAGNOSIS — E039 Hypothyroidism, unspecified: Secondary | ICD-10-CM

## 2022-04-09 MED ORDER — LEVOTHYROXINE SODIUM 50 MCG PO TABS: ORAL_TABLET | ORAL | 1 refills | Status: DC

## 2022-04-09 NOTE — Telephone Encounter (Signed)
Copied from [place] [id number]. Topic: General - Other >> Apr 09, 2022  9:53 AM Everette C wrote: Reason for CRM: Medication Refill - Medication: levothyroxine (SYNTHROID) 50 MCG tablet [id number]  Has the patient contacted their pharmacy? Yes.   (Agent: If no, request that the patient contact the pharmacy for the refill. If patient does not wish to contact the pharmacy document the reason why and proceed with request.) (Agent: If yes, when and what did the pharmacy advise?)  Preferred Pharmacy (with phone number or street name): [facility] [place] [address] [place] [address] Phone: [phone] Fax: [phone] Hours: Not open 24 hours   Has the patient been seen for an appointment in the last year OR does the patient have an upcoming appointment? Yes.    Agent: Please be advised that RX refills may take up to 3 business days. We ask that you follow-up with your pharmacy.

## 2022-04-09 NOTE — Telephone Encounter (Signed)
Change in pharmacy  Requested Prescriptions  Pending Prescriptions Disp Refills   levothyroxine (SYNTHROID) 50 MCG tablet 90 tablet 4    Sig: TAKE 1 TABLET(50 MCG) BY MOUTH DAILY     Endocrinology:  Hypothyroid Agents Passed - 04/09/2022 10:08 AM      Passed - TSH in normal range and within 360 days    TSH  Date Value Ref Range Status  08/12/2021 2.870 0.450 - 4.500 uIU/mL Final         Passed - Valid encounter within last 12 months    Recent Outpatient Visits           1 month ago Syncope and collapse   [place], Ransom T, NP   1 month ago Persistent asthma without complication, unspecified asthma severity   [place], [place] T, NP   8 months ago Mild persistent asthma without complication   [place], Henrine Screws T, NP   1 year ago Elevated hemoglobin A1c measurement   [place], [place] T, NP   1 year ago Persistent asthma without complication, unspecified asthma severity   [facility] [facility] [place], Barbaraann Faster, NP       Future Appointments             In 3 weeks [place], Barbera Setters, NP [place] [facility] [place]   In 1 month [place], Barbaraann Faster, NP [place], [place]

## 2022-04-22 ENCOUNTER — Other Ambulatory Visit: Payer: Self-pay | Admitting: Nurse Practitioner

## 2022-04-22 NOTE — Telephone Encounter (Signed)
Original Rx 04/08/21 #90 4RF- 15 month supply- will refill Requested Prescriptions  Pending Prescriptions Disp Refills   montelukast (SINGULAIR) 10 MG tablet [Pharmacy Med Name: MONTELUKAST 10MG  TABLETS] 90 tablet 4    Sig: TAKE 1 TABLET(10 MG) BY MOUTH AT BEDTIME     Pulmonology:  Leukotriene Inhibitors Passed - 04/22/2022  3:14 AM      Passed - Valid encounter within last 12 months    Recent Outpatient Visits           1 month ago Syncope and collapse   [place], [place]ver T, NP   2 months ago Persistent asthma without complication, unspecified asthma severity   [place], [place] T, NP   8 months ago Mild persistent asthma without complication   [place], Henrine Screws T, NP   1 year ago Elevated hemoglobin A1c measurement   [place], [place] T, NP   1 year ago Persistent asthma without complication, unspecified asthma severity   [place], Barbaraann Faster, NP       Future Appointments             In 1 week [place], Barbera Setters, NP [place] at [place]   In 3 weeks [place], Barbaraann Faster, NP [place], [place]

## 2022-04-30 ENCOUNTER — Ambulatory Visit: Payer: 59 | Admitting: Cardiology

## 2022-05-05 ENCOUNTER — Ambulatory Visit: Payer: 59 | Admitting: Cardiology

## 2022-05-09 ENCOUNTER — Other Ambulatory Visit: Payer: Self-pay | Admitting: Physician Assistant

## 2022-05-09 DIAGNOSIS — Z8673 Personal history of transient ischemic attack (TIA), and cerebral infarction without residual deficits: Secondary | ICD-10-CM

## 2022-05-09 DIAGNOSIS — R569 Unspecified convulsions: Secondary | ICD-10-CM

## 2022-05-14 ENCOUNTER — Ambulatory Visit: Payer: 59 | Admitting: Nurse Practitioner

## 2022-05-14 DIAGNOSIS — R7309 Other abnormal glucose: Secondary | ICD-10-CM

## 2022-05-14 DIAGNOSIS — E782 Mixed hyperlipidemia: Secondary | ICD-10-CM

## 2022-05-16 ENCOUNTER — Ambulatory Visit (INDEPENDENT_AMBULATORY_CARE_PROVIDER_SITE_OTHER): Payer: 59 | Admitting: Nurse Practitioner

## 2022-05-16 ENCOUNTER — Encounter: Payer: Self-pay | Admitting: Nurse Practitioner

## 2022-05-16 VITALS — BP 111/75 | HR 86 | Temp 98.6°F | Ht <= 58 in | Wt 111.9 lb

## 2022-05-16 DIAGNOSIS — R7309 Other abnormal glucose: Secondary | ICD-10-CM

## 2022-05-16 LAB — BAYER DCA HB A1C WAIVED: HB A1C (BAYER DCA - WAIVED): 6.3 % — ABNORMAL HIGH (ref 4.8–5.6)

## 2022-05-16 NOTE — Assessment & Plan Note (Signed)
A1c trend up to 6.6% in January, discussed with patient this would place in diabetes range if consistent.  Has family history of diabetes.  Recheck today and if trend up will start medication.  Recommend heavy focus on diet and exercise.  Goal is to avoid medication initiation if possible.

## 2022-05-16 NOTE — Progress Notes (Signed)
Contacted via MyChart   Good afternoon Denise Proctor, your A1c did trend down to 6.3%, back in prediabetic range.  You had been 6.6%.  Prediabetes being 5.7 to 6.4% and diabetes 6.5% or greater.  Continue to focus heavily on diet changes and regular activity at home.  Goal is to get out of prediabetes range as well:)

## 2022-05-16 NOTE — Patient Instructions (Signed)

## 2022-05-16 NOTE — Progress Notes (Signed)
BP 111/75   Pulse 86   Temp 98.6 F (37 C) (Oral)   Ht 4' 9.01" (1.448 m)   Wt 111 lb 14.4 oz (50.8 kg)   LMP 01/18/2015 (Approximate)   SpO2 98%   BMI 24.21 kg/m    Subjective:    Patient ID: NALA STEINMEYER, female    DOB: [birth year], 62 y.o.   MRN: [id number]  HPI: RONDA LUBARSKY is a 62 y.o. female  Chief Complaint  Patient presents with   Prediabetes   PREDIABETES Last A1c in January was trending up to 6.6%, she had wanted to avoid medication and focus on diet changes at time. Has been working on changes for overall health.    She is followed by neurology for syncopal episodes, last visit 05/06/22.  EEG stable, they recommend MRI but she is not going to pursue at this time.  No further syncopal episodes since January.   Hypoglycemic episodes:no Polydipsia/polyuria: no Visual disturbance: no Chest pain: no Paresthesias: no  Relevant past medical, surgical, family and social history reviewed and updated as indicated. Interim medical history since our last visit reviewed. Allergies and medications reviewed and updated.  Review of Systems  Constitutional:  Negative for activity change, appetite change, diaphoresis, fatigue and fever.  Respiratory:  Negative for cough, chest tightness and shortness of breath.   Cardiovascular:  Negative for chest pain, palpitations and leg swelling.  Gastrointestinal: Negative.   Endocrine: Negative for cold intolerance, heat intolerance, polydipsia, polyphagia and polyuria.  Neurological: Negative.   Psychiatric/Behavioral: Negative.     Per HPI unless specifically indicated above     Objective:    BP 111/75   Pulse 86   Temp 98.6 F (37 C) (Oral)   Ht 4' 9.01" (1.448 m)   Wt 111 lb 14.4 oz (50.8 kg)   LMP 01/18/2015 (Approximate)   SpO2 98%   BMI 24.21 kg/m   Wt Readings from Last 3 Encounters:  05/16/22 111 lb 14.4 oz (50.8 kg)  03/06/22 114 lb 9.6 oz (52 kg)  02/12/22 116 lb 4.8 oz (52.8 kg)    Physical Exam Vitals  and nursing note reviewed.  Constitutional:      General: She is awake. She is not in acute distress.    Appearance: She is well-developed. She is not ill-appearing.  HENT:     Head: Normocephalic.     Right Ear: Hearing normal.     Left Ear: Hearing normal.     Nose: Nose normal.     Mouth/Throat:     Mouth: Mucous membranes are moist.  Eyes:     General: Lids are normal.        Right eye: No discharge.        Left eye: No discharge.     Extraocular Movements: Extraocular movements intact.     Conjunctiva/sclera: Conjunctivae normal.     Pupils: Pupils are equal, round, and reactive to light.     Visual Fields: Right eye visual fields normal and left eye visual fields normal.  Neck:     Thyroid: No thyromegaly.     Vascular: No carotid bruit.  Cardiovascular:     Rate and Rhythm: Normal rate and regular rhythm.     Heart sounds: Normal heart sounds. No murmur heard.    No gallop.  Pulmonary:     Effort: Pulmonary effort is normal. No accessory muscle usage or respiratory distress.     Breath sounds: Normal breath sounds.  Abdominal:  General: Bowel sounds are normal.     Palpations: Abdomen is soft.  Musculoskeletal:     Cervical back: Normal range of motion and neck supple.     Right lower leg: No edema.     Left lower leg: No edema.  Skin:    General: Skin is warm and dry.  Neurological:     Mental Status: She is alert and oriented to person, place, and time.     Cranial Nerves: Cranial nerves 2-12 are intact.     Motor: Motor function is intact.     Coordination: Coordination is intact.     Gait: Gait is intact.     Deep Tendon Reflexes: Reflexes are normal and symmetric.     Reflex Scores:      Brachioradialis reflexes are 2+ on the right side and 2+ on the left side.      Patellar reflexes are 2+ on the right side and 2+ on the left side. Psychiatric:        Attention and Perception: Attention normal.        Mood and Affect: Mood normal.        Behavior:  Behavior normal. Behavior is cooperative.        Thought Content: Thought content normal.        Judgment: Judgment normal.    Results for orders placed or performed in visit on 02/12/22  Bayer DCA Hb A1c Waived  Result Value Ref Range   HB A1C (BAYER DCA - WAIVED) 6.6 (H) 4.8 - 5.6 %  Microalbumin, Urine Waived  Result Value Ref Range   Microalb, Ur Waived 30 (H) 0 - 19 mg/L   Creatinine, Urine Waived 200 10 - 300 mg/dL   Microalb/Creat Ratio <30 <30 mg/g  CBC with Differential/Platelet  Result Value Ref Range   WBC 7.0 3.4 - 10.8 x10E3/uL   RBC 4.40 3.77 - 5.28 x10E6/uL   Hemoglobin 13.4 11.1 - 15.9 g/dL   Hematocrit 16.1 09.6 - 46.6 %   MCV 90 79 - 97 fL   MCH 30.5 26.6 - 33.0 pg   MCHC 33.8 31.5 - 35.7 g/dL   RDW 04.5 40.9 - 81.1 %   Platelets 364 150 - 450 x10E3/uL   Neutrophils 50 Not Estab. %   Lymphs 43 Not Estab. %   Monocytes 6 Not Estab. %   Eos 1 Not Estab. %   Basos 0 Not Estab. %   Neutrophils Absolute 3.5 1.4 - 7.0 x10E3/uL   Lymphocytes Absolute 3.0 0.7 - 3.1 x10E3/uL   Monocytes Absolute 0.4 0.1 - 0.9 x10E3/uL   EOS (ABSOLUTE) 0.1 0.0 - 0.4 x10E3/uL   Basophils Absolute 0.0 0.0 - 0.2 x10E3/uL   Immature Granulocytes 0 Not Estab. %   Immature Grans (Abs) 0.0 0.0 - 0.1 x10E3/uL  Comprehensive metabolic panel  Result Value Ref Range   Glucose 110 (H) 70 - 99 mg/dL   BUN 9 8 - 27 mg/dL   Creatinine, Ser 9.14 0.57 - 1.00 mg/dL   eGFR 91 >78 GN/FAO/1.30   BUN/Creatinine Ratio 12 12 - 28   Sodium 141 134 - 144 mmol/L   Potassium 4.0 3.5 - 5.2 mmol/L   Chloride 104 96 - 106 mmol/L   CO2 23 20 - 29 mmol/L   Calcium 9.3 8.7 - 10.3 mg/dL   Total Protein 6.9 6.0 - 8.5 g/dL   Albumin 4.5 3.9 - 4.9 g/dL   Globulin, Total 2.4 1.5 - 4.5 g/dL   Albumin/Globulin  Ratio 1.9 1.2 - 2.2   Bilirubin Total 0.6 0.0 - 1.2 mg/dL   Alkaline Phosphatase 41 (L) 44 - 121 IU/L   AST 17 0 - 40 IU/L   ALT 13 0 - 32 IU/L  Lipid Panel w/o Chol/HDL Ratio  Result Value Ref Range    Cholesterol, Total 171 100 - 199 mg/dL   Triglycerides 161 (H) 0 - 149 mg/dL   HDL 53 >09 mg/dL   VLDL Cholesterol Cal 31 5 - 40 mg/dL   LDL Chol Calc (NIH) 87 0 - 99 mg/dL      Assessment & Plan:   Problem List Items Addressed This Visit       Other   Elevated hemoglobin A1c measurement - Primary    A1c trend up to 6.6% in January, discussed with patient this would place in diabetes range if consistent.  Has family history of diabetes.  Recheck today and if trend up will start medication.  Recommend heavy focus on diet and exercise.  Goal is to avoid medication initiation if possible.      Relevant Orders   Bayer DCA Hb A1c Waived     Follow up plan: Return in about 3 months (around 08/14/2022) for Annual physical after 08/13/22.

## 2022-05-27 ENCOUNTER — Ambulatory Visit: Payer: 59 | Attending: Cardiology | Admitting: Cardiology

## 2022-05-27 ENCOUNTER — Encounter: Payer: Self-pay | Admitting: Cardiology

## 2022-05-27 VITALS — BP 120/62 | HR 88 | Ht <= 58 in | Wt 113.0 lb

## 2022-05-27 DIAGNOSIS — E785 Hyperlipidemia, unspecified: Secondary | ICD-10-CM | POA: Diagnosis not present

## 2022-05-27 DIAGNOSIS — R55 Syncope and collapse: Secondary | ICD-10-CM | POA: Diagnosis not present

## 2022-05-27 DIAGNOSIS — R7303 Prediabetes: Secondary | ICD-10-CM

## 2022-05-27 DIAGNOSIS — E039 Hypothyroidism, unspecified: Secondary | ICD-10-CM

## 2022-05-27 NOTE — Progress Notes (Signed)
Cardiology Office Note:   Date:  05/27/2022  ID:  Denise Proctor, DOB 1960/10/17, MRN [id number]  History of Present Illness:   Denise Proctor is a 62 y.o. female with a past medical history of hyperlipidemia, prediabetes, and syncope with collapse, who is here today for follow-up.  She previously been evaluated in the office on 10/16/2021 by Dr. Okey Dupre for recent syncopal episodes.  They have started happening since May 2023.  She states she was likely unconscious for approximately 30 seconds.  Her evaluation in the emergency department was unremarkable and stated that her primary care provider thought that there may be a vascular issue in her neck that was causing episodes.  Coronary CTA was completed in 10/25/2021 which revealed calcium score of 0.  Carotid duplex was completed 11/19/2021 with normal results.  Echocardiogram revealed normal LVEF, no regional wall motion abnormalities, G2 DD, and trivial mitral regurgitation.  She was last seen in clinic 01/10/2022 where she was doing fairly well at that time she had noted no further episodes of syncope.  She had recently worn a ZIO AT monitor which showed no episodes for concern or causes linked to her syncope.  She returns to clinic today stating that she has been doing fairly well.  She is continue to work with her PCP for her prediabetes as they are trying to prevent her from going on medications.  She has denied having any further syncopal episodes she has had since early January.  She denies any chest pain, shortness of breath, peripheral edema, lightheadedness dizziness, or recent syncope.  She is denying any further evaluations in the emergency department or any recent hospitalizations.  ROS: A 10 point review of systems has been reviewed and is considered negative with exception of what is in the HPI  Studies Reviewed:    EKG: No new tracings were completed today  Heart Monitor 12/24/21   Two separate monitors were worn for a total enrollment  period of 12 days, 22 hours.   The predominant rhythm was sinus with underlying intraventricular conduction delay.  The average rate was 79 bpm (range 47 - 141 bpm).   There were rare PAC's and PVC's.   No sustained arrhythmia or prolonged pause was observed.   Patient triggered events correspond to normal sinus rhythm, sinus tachycardia, and artifact.   Predominantly sinus rhythm with rare PAC's and PVC's.  No significant arrhythmia identified.  TTE 11/19/21 1. Left ventricular ejection fraction, by estimation, is 60 to 65%. The  left ventricle has normal function. The left ventricle has no regional  wall motion abnormalities. Left ventricular diastolic parameters are  consistent with Grade II diastolic  dysfunction (pseudonormalization).   2. Right ventricular systolic function is normal. The right ventricular  size is normal. Tricuspid regurgitation signal is inadequate for assessing  PA pressure.   3. The mitral valve is normal in structure. Trivial mitral valve  regurgitation. No evidence of mitral stenosis.   4. The aortic valve is tricuspid. Aortic valve regurgitation is not  visualized. No aortic stenosis is present.   5. The inferior vena cava is normal in size with greater than 50%  respiratory variability, suggesting right atrial pressure of 3 mmHg.    Risk Assessment/Calculations:              Physical Exam:   VS:  BP 120/62 (BP Location: Left Arm, Patient Position: Sitting, Cuff Size: Normal)   Pulse 88   Ht  (1.473 m)   Wt  113 lb (51.3 kg)   LMP 01/18/2015 (Approximate)   SpO2 97%   BMI 23.62 kg/m    Wt Readings from Last 3 Encounters:  05/27/22 113 lb (51.3 kg)  05/16/22 111 lb 14.4 oz (50.8 kg)  03/06/22 114 lb 9.6 oz (52 kg)     GEN: Well nourished, well developed in no acute distress NECK: No JVD; No carotid bruits CARDIAC: RRR, no murmurs, rubs, gallops RESPIRATORY:  Clear to auscultation without rales, wheezing or rhonchi  ABDOMEN: Soft,  non-tender, non-distended EXTREMITIES:  No edema; No deformity   ASSESSMENT AND PLAN:   History of syncopal episodes with collapse.  ZIO AT monitor revealed no arrhythmias.  Workup was essentially unrevealing.  She stated that she also followed up with neurology and had head CTs and various studies that were done that were unrevealing as well.  Thankfully she has not had any further episodes since around the beginning of January.  After further conversation with her PCP she states that she was told it was likely related to her sugar.  She has been working with her to get her A1c where it needs to be where she does not have to start medications.  She is encouraged to stay well-hydrated and decrease her caffeine.  She is continued on aspirin 81 mg daily.  If she continues with recurrent episodes of the syncope with a known left bundle branch block on EKG, no structural abnormalities found on echocardiogram, can consider referral to EP for further evaluation  Mixed hyperlipidemia with a last LDL of 87 02/12/2022.  She is continued on rosuvastatin 40 mg daily.  Hypothyroidism she is continued on levothyroxine.  This continues to be managed by her PCP.  Prediabetes with a last hemoglobin A1c of 6.3.  This is managed by her PCP she continues to work to get her A1c down.  This is to prevent her from having to go on oral medications.  Disposition patient return to clinic to see MD/APP in 6 months or sooner if needed        Signed, Jansen Goodpasture, NP

## 2022-05-27 NOTE — Patient Instructions (Signed)
Medication Instructions:  Your physician recommends that you continue on your current medications as directed. Please refer to the Current Medication list given to you today.  *If you need a refill on your cardiac medications before your next appointment, please call your pharmacy*  Lab Work: No labs ordered  If you have labs (blood work) drawn today and your tests are completely normal, you will receive your results only by: MyChart Message (if you have MyChart) OR A paper copy in the mail If you have any lab test that is abnormal or we need to change your treatment, we will call you to review the results.  Testing/Procedures: No testing ordered  Follow-Up: At [facility], you and your health needs are our priority.  As part of our continuing mission to provide you with exceptional heart care, we have created designated Provider Care Teams.  These Care Teams include your primary Cardiologist (physician) and Advanced Practice Providers (APPs -  Physician Assistants and Nurse Practitioners) who all work together to provide you with the care you need, when you need it.  We recommend signing up for the patient portal called "MyChart".  Sign up information is provided on this After Visit Summary.  MyChart is used to connect with patients for Virtual Visits (Telemedicine).  Patients are able to view lab/test results, encounter notes, upcoming appointments, etc.  Non-urgent messages can be sent to your provider as well.   To learn more about what you can do with MyChart, go to [email or web address].    Your next appointment:   6 month(s)  Provider:   You may see Dr. Cristal Deer End or one of the following Advanced Practice Providers on your designated Care Team:   Nicolasa Ducking, NP Eula Listen, PA-C Cadence Fransico Michael, PA-C Charlsie Quest, NP

## 2022-07-12 ENCOUNTER — Other Ambulatory Visit: Payer: Self-pay | Admitting: Nurse Practitioner

## 2022-07-12 DIAGNOSIS — E039 Hypothyroidism, unspecified: Secondary | ICD-10-CM

## 2022-07-14 NOTE — Telephone Encounter (Signed)
Refilled 04/09/22 # 90 with 1 refill. Requested Prescriptions  Refused Prescriptions Disp Refills   levothyroxine (SYNTHROID) 50 MCG tablet [Pharmacy Med Name: LEVOTHYROXINE 0.05MG  ( ) TAB] 90 tablet 1    Sig: TAKE 1 TABLET(50 MCG) BY MOUTH DAILY     Endocrinology:  Hypothyroid Agents Passed - 07/12/2022  8:09 AM      Passed - TSH in normal range and within 360 days    TSH  Date Value Ref Range Status  08/12/2021 2.870 0.450 - 4.500 uIU/mL Final         Passed - Valid encounter within last 12 months    Recent Outpatient Visits           1 month ago Elevated hemoglobin A1c measurement   [place] [facility] [place], [place] T, NP   4 months ago Syncope and collapse   [place] [facility] [place], [place] T, NP   5 months ago Persistent asthma without complication, unspecified asthma severity   [place] [facility] [place], [place] T, NP   11 months ago Mild persistent asthma without complication   [place] [facility], [place] T, NP   1 year ago Elevated hemoglobin A1c measurement   [place] [facility] [place], Dorie Rank, NP

## 2022-07-24 ENCOUNTER — Other Ambulatory Visit: Payer: Self-pay | Admitting: Nurse Practitioner

## 2022-07-24 NOTE — Telephone Encounter (Signed)
Requested Prescriptions  Pending Prescriptions Disp Refills   montelukast (SINGULAIR) 10 MG tablet [Pharmacy Med Name: MONTELUKAST 10MG  TABLETS] 90 tablet 0    Sig: TAKE 1 TABLET(10 MG) BY MOUTH AT BEDTIME     Pulmonology:  Leukotriene Inhibitors Passed - 07/24/2022  3:13 AM      Passed - Valid encounter within last 12 months    Recent Outpatient Visits           2 months ago Elevated hemoglobin A1c measurement   [place] [facility] [place], [place] T, NP   4 months ago Syncope and collapse   [place] [facility] [place], [place] T, NP   5 months ago Persistent asthma without complication, unspecified asthma severity   [facility] [place], [place] T, NP   11 months ago Mild persistent asthma without complication   [facility] [place], [place] T, NP   1 year ago Elevated hemoglobin A1c measurement   [place] [facility] [place], Dorie Rank, NP

## 2022-09-11 NOTE — Patient Instructions (Addendum)
Your mammogram is scheduled for Friday 10/03/22 at 1:40 PM at [facility] at [facility].  Address: [address], [place], [place] [address] Phone: [phone]    Prediabetes Eating Plan Prediabetes is a condition that causes blood sugar (glucose) levels to be higher than normal. This increases the risk for developing type 2 diabetes (type 2 diabetes mellitus). Working with a health care provider or nutrition specialist (dietitian) to make diet and lifestyle changes can help prevent the onset of diabetes. These changes may help you: Control your blood glucose levels. Improve your cholesterol levels. Manage your blood pressure. What are tips for following this plan? Reading food labels Read food labels to check the amount of fat, salt (sodium), and sugar in prepackaged foods. Avoid foods that have: Saturated fats. Trans fats. Added sugars. Avoid foods that have more than 300 milligrams (mg) of sodium per serving. Limit your sodium intake to less than 2,300 mg each day. Shopping Avoid buying pre-made and processed foods. Avoid buying drinks with added sugar. Cooking Cook with olive oil. Do not use butter, lard, or ghee. Bake, broil, grill, steam, or boil foods. Avoid frying. Meal planning  Work with your dietitian to create an eating plan that is right for you. This may include tracking how many calories you take in each day. Use a food diary, notebook, or mobile application to track what you eat at each meal. Consider following a Mediterranean diet. This includes: Eating several servings of fresh fruits and vegetables each day. Eating fish at least twice a week. Eating one serving each day of whole grains, beans, nuts, and seeds. Using olive oil instead of other fats. Limiting alcohol. Limiting red meat. Using nonfat or low-fat dairy products. Consider following a plant-based diet. This includes dietary choices that focus on eating mostly vegetables  and fruit, grains, beans, nuts, and seeds. If you have high blood pressure, you may need to limit your sodium intake or follow a diet such as the DASH (Dietary Approaches to Stop Hypertension) eating plan. The DASH diet aims to lower high blood pressure. Lifestyle Set weight loss goals with help from your health care team. It is recommended that most people with prediabetes lose 7% of their body weight. Exercise for at least 30 minutes 5 or more days a week. Attend a support group or seek support from a mental health counselor. Take over-the-counter and prescription medicines only as told by your health care provider. What foods are recommended? Fruits Berries. Bananas. Apples. Oranges. Grapes. Papaya. Mango. Pomegranate. Kiwi. Grapefruit. Cherries. Vegetables Lettuce. Spinach. Peas. Beets. Cauliflower. Cabbage. Broccoli. Carrots. Tomatoes. Squash. Eggplant. Herbs. Peppers. Onions. Cucumbers. Brussels sprouts. Grains Whole grains, such as whole-wheat or whole-grain breads, crackers, cereals, and pasta. Unsweetened oatmeal. Bulgur. Barley. Quinoa. Brown rice. Corn or whole-wheat flour tortillas or taco shells. Meats and other proteins Seafood. Poultry without skin. Lean cuts of pork and beef. Tofu. Eggs. Nuts. Beans. Dairy Low-fat or fat-free dairy products, such as yogurt, cottage cheese, and cheese. Beverages Water. Tea. Coffee. Sugar-free or diet soda. Seltzer water. Low-fat or nonfat milk. Milk alternatives, such as soy or almond milk. Fats and oils Olive oil. Canola oil. Sunflower oil. Grapeseed oil. Avocado. Walnuts. Sweets and desserts Sugar-free or low-fat pudding. Sugar-free or low-fat ice cream and other frozen treats. Seasonings and condiments Herbs. Sodium-free spices. Mustard. Relish. Low-salt, low-sugar ketchup. Low-salt, low-sugar barbecue sauce. Low-fat or fat-free mayonnaise. The items listed above may not be a complete list of recommended foods and  beverages. Contact a  dietitian for more information. What foods are not recommended? Fruits Fruits canned with syrup. Vegetables Canned vegetables. Frozen vegetables with butter or cream sauce. Grains Refined white flour and flour products, such as bread, pasta, snack foods, and cereals. Meats and other proteins Fatty cuts of meat. Poultry with skin. Breaded or fried meat. Processed meats. Dairy Full-fat yogurt, cheese, or milk. Beverages Sweetened drinks, such as iced tea and soda. Fats and oils Butter. Lard. Ghee. Sweets and desserts Baked goods, such as cake, cupcakes, pastries, cookies, and cheesecake. Seasonings and condiments Spice mixes with added salt. Ketchup. Barbecue sauce. Mayonnaise. The items listed above may not be a complete list of foods and beverages that are not recommended. Contact a dietitian for more information. Where to find more information American Diabetes Association: www.diabetes.org Summary You may need to make diet and lifestyle changes to help prevent the onset of diabetes. These changes can help you control blood sugar, improve cholesterol levels, and manage blood pressure. Set weight loss goals with help from your health care team. It is recommended that most people with prediabetes lose 7% of their body weight. Consider following a Mediterranean diet. This includes eating plenty of fresh fruits and vegetables, whole grains, beans, nuts, seeds, fish, and low-fat dairy, and using olive oil instead of other fats. This information is not intended to replace advice given to you by your health care provider. Make sure you discuss any questions you have with your health care provider. Document Revised: 04/21/2019 Document Reviewed: 04/21/2019 Elsevier Patient Education  2024 [employer].

## 2022-09-12 ENCOUNTER — Ambulatory Visit (INDEPENDENT_AMBULATORY_CARE_PROVIDER_SITE_OTHER): Payer: 59 | Admitting: Nurse Practitioner

## 2022-09-12 ENCOUNTER — Encounter: Payer: Self-pay | Admitting: Nurse Practitioner

## 2022-09-12 VITALS — BP 113/69 | HR 89 | Temp 98.8°F | Ht <= 58 in | Wt 109.4 lb

## 2022-09-12 DIAGNOSIS — J3081 Allergic rhinitis due to animal (cat) (dog) hair and dander: Secondary | ICD-10-CM

## 2022-09-12 DIAGNOSIS — K2289 Other specified disease of esophagus: Secondary | ICD-10-CM

## 2022-09-12 DIAGNOSIS — E039 Hypothyroidism, unspecified: Secondary | ICD-10-CM | POA: Diagnosis not present

## 2022-09-12 DIAGNOSIS — Z1231 Encounter for screening mammogram for malignant neoplasm of breast: Secondary | ICD-10-CM

## 2022-09-12 DIAGNOSIS — K219 Gastro-esophageal reflux disease without esophagitis: Secondary | ICD-10-CM

## 2022-09-12 DIAGNOSIS — E559 Vitamin D deficiency, unspecified: Secondary | ICD-10-CM

## 2022-09-12 DIAGNOSIS — J4520 Mild intermittent asthma, uncomplicated: Secondary | ICD-10-CM

## 2022-09-12 DIAGNOSIS — I447 Left bundle-branch block, unspecified: Secondary | ICD-10-CM

## 2022-09-12 DIAGNOSIS — Z0000 Encounter for general adult medical examination without abnormal findings: Secondary | ICD-10-CM

## 2022-09-12 DIAGNOSIS — E782 Mixed hyperlipidemia: Secondary | ICD-10-CM | POA: Diagnosis not present

## 2022-09-12 DIAGNOSIS — R7309 Other abnormal glucose: Secondary | ICD-10-CM

## 2022-09-12 LAB — BAYER DCA HB A1C WAIVED: HB A1C (BAYER DCA - WAIVED): 6.5 % — ABNORMAL HIGH (ref 4.8–5.6)

## 2022-09-12 MED ORDER — FLUTICASONE FUROATE-VILANTEROL 100-25 MCG/ACT IN AEPB: 1.0000 | INHALATION_SPRAY | Freq: Every day | RESPIRATORY_TRACT | 4 refills | Status: DC

## 2022-09-12 NOTE — Assessment & Plan Note (Signed)
Chronic, ongoing.  FEV1 77% and FEV1/FVC 64% (January 2024).  Continues to use Albuterol frequently, will trial a change to Breo inhaler to see if improvement in symptoms with this.  Discussed with patient.  Educated her on this.  If too costly could try Advair Diskus.  Continue Albuterol and Singulair as ordered.

## 2022-09-12 NOTE — Assessment & Plan Note (Signed)
A1c trend up to 6.5% in today (previous 6.3% and 6.6%), this places her in diabetes for second time.  Has family history of diabetes.  Will discuss with her initiation of Metformin 500 MG BID, if she agrees with plan of care to start then will order and have her return in 4 weeks for follow-up ad 3 months for A1c recheck.

## 2022-09-12 NOTE — Assessment & Plan Note (Signed)
Chronic, stable.  Followed by GI, continue this collaboration.  Recommend she schedule follow-up with them.

## 2022-09-12 NOTE — Assessment & Plan Note (Signed)
Chronic, ongoing.  Continue current medication regimen and adjust as needed. Thyroid labs obtained today.

## 2022-09-12 NOTE — Progress Notes (Signed)
BP 113/69   Pulse 89   Temp 98.8 F (37.1 C) (Oral)   Ht 4' 9.1" (1.45 m)   Wt 109 lb 6.4 oz (49.6 kg)   LMP 01/18/2015 (Approximate)   SpO2 98%   BMI 23.59 kg/m    Subjective:    Patient ID: Denise Proctor, female    DOB: Jun 27, 1960, 62 y.o.   MRN: [id number]  HPI: Denise Proctor is a 62 y.o. female presenting on 09/12/2022 for comprehensive medical examination. Current medical complaints include: none  She currently lives with: son Menopausal Symptoms: no   PREDIABETES April 2024 6.3%, prior had been 6.6% and she worked on diet. Polydipsia/polyuria: no Visual disturbance: no Chest pain: no Paresthesias: no  HYPOTHYROIDISM Continues on Levothyroxine 50 MCG daily. Not currently taking Vitamin D supplement, history of lows.  Had last visit with cardiology on 05/27/22 and neurology on 08/01/22, no changes made.  Follows for syncopal episodes which has improved with diet changes and lowering sugar levels -- last episode February 22, 2022. Thyroid control status:stable Satisfied with current treatment? yes Medication side effects: no Medication compliance: good compliance Etiology of hypothyroidism:  Recent dose adjustment:no Fatigue: no Cold intolerance: no Heat intolerance: occasional Weight gain: no Weight loss: no Constipation: occasional Diarrhea/loose stools: no Palpitations: no Lower extremity edema: no Anxiety/depressed mood: yes -- just had son arrested, increased stressors  ASTHMA Takes Symbicort daily and uses Proair as needed + Singulair daily. 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  Continues on Omeprazole.  Has a little polyp in back of throat which irritates her at times. GERD control status: stable Satisfied with current treatment? yes Heartburn frequency: none Medication side effects: no  Medication compliance: stable Previous GERD medications: none Dysphagia: no Odynophagia:  no Hematemesis: no Blood in stool: no EGD: yes  HYPERLIPIDEMIA Continues on Crestor daily. Hyperlipidemia status: good compliance Satisfied with current treatment?  yes Side effects:  no Medication compliance: good compliance Past cholesterol meds: none Supplements: none Aspirin:  no The 10-year ASCVD risk score (Arnett DK, et al., 2019) is: 3.3%   Values used to calculate the score:     Age: 22 years     Sex: Female     Is Non-Hispanic African American: No     Diabetic: No     Tobacco smoker: No     Systolic Blood Pressure: 113 mmHg     Is BP treated: No     HDL Cholesterol: 53 mg/dL     Total Cholesterol: 171 mg/dL Chest pain:  no Coronary artery disease:  no Family history CAD:  yes Family history early CAD:  no  Depression Screen done today and results listed below:     09/12/2022    2:13 PM 05/16/2022    1:54 PM 03/06/2022   11:30 AM 02/12/2022    2:06 PM 08/12/2021    1:13 PM  Depression screen PHQ 2/9  Decreased Interest 1 1 0 0 1  Down, Depressed, Hopeless 1 0 0 0 0  PHQ - 2 Score  2 1 0 0 1  Altered sleeping 0 0 1 1 1   Tired, decreased energy 1 1 0 1 1  Change in appetite 0 0 0 0 0  Feeling bad or failure about yourself  0 0 0 0 0  Trouble concentrating 0 1 0 0 0  Moving slowly or fidgety/restless 0 1 0 0 0  Suicidal thoughts 0 0 0 0 0  PHQ-9 Score 3 4 1 2 3   Difficult doing work/chores Not difficult at all  Not difficult at all  Not difficult at all      09/12/2022    2:13 PM  05/16/2022    1:54 PM 03/06/2022   11:30 AM 02/12/2022    2:06 PM  GAD 7 : Generalized Anxiety Score  Nervous, Anxious, on Edge 1 1 0 1  Control/stop worrying 0 1 0 1  Worry too much - different things 1 0 1 1  Trouble relaxing 0 1 0 0  Restless 0 0 0 0  Easily annoyed or irritable 1 1 1 1   Afraid - awful might happen 0 0 1 0  Total GAD 7 Score 3 4 3 4   Anxiety Difficulty Not difficult at all          08/12/2021    1:12 PM 08/12/2021    1:24 PM 02/12/2022    2:05 PM 03/06/2022   11:30 AM 09/12/2022    2:13 PM  Fall Risk  Falls in the past year? 0 1 1 1  0  Was there an injury with Fall? 0 0 1 1 0  Fall Risk Category Calculator 0 1 3 3  0  Fall Risk Category (Retired) Low Low High    (RETIRED) Patient Fall Risk Level Low fall risk Low fall risk Low fall risk    Patient at Risk for Falls Due to No Fall Risks History of fall(s) History of fall(s) No Fall Risks No Fall Risks  Fall risk Follow up Falls evaluation completed Falls evaluation completed Falls evaluation completed Falls evaluation completed Falls evaluation completed    Functional Status Survey:     Past Medical History:  Past Medical History:  Diagnosis Date   Anxiety    Asthma    Depression    GERD (gastroesophageal reflux disease)    Hyperlipidemia    Osteopenia    Thyroid disease     Surgical History:  Past Surgical History:  Procedure Laterality Date   COLONOSCOPY WITH PROPOFOL N/A 10/15/2017   Procedure: COLONOSCOPY WITH PROPOFOL;  Surgeon: Pasty Spillers, MD;  Location: [facility] ENDOSCOPY;  Service: Endoscopy;  Laterality: N/A;   ESOPHAGOGASTRODUODENOSCOPY (EGD) WITH PROPOFOL N/A 10/15/2017   Procedure: ESOPHAGOGASTRODUODENOSCOPY (EGD) WITH PROPOFOL;  Surgeon: Pasty Spillers, MD;  Location: [facility] ENDOSCOPY;  Service: Endoscopy;  Laterality: N/A;   TUBAL LIGATION      Medications:  Current Outpatient Medications on File Prior to Visit  Medication Sig   albuterol (PROAIR HFA) 108 (90 Base) MCG/ACT  inhaler Inhale 2 puffs into the lungs every 6 (six) hours as needed for wheezing or shortness of breath.   aspirin EC 81 MG tablet Take 1 tablet (81 mg total) by mouth daily. Swallow whole.   levothyroxine (SYNTHROID) 50 MCG tablet TAKE 1 TABLET(50 MCG) BY MOUTH DAILY   montelukast (SINGULAIR) 10 MG tablet TAKE 1 TABLET(10 MG) BY MOUTH AT BEDTIME   omeprazole (PRILOSEC) 20 MG capsule Take 1 capsule (20 mg total) by mouth daily.   rosuvastatin (CRESTOR) 40 MG tablet TAKE 1 TABLET(40  MG) BY MOUTH DAILY   No current facility-administered medications on file prior to visit.    Allergies:  No Known Allergies  Social History:  Social History   Socioeconomic History   Marital status: Married    Spouse name: Not on file   Number of children: Not on file   Years of education: Not on file   Highest education level: Not on file  Occupational History   Not on file  Tobacco Use   Smoking status: Never   Smokeless tobacco: Never  Vaping Use   Vaping status: Never Used  Substance and Sexual Activity   Alcohol use: No    Alcohol/week: 0.0 standard drinks of alcohol   Drug use: No   Sexual activity: Yes  Other Topics Concern   Not on file  Social History Narrative   Not on file   Social Determinants of Health   Financial Resource Strain: Low Risk  (07/04/2020)   Overall Financial Resource Strain (CARDIA)    Difficulty of Paying Living Expenses: Not hard at all  Food Insecurity: No Food Insecurity (07/04/2020)   Hunger Vital Sign    Worried About Running Out of Food in the Last Year: Never true    Ran Out of Food in the Last Year: Never true  Transportation Needs: No Transportation Needs (07/04/2020)   PRAPARE - Administrator, Civil Service (Medical): No    Lack of Transportation (Non-Medical): No  Physical Activity: Inactive (07/04/2020)   Exercise Vital Sign    Days of Exercise per Week: 0 days    Minutes of Exercise per Session: 0 min  Stress: No Stress Concern Present  (07/04/2020)   [employer] of Occupational Health - Occupational Stress Questionnaire    Feeling of Stress : Only a little  Social Connections: Moderately Integrated (07/04/2020)   Social Connection and Isolation Panel [NHANES]    Frequency of Communication with Friends and Family: More than three times a week    Frequency of Social Gatherings with Friends and Family: More than three times a week    Attends Religious Services: More than 4 times per year    Active Member of [employer] or Organizations: Yes    Attends Banker Meetings: Never    Marital Status: Divorced  Catering manager Violence: Not At Risk (07/04/2020)   Humiliation, Afraid, Rape, and Kick questionnaire    Fear of Current or Ex-Partner: No    Emotionally Abused: No    Physically Abused: No    Sexually Abused: No   Social History   Tobacco Use  Smoking Status Never  Smokeless Tobacco Never   Social History   Substance and Sexual Activity  Alcohol Use No   Alcohol/week: 0.0 standard drinks of alcohol    Family History:  Family History  Problem Relation Age of Onset   Hypertension Mother    Hyperlipidemia Mother    COPD Mother    Heart attack Father 9   Hyperlipidemia Brother    Diverticulitis Brother    Diverticulitis Brother    Diabetes Maternal Grandmother    Heart disease Maternal Grandfather    Cancer Paternal Grandmother        stomach   Stroke Paternal Grandfather    Heart disease Paternal Grandfather     Past medical history, surgical history, medications, allergies, family history and social history reviewed with patient today and changes made to appropriate areas of the chart.   Review of Systems - negative All other ROS  negative except what is listed above and in the HPI.      Objective:    BP 113/69   Pulse 89   Temp 98.8 F (37.1 C) (Oral)   Ht 4' 9.1" (1.45 m)   Wt 109 lb 6.4 oz (49.6 kg)   LMP 01/18/2015 (Approximate)   SpO2 98%   BMI 23.59 kg/m   Wt Readings  from Last 3 Encounters:  09/12/22 109 lb 6.4 oz (49.6 kg)  05/27/22 113 lb (51.3 kg)  05/16/22 111 lb 14.4 oz (50.8 kg)    Physical Exam Vitals and nursing note reviewed. Exam conducted with a chaperone present.  Constitutional:      General: She is awake. She is not in acute distress.    Appearance: She is well-developed and well-groomed. She is not ill-appearing or toxic-appearing.  HENT:     Head: Normocephalic and atraumatic.     Right Ear: Hearing, tympanic membrane, ear canal and external ear normal. No drainage.     Left Ear: Hearing, tympanic membrane, ear canal and external ear normal. No drainage.     Nose: Nose normal.     Right Sinus: No maxillary sinus tenderness or frontal sinus tenderness.     Left Sinus: No maxillary sinus tenderness or frontal sinus tenderness.     Mouth/Throat:     Mouth: Mucous membranes are moist.     Pharynx: Oropharynx is clear. Uvula midline. No pharyngeal swelling, oropharyngeal exudate or posterior oropharyngeal erythema.  Eyes:     General: Lids are normal.        Right eye: No discharge.        Left eye: No discharge.     Extraocular Movements: Extraocular movements intact.     Conjunctiva/sclera: Conjunctivae normal.     Pupils: Pupils are equal, round, and reactive to light.     Visual Fields: Right eye visual fields normal and left eye visual fields normal.  Neck:     Thyroid: No thyromegaly.     Vascular: No carotid bruit.     Trachea: Trachea normal.  Cardiovascular:     Rate and Rhythm: Normal rate and regular rhythm.     Heart sounds: Normal heart sounds. No murmur heard.    No gallop.  Pulmonary:     Effort: Pulmonary effort is normal. No accessory muscle usage or respiratory distress.     Breath sounds: Normal breath sounds.  Chest:  Breasts:    Right: Normal.     Left: Normal.  Abdominal:     General: Bowel sounds are normal.     Palpations: Abdomen is soft. There is no hepatomegaly or splenomegaly.     Tenderness:  There is no abdominal tenderness.  Musculoskeletal:        General: Normal range of motion.     Cervical back: Normal range of motion and neck supple.     Right lower leg: No edema.     Left lower leg: No edema.  Lymphadenopathy:     Head:     Right side of head: No submental, submandibular, tonsillar, preauricular or posterior auricular adenopathy.     Left side of head: No submental, submandibular, tonsillar, preauricular or posterior auricular adenopathy.     Cervical: No cervical adenopathy.     Upper Body:     Right upper body: No supraclavicular, axillary or pectoral adenopathy.     Left upper body: No supraclavicular, axillary or pectoral adenopathy.  Skin:    General: Skin is warm  and dry.     Capillary Refill: Capillary refill takes less than 2 seconds.     Findings: No rash.  Neurological:     Mental Status: She is alert and oriented to person, place, and time.     Gait: Gait is intact.     Deep Tendon Reflexes: Reflexes are normal and symmetric.     Reflex Scores:      Brachioradialis reflexes are 2+ on the right side and 2+ on the left side.      Patellar reflexes are 2+ on the right side and 2+ on the left side. Psychiatric:        Attention and Perception: Attention normal.        Mood and Affect: Mood normal.        Speech: Speech normal.        Behavior: Behavior normal. Behavior is cooperative.        Thought Content: Thought content normal.        Judgment: Judgment normal.    Results for orders placed or performed in visit on 05/16/22  Bayer DCA Hb A1c Waived  Result Value Ref Range   HB A1C (BAYER DCA - WAIVED) 6.3 (H) 4.8 - 5.6 %      Assessment & Plan:   Problem List Items Addressed This Visit       Cardiovascular and Mediastinum   LBBB (left bundle branch block)    Ongoing, continue collaboration with cardiology.  No recent changes made.        Respiratory   Allergic rhinitis    Chronic, ongoing in presence of asthma.  Continue daily  Singulair and adjust regimen as needed.      Asthma    Chronic, ongoing.  FEV1 77% and FEV1/FVC 64% (January 2024).  Continues to use Albuterol frequently, will trial a change to Breo inhaler to see if improvement in symptoms with this.  Discussed with patient.  Educated her on this.  If too costly could try Advair Diskus.  Continue Albuterol and Singulair as ordered.      Relevant Medications   fluticasone furoate-vilanterol (BREO ELLIPTA) 100-25 MCG/ACT AEPB   Other Relevant Orders   CBC with Differential/Platelet     Digestive   Feline esophagus    Chronic, stable.  Followed by GI, continue this collaboration.  Recommend she schedule follow-up with them.      Relevant Orders   Magnesium   GERD (gastroesophageal reflux disease)    Chronic, ongoing. Continue Prilosec and recommend she take this daily as ordered.  Recommend she follow-up with GI as was recommended. Mag level today.      Relevant Orders   Magnesium     Endocrine   Hypothyroidism - Primary    Chronic, ongoing.  Continue current medication regimen and adjust as needed.  Thyroid labs obtained today.      Relevant Orders   TSH   T4, free     Other   Elevated hemoglobin A1c measurement    A1c trend up to 6.5% in today (previous 6.3% and 6.6%), this places her in diabetes for second time.  Has family history of diabetes.  Will discuss with her initiation of Metformin 500 MG BID, if she agrees with plan of care to start then will order and have her return in 4 weeks for follow-up ad 3 months for A1c recheck.        Relevant Orders   Bayer DCA Hb A1c Waived   Hyperlipidemia  Chronic, ongoing.  Continue statin and adjust dose as needed. Lipid panel today.      Relevant Orders   Comprehensive metabolic panel   Lipid Panel w/o Chol/HDL Ratio   Vitamin D deficiency    Chronic, stable.  Recommend she start daily supplement and adjust as needed.  Vit D level today.      Relevant Orders   VITAMIN D 25 Hydroxy  (Vit-D Deficiency, Fractures)   Other Visit Diagnoses     Encounter for screening mammogram for malignant neoplasm of breast       Mammogram ordered and instructed on how to schedule.   Relevant Orders   MM 3D SCREENING MAMMOGRAM BILATERAL BREAST   Encounter for annual physical exam       Annual physical today with labs and health maintenance reviewed, discussed with patient.        Follow up plan: Return in about 3 months (around 12/13/2022) for A1c check.   LABORATORY TESTING:  - Pap smear: Up To Date  IMMUNIZATIONS:   - Tdap: Tetanus vaccination status reviewed: last tetanus booster within 10 years. - Influenza: Up to date - Pneumovax: Up To Date - Prevnar: Not applicable - HPV: Not applicable - Zostavax vaccine: Up To Date  SCREENING: -Mammogram: Ordered today and scheduled, last 08/24/20 - Colonoscopy: Up To Date with Cologuard -- due next 01/14/23 - Bone Density: Not applicable  -Hearing Test: Not applicable  -Spirometry: Not applicable   PATIENT COUNSELING:   Advised to take 1 mg of folate supplement per day if capable of pregnancy.   Sexuality: Discussed sexually transmitted diseases, partner selection, use of condoms, avoidance of unintended pregnancy  and contraceptive alternatives.   Advised to avoid cigarette smoking.  I discussed with the patient that most people either abstain from alcohol or drink within safe limits (<=14/week and <=4 drinks/occasion for males, <=7/weeks and <= 3 drinks/occasion for females) and that the risk for alcohol disorders and other health effects rises proportionally with the number of drinks per week and how often a drinker exceeds daily limits.  Discussed cessation/primary prevention of drug use and availability of treatment for abuse.   Diet: Encouraged to adjust caloric intake to maintain  or achieve ideal body weight, to reduce intake of dietary saturated fat and total fat, to limit sodium intake by avoiding high sodium foods  and not adding table salt, and to maintain adequate dietary potassium and calcium preferably from fresh fruits, vegetables, and low-fat dairy products.    Stressed the importance of regular exercise  Injury prevention: Discussed safety belts, safety helmets, smoke detector, smoking near bedding or upholstery.   Dental health: Discussed importance of regular tooth brushing, flossing, and dental visits.    NEXT PREVENTATIVE PHYSICAL DUE IN 1 YEAR. Return in about 3 months (around 12/13/2022) for A1c check.

## 2022-09-12 NOTE — Assessment & Plan Note (Signed)
Chronic, ongoing.  Continue statin and adjust dose as needed. Lipid panel today.

## 2022-09-12 NOTE — Assessment & Plan Note (Signed)
Chronic, ongoing in presence of asthma.  Continue daily Singulair and adjust regimen as needed. 

## 2022-09-12 NOTE — Progress Notes (Signed)
Already made her an appointment for 3 months while checking her out.

## 2022-09-12 NOTE — Assessment & Plan Note (Signed)
Chronic, ongoing. Continue Prilosec and recommend she take this daily as ordered.  Recommend she follow-up with GI as was recommended. Mag level today.

## 2022-09-12 NOTE — Assessment & Plan Note (Signed)
Chronic, stable.  Recommend she start daily supplement and adjust as needed.  Vit D level today. 

## 2022-09-12 NOTE — Assessment & Plan Note (Signed)
Ongoing, continue collaboration with cardiology.  No recent changes made.

## 2022-09-13 NOTE — Progress Notes (Signed)
Contacted via MyChart   Good morning Denise Proctor, your labs have returned and overall these are stable: - Kidney function, creatinine and eGFR, remains normal, as is liver function, AST and ALT.  - CBC with no anemia or infection. - Cholesterol levels at goal, continue Rosuvastatin. - Thyroid labs at goal, continue Levothyroxine dosing. - Vitamin D remains a little low, please ensure you are taking Vitamin D3 2000 units daily for bone health.  Any questions? Keep being amazing!!  Thank you for allowing me to participate in your care.  I appreciate you. Kindest regards, Caven Perine

## 2022-09-15 ENCOUNTER — Encounter: Payer: Self-pay | Admitting: Nurse Practitioner

## 2022-09-15 MED ORDER — METFORMIN HCL 500 MG PO TABS: 500.0000 mg | ORAL_TABLET | Freq: Two times a day (BID) | ORAL | 4 refills | Status: DC

## 2022-09-16 MED ORDER — METFORMIN HCL 500 MG PO TABS: 500.0000 mg | ORAL_TABLET | Freq: Two times a day (BID) | ORAL | 4 refills | Status: DC

## 2022-09-16 NOTE — Addendum Note (Signed)
Addended by: Aura Dials T on: 09/16/2022 12:08 PM   Modules accepted: Orders

## 2022-10-03 ENCOUNTER — Ambulatory Visit
Admission: RE | Admit: 2022-10-03 | Discharge: 2022-10-03 | Disposition: A | Discharge status: Home / Self Care | Payer: 59 | Source: Ambulatory Visit | Attending: Nurse Practitioner | Admitting: Nurse Practitioner

## 2022-10-03 DIAGNOSIS — Z1231 Encounter for screening mammogram for malignant neoplasm of breast: Secondary | ICD-10-CM | POA: Insufficient documentation

## 2022-10-07 NOTE — Progress Notes (Signed)
Contacted via MyChart   Normal mammogram, may repeat in one year:)

## 2022-10-08 ENCOUNTER — Encounter: Payer: Self-pay | Admitting: Nurse Practitioner

## 2022-10-08 ENCOUNTER — Other Ambulatory Visit: Payer: Self-pay | Admitting: Nurse Practitioner

## 2022-10-08 DIAGNOSIS — E039 Hypothyroidism, unspecified: Secondary | ICD-10-CM

## 2022-10-08 DIAGNOSIS — R911 Solitary pulmonary nodule: Secondary | ICD-10-CM | POA: Insufficient documentation

## 2022-10-17 ENCOUNTER — Ambulatory Visit
Admission: RE | Admit: 2022-10-17 | Discharge: 2022-10-17 | Disposition: A | Discharge status: Home / Self Care | Payer: 59 | Source: Ambulatory Visit | Attending: Nurse Practitioner | Admitting: Nurse Practitioner

## 2022-10-17 DIAGNOSIS — R911 Solitary pulmonary nodule: Secondary | ICD-10-CM | POA: Diagnosis present

## 2022-10-18 ENCOUNTER — Other Ambulatory Visit: Payer: Self-pay | Admitting: Nurse Practitioner

## 2022-10-20 MED ORDER — LEVOTHYROXINE SODIUM 50 MCG PO TABS
ORAL_TABLET | ORAL | 4 refills | Status: DC
Start: 2022-10-20 — End: 2023-11-04

## 2022-10-20 NOTE — Telephone Encounter (Signed)
Unable to refill per protocol, Rx expired. Discontinued 08/12/21.  Requested Prescriptions  Pending Prescriptions Disp Refills   budesonide-formoterol (SYMBICORT) 160-4.5 MCG/ACT inhaler [Pharmacy Med Name: SYMBICORT 160/4. (120 ORAL INH)] 30.6 g 4    Sig: INHALE 2 PUFFS BY MOUTH TWICE DAILY     Pulmonology:  Combination Products Passed - 10/18/2022  2:39 PM      Passed - Valid encounter within last 12 months    Recent Outpatient Visits           1 month ago Hypothyroidism, unspecified type   [place] [facility] [place], [place] T, NP   5 months ago Elevated hemoglobin A1c measurement   [place] [facility] [place], [place] T, NP   7 months ago Syncope and collapse   [place] [facility] [place], [place] T, NP   8 months ago Persistent asthma without complication, unspecified asthma severity   [place] [facility] [place], Corrie Dandy T, NP   1 year ago Mild persistent asthma without complication   [place] [facility] [place], Dorie Rank, NP       Future Appointments             In 1 month Cannady, Dorie Rank, NP [place] [facility]

## 2022-11-02 ENCOUNTER — Encounter: Payer: Self-pay | Admitting: Nurse Practitioner

## 2022-11-02 NOTE — Progress Notes (Signed)
Contacted via MyChart

## 2022-12-01 ENCOUNTER — Other Ambulatory Visit: Payer: Self-pay | Admitting: Nurse Practitioner

## 2022-12-02 NOTE — Telephone Encounter (Signed)
Requested by interface surescripts. Future visit in 1 week .  Requested Prescriptions  Pending Prescriptions Disp Refills   montelukast (SINGULAIR) 10 MG tablet [Pharmacy Med Name: MONTELUKAST 10MG  TABLETS] 90 tablet 0    Sig: TAKE 1 TABLET(10 MG) BY MOUTH AT BEDTIME     Pulmonology:  Leukotriene Inhibitors Passed - 12/01/2022  9:55 AM      Passed - Valid encounter within last 12 months    Recent Outpatient Visits           2 months ago Hypothyroidism, unspecified type   [place] [facility] [place], [place] T, NP   6 months ago Elevated hemoglobin A1c measurement   [place] [facility] [place], [place] T, NP   9 months ago Syncope and collapse   [place] [facility] [place], [place] T, NP   9 months ago Persistent asthma without complication, unspecified asthma severity   [place] [facility] [place], Corrie Dandy T, NP   1 year ago Mild persistent asthma without complication   [place] [facility] [place], Dorie Rank, NP       Future Appointments             In 1 week Cannady, Dorie Rank, NP [place] [facility]

## 2022-12-07 NOTE — Patient Instructions (Signed)
Be Involved in Caring For Your Health:  Taking Medications When medications are taken as directed, they can greatly improve your health. But if they are not taken as prescribed, they may not work. In some cases, not taking them correctly can be harmful. To help ensure your treatment remains effective and safe, understand your medications and how to take them. Bring your medications to each visit for review by your provider.  Your lab results, notes, and after visit summary will be available on My Chart. We strongly encourage you to use this feature. If lab results are abnormal the clinic will contact you with the appropriate steps. If the clinic does not contact you assume the results are satisfactory. You can always view your results on My Chart. If you have questions regarding your health or results, please contact the clinic during office hours. You can also ask questions on My Chart.  We at [facility] are grateful that you chose [place] to provide your care. We strive to provide evidence-based and compassionate care and are always looking for feedback. If you get a survey from the clinic please complete this so we can hear your opinions.  Diabetes Mellitus and Exercise Regular exercise is important for your health, especially if you have diabetes mellitus. Exercise is not just about losing weight. It can also help you increase muscle strength and bone density and reduce body fat and stress. This can help your level of endurance and make you more fit and flexible. Why should I exercise if I have diabetes? Exercise has many benefits for people with diabetes. It can: Help lower and control your blood sugar (glucose). Help your body respond better and become more sensitive to the hormone insulin. Reduce how much insulin your body needs. Lower your risk for heart disease by: Lowering how much "bad" cholesterol and triglycerides you have in your body. Increasing how much "good" cholesterol  you have in your body. Lowering your blood pressure. Lowering your blood glucose levels. What is my activity plan? Your health care provider or an expert trained in diabetes care (certified diabetes educator) can help you make an activity plan. This plan can help you find the type of exercise that works for you. It may also tell you how often to exercise and for how long. Be sure to: Get at least 150 minutes of medium-intensity or high-intensity exercise each week. This may involve brisk walking, biking, or water aerobics. Do stretching and strengthening exercises at least 2 times a week. This may involve yoga or weight lifting. Spread out your activity over at least 3 days of the week. Get some form of physical activity each day. Do not go more than 2 days in a row without some kind of activity. Avoid being inactive for more than 30 minutes at a time. Take frequent breaks to walk or stretch. Choose activities that you enjoy. Set goals that you know you can accomplish. Start slowly and increase the intensity of your exercise over time. How do I manage my diabetes during exercise?  Monitor your blood glucose Check your blood glucose before and after you exercise. If your blood glucose is 240 mg/dL (40.9 mmol/L) or higher before you exercise, check your urine for ketones. These are chemicals created by the liver. If you have ketones in your urine, do not exercise until your blood glucose returns to normal. If your blood glucose is 100 mg/dL (5.6 mmol/L) or lower, eat a snack that has 15-20 grams of carbohydrate in  it. Check your blood glucose 15 minutes after the snack to make sure that your level is above 100 mg/dL (5.6 mmol/L) before you start to exercise. Your risk for low blood glucose (hypoglycemia) goes up during and after exercise. Know the symptoms of this condition and how to treat it. Follow these instructions at home: Keep a carbohydrate snack on hand for use before, during, and after  exercise. This can help prevent or treat hypoglycemia. Avoid injecting insulin into parts of your body that are going to be used during exercise. This may include: Your arms, when you are going to play tennis. Your legs, when you are about to go jogging. Keep track of your exercise habits. This can help you and your health care provider watch and adjust your activity plan. Write down: What you eat before and after you exercise. Blood glucose levels before and after you exercise. The type and amount of exercise you do. Talk to your health care provider before you start a new activity. They may need to: Make sure that the activity is safe for you. Adjust your insulin, other medicines, and food that you eat. Drink water while you exercise. This can stop you from losing too much water (dehydration). It can also prevent problems caused by having a lot of heat in your body (heat stroke). Where to find more information American Diabetes Association: diabetes.org Association of Diabetes Care & Education Specialists: diabeteseducator.org This information is not intended to replace advice given to you by your health care provider. Make sure you discuss any questions you have with your health care provider. Document Revised: 07/10/2021 Document Reviewed: 07/10/2021 Elsevier Patient Education  2024 [employer].

## 2022-12-12 ENCOUNTER — Ambulatory Visit (INDEPENDENT_AMBULATORY_CARE_PROVIDER_SITE_OTHER): Payer: 59 | Admitting: Nurse Practitioner

## 2022-12-12 ENCOUNTER — Encounter: Payer: Self-pay | Admitting: Nurse Practitioner

## 2022-12-12 VITALS — BP 100/66 | HR 91 | Temp 98.4°F | Ht <= 58 in | Wt 112.8 lb

## 2022-12-12 DIAGNOSIS — E785 Hyperlipidemia, unspecified: Secondary | ICD-10-CM | POA: Diagnosis not present

## 2022-12-12 DIAGNOSIS — Z7984 Long term (current) use of oral hypoglycemic drugs: Secondary | ICD-10-CM

## 2022-12-12 DIAGNOSIS — E119 Type 2 diabetes mellitus without complications: Secondary | ICD-10-CM | POA: Diagnosis not present

## 2022-12-12 DIAGNOSIS — E1169 Type 2 diabetes mellitus with other specified complication: Secondary | ICD-10-CM

## 2022-12-12 LAB — BAYER DCA HB A1C WAIVED: HB A1C (BAYER DCA - WAIVED): 6 % — ABNORMAL HIGH (ref 4.8–5.6)

## 2022-12-12 MED ORDER — ONETOUCH VERIO W/DEVICE KIT: PACK | 0 refills | Status: AC

## 2022-12-12 MED ORDER — ONETOUCH ULTRASOFT LANCETS MISC: 12 refills | Status: DC

## 2022-12-12 MED ORDER — GLUCOSE BLOOD VI STRP: ORAL_STRIP | 12 refills | Status: DC

## 2022-12-12 NOTE — Progress Notes (Addendum)
BP 100/66   Pulse 91   Temp 98.4 F (36.9 C) (Oral)   Ht 4\' 10"  (1.473 m)   Wt 112 lb 12.8 oz (51.2 kg)   LMP 01/18/2015 (Approximate)   SpO2 98%   BMI 23.58 kg/m    Subjective:    Patient ID: Denise Proctor, female    DOB: 1960-07-13, 62 y.o.   MRN: [id number]  HPI: Denise Proctor is a 62 y.o. female  Chief Complaint  Patient presents with   Diabetes    Patient states she has not had a recent eye exam, planning to get one in January   Hyperlipidemia   DIABETES A1c 6.5% in August and we started Metformin, she is only taking once a day at this time.  Continues on Crestor. Hypoglycemic episodes:no Polydipsia/polyuria: no Visual disturbance: no Chest pain: no Paresthesias: no Glucose Monitoring: no  Accucheck frequency: Not Checking  Fasting glucose:  Post prandial:  Evening:  Before meals: Taking Insulin?: no  Long acting insulin:  Short acting insulin: Blood Pressure Monitoring: not checking Retinal Examination: Not up to Date Foot Exam: Up to Date Diabetic Education: Not Completed Pneumovax: Not up to Date Influenza: Up to Date Aspirin: yes   Relevant past medical, surgical, family and social history reviewed and updated as indicated. Interim medical history since our last visit reviewed. Allergies and medications reviewed and updated.  Review of Systems  Constitutional:  Negative for activity change, appetite change, diaphoresis, fatigue and fever.  Respiratory:  Negative for cough, chest tightness and shortness of breath.   Cardiovascular:  Negative for chest pain, palpitations and leg swelling.  Gastrointestinal: Negative.   Endocrine: Negative for cold intolerance, heat intolerance, polydipsia, polyphagia and polyuria.  Neurological: Negative.   Psychiatric/Behavioral: Negative.      Per HPI unless specifically indicated above     Objective:    BP 100/66   Pulse 91   Temp 98.4 F (36.9 C) (Oral)   Ht 4\' 10"  (1.473 m)   Wt 112 lb 12.8 oz (51.2  kg)   LMP 01/18/2015 (Approximate)   SpO2 98%   BMI 23.58 kg/m   Wt Readings from Last 3 Encounters:  12/12/22 112 lb 12.8 oz (51.2 kg)  09/12/22 109 lb 6.4 oz (49.6 kg)  05/27/22 113 lb (51.3 kg)    Physical Exam Vitals and nursing note reviewed.  Constitutional:      General: She is awake. She is not in acute distress.    Appearance: Normal appearance. She is well-developed and well-groomed. She is not ill-appearing or toxic-appearing.  HENT:     Head: Normocephalic.     Right Ear: Hearing and external ear normal.     Left Ear: Hearing and external ear normal.  Eyes:     General: Lids are normal.        Right eye: No discharge.        Left eye: No discharge.     Conjunctiva/sclera: Conjunctivae normal.     Pupils: Pupils are equal, round, and reactive to light.  Neck:     Thyroid: No thyromegaly.     Vascular: No carotid bruit.  Cardiovascular:     Rate and Rhythm: Normal rate and regular rhythm.     Heart sounds: Normal heart sounds. No murmur heard.    No gallop.  Pulmonary:     Effort: Pulmonary effort is normal. No accessory muscle usage or respiratory distress.     Breath sounds: Normal breath sounds.  Abdominal:  General: Bowel sounds are normal. There is no distension.     Palpations: Abdomen is soft.     Tenderness: There is no abdominal tenderness.  Musculoskeletal:     Cervical back: Normal range of motion and neck supple.     Right lower leg: No edema.     Left lower leg: No edema.  Lymphadenopathy:     Cervical: No cervical adenopathy.  Skin:    General: Skin is warm and dry.  Neurological:     Mental Status: She is alert and oriented to person, place, and time.     Deep Tendon Reflexes: Reflexes are normal and symmetric.     Reflex Scores:      Brachioradialis reflexes are 2+ on the right side and 2+ on the left side.      Patellar reflexes are 2+ on the right side and 2+ on the left side. Psychiatric:        Attention and Perception: Attention  normal.        Mood and Affect: Mood normal.        Speech: Speech normal.        Behavior: Behavior normal. Behavior is cooperative.        Thought Content: Thought content normal.    Diabetic Foot Exam - Simple   Simple Foot Form Visual Inspection No deformities, no ulcerations, no other skin breakdown bilaterally: Yes Sensation Testing Intact to touch and monofilament testing bilaterally: Yes Pulse Check Posterior Tibialis and Dorsalis pulse intact bilaterally: Yes Comments     Results for orders placed or performed in visit on 09/12/22  CBC with Differential/Platelet  Result Value Ref Range   WBC 6.8 3.4 - 10.8 x10E3/uL   RBC 4.34 3.77 - 5.28 x10E6/uL   Hemoglobin 12.9 11.1 - 15.9 g/dL   Hematocrit 16.1 09.6 - 46.6 %   MCV 90 79 - 97 fL   MCH 29.7 26.6 - 33.0 pg   MCHC 33.1 31.5 - 35.7 g/dL   RDW 04.5 40.9 - 81.1 %   Platelets 352 150 - 450 x10E3/uL   Neutrophils 51 Not Estab. %   Lymphs 40 Not Estab. %   Monocytes 7 Not Estab. %   Eos 1 Not Estab. %   Basos 1 Not Estab. %   Neutrophils Absolute 3.4 1.4 - 7.0 x10E3/uL   Lymphocytes Absolute 2.7 0.7 - 3.1 x10E3/uL   Monocytes Absolute 0.5 0.1 - 0.9 x10E3/uL   EOS (ABSOLUTE) 0.1 0.0 - 0.4 x10E3/uL   Basophils Absolute 0.0 0.0 - 0.2 x10E3/uL   Immature Granulocytes 0 Not Estab. %   Immature Grans (Abs) 0.0 0.0 - 0.1 x10E3/uL  Comprehensive metabolic panel  Result Value Ref Range   Glucose 96 70 - 99 mg/dL   BUN 7 (L) 8 - 27 mg/dL   Creatinine, Ser 9.14 0.57 - 1.00 mg/dL   eGFR 99 >78 GN/FAO/1.30   BUN/Creatinine Ratio 11 (L) 12 - 28   Sodium 140 134 - 144 mmol/L   Potassium 4.1 3.5 - 5.2 mmol/L   Chloride 103 96 - 106 mmol/L   CO2 21 20 - 29 mmol/L   Calcium 9.4 8.7 - 10.3 mg/dL   Total Protein 6.9 6.0 - 8.5 g/dL   Albumin 4.5 3.9 - 4.9 g/dL   Globulin, Total 2.4 1.5 - 4.5 g/dL   Bilirubin Total 0.8 0.0 - 1.2 mg/dL   Alkaline Phosphatase 42 (L) 44 - 121 IU/L   AST 19 0 - 40 IU/L  ALT 14 0 - 32 IU/L   Lipid Panel w/o Chol/HDL Ratio  Result Value Ref Range   Cholesterol, Total 139 100 - 199 mg/dL   Triglycerides 161 0 - 149 mg/dL   HDL 58 >09 mg/dL   VLDL Cholesterol Cal 19 5 - 40 mg/dL   LDL Chol Calc (NIH) 62 0 - 99 mg/dL  Bayer DCA Hb U0A Waived  Result Value Ref Range   HB A1C (BAYER DCA - WAIVED) 6.5 (H) 4.8 - 5.6 %  TSH  Result Value Ref Range   TSH 3.130 0.450 - 4.500 uIU/mL  T4, free  Result Value Ref Range   Free T4 1.44 0.82 - 1.77 ng/dL  Magnesium  Result Value Ref Range   Magnesium 2.3 1.6 - 2.3 mg/dL  VITAMIN D 25 Hydroxy (Vit-D Deficiency, Fractures)  Result Value Ref Range   Vit D, 25-Hydroxy 28.2 (L) 30.0 - 100.0 ng/mL      Assessment & Plan:   Problem List Items Addressed This Visit       Endocrine   Controlled type 2 diabetes mellitus without complication, without long-term current use of insulin (HCC) - Primary    Chronic, stable.  A1c 6% today, reduction from previous.  Recommend she try to go up to Metformin twice a day and if not tolerated may return to once a day.  Sent in blood glucose monitoring supplies and recommend she check at least one time weekly fasting and document.  Focus on diabetic diet and regular activity. - Vaccinations up to date - Foot exam up to date.  She is to schedule eye exam. - Statin on board.  No ACE or ARB at present as he BP runs on lower side and concern for hypotension with these even at low dose.      Relevant Orders   Bayer DCA Hb A1c Waived   Hyperlipidemia associated with type 2 diabetes mellitus (HCC)    Chronic, ongoing.  Continue statin and adjust dose as needed. Lipid panel up to date.      Relevant Orders   Bayer DCA Hb A1c Waived     Follow up plan: Return in about 3 months (around 03/14/2023) for T2DM, HLD.

## 2022-12-12 NOTE — Assessment & Plan Note (Addendum)
Chronic, ongoing.  Continue statin and adjust dose as needed. Lipid panel up to date.

## 2022-12-12 NOTE — Assessment & Plan Note (Signed)
Chronic, stable.  A1c 6% today, reduction from previous.  Recommend she try to go up to Metformin twice a day and if not tolerated may return to once a day.  Sent in blood glucose monitoring supplies and recommend she check at least one time weekly fasting and document.  Focus on diabetic diet and regular activity. - Vaccinations up to date - Foot exam up to date.  She is to schedule eye exam. - Statin on board.  No ACE or ARB at present as he BP runs on lower side and concern for hypotension with these even at low dose.

## 2022-12-15 ENCOUNTER — Other Ambulatory Visit: Payer: Self-pay

## 2022-12-15 MED ORDER — ONETOUCH ULTRASOFT LANCETS MISC: 1.0000 | Freq: Two times a day (BID) | 6 refills | Status: AC

## 2023-02-02 ENCOUNTER — Emergency Department
Admission: EM | Admit: 2023-02-02 | Discharge: 2023-02-02 | Disposition: A | Discharge status: Home / Self Care | Payer: 59 | Attending: Emergency Medicine | Admitting: Emergency Medicine

## 2023-02-02 ENCOUNTER — Other Ambulatory Visit: Payer: Self-pay

## 2023-02-02 ENCOUNTER — Emergency Department: Payer: 59

## 2023-02-02 DIAGNOSIS — R55 Syncope and collapse: Secondary | ICD-10-CM | POA: Diagnosis present

## 2023-02-02 DIAGNOSIS — Z5321 Procedure and treatment not carried out due to patient leaving prior to being seen by health care provider: Secondary | ICD-10-CM | POA: Insufficient documentation

## 2023-02-02 LAB — CBC WITH DIFFERENTIAL/PLATELET
Abs Immature Granulocytes: 0.06 10*3/uL (ref 0.00–0.07)
Basophils Absolute: 0 10*3/uL (ref 0.0–0.1)
Basophils Relative: 0 %
Eosinophils Absolute: 0 10*3/uL (ref 0.0–0.5)
Eosinophils Relative: 0 %
HCT: 38.8 % (ref 36.0–46.0)
Hemoglobin: 12.8 g/dL (ref 12.0–15.0)
Immature Granulocytes: 0 %
Lymphocytes Relative: 11 %
Lymphs Abs: 1.5 10*3/uL (ref 0.7–4.0)
MCH: 30.1 pg (ref 26.0–34.0)
MCHC: 33 g/dL (ref 30.0–36.0)
MCV: 91.3 fL (ref 80.0–100.0)
Monocytes Absolute: 0.8 10*3/uL (ref 0.1–1.0)
Monocytes Relative: 6 %
Neutro Abs: 11.1 10*3/uL — ABNORMAL HIGH (ref 1.7–7.7)
Neutrophils Relative %: 83 %
Platelets: 370 10*3/uL (ref 150–400)
RBC: 4.25 MIL/uL (ref 3.87–5.11)
RDW: 13.2 % (ref 11.5–15.5)
WBC: 13.5 10*3/uL — ABNORMAL HIGH (ref 4.0–10.5)
nRBC: 0 % (ref 0.0–0.2)

## 2023-02-02 LAB — COMPREHENSIVE METABOLIC PANEL
ALT: 17 U/L (ref 0–44)
AST: 21 U/L (ref 15–41)
Albumin: 4 g/dL (ref 3.5–5.0)
Alkaline Phosphatase: 40 U/L (ref 38–126)
Anion gap: 11 (ref 5–15)
BUN: 9 mg/dL (ref 8–23)
CO2: 20 mmol/L — ABNORMAL LOW (ref 22–32)
Calcium: 9 mg/dL (ref 8.9–10.3)
Chloride: 107 mmol/L (ref 98–111)
Creatinine, Ser: 0.76 mg/dL (ref 0.44–1.00)
GFR, Estimated: >60 mL/min (ref >60)
Glucose, Bld: 127 mg/dL — ABNORMAL HIGH (ref 70–99)
Potassium: 3.4 mmol/L — ABNORMAL LOW (ref 3.5–5.1)
Sodium: 138 mmol/L (ref 135–145)
Total Bilirubin: 0.8 mg/dL (ref 0.0–1.2)
Total Protein: 7.4 g/dL (ref 6.5–8.1)

## 2023-02-02 LAB — T4, FREE: Free T4: 1.1 ng/dL (ref 0.61–1.12)

## 2023-02-02 LAB — TROPONIN I (HIGH SENSITIVITY): Troponin I (High Sensitivity): 7 ng/L (ref <18)

## 2023-02-02 LAB — TSH: TSH: 5.172 u[IU]/mL — ABNORMAL HIGH (ref 0.350–4.500)

## 2023-02-02 NOTE — ED Notes (Signed)
20GA SL removed from left hand at pt request; cannula intact, dsng applied

## 2023-02-02 NOTE — ED Triage Notes (Addendum)
Pt reports she had a syncopal episode while in the car at the parking lot of the store. Pt aroused and was confused about where she was. She drove home and called EMS to bring her to the ER. Pt says she feels normal and back to her baseline at this time. Pt has had ongoing workup throughout the year collaborating with cardiology and neurology.

## 2023-02-05 ENCOUNTER — Telehealth: Payer: Self-pay | Admitting: Cardiology

## 2023-02-05 ENCOUNTER — Encounter: Payer: Self-pay | Admitting: Nurse Practitioner

## 2023-02-05 NOTE — Telephone Encounter (Signed)
 Patient requested appt, called and left voicemail to schedule appt.

## 2023-02-13 ENCOUNTER — Other Ambulatory Visit: Payer: Self-pay | Admitting: Nurse Practitioner

## 2023-02-16 NOTE — Telephone Encounter (Signed)
 Requested Prescriptions  Pending Prescriptions Disp Refills   montelukast  (SINGULAIR ) 10 MG tablet [Pharmacy Med Name: MONTELUKAST  10MG  TABLETS] 90 tablet 1    Sig: TAKE 1 TABLET(10 MG) BY MOUTH AT BEDTIME     Pulmonology:  Leukotriene Inhibitors Passed - 02/16/2023  1:46 PM      Passed - Valid encounter within last 12 months    Recent Outpatient Visits           2 months ago Controlled type 2 diabetes mellitus without complication, without long-term current use of insulin (HCC)   [facility] [place], [place] T, NP   5 months ago Hypothyroidism, unspecified type   [facility] [place], Malta T, NP   9 months ago Elevated hemoglobin A1c measurement   [facility] [place], Ramsey T, NP   11 months ago Syncope and collapse   [facility] [place], McCleary T, NP   1 year ago Persistent asthma without complication, unspecified asthma severity   [place] [facility] [place], Melanie DASEN, NP       Future Appointments             In 1 week Hamm[place], Tylene, NP [facility] [place]   In 4 weeks [place], Melanie DASEN, NP  [facility]

## 2023-02-24 ENCOUNTER — Other Ambulatory Visit (INDEPENDENT_AMBULATORY_CARE_PROVIDER_SITE_OTHER): Payer: 59

## 2023-02-24 ENCOUNTER — Ambulatory Visit: Payer: 59 | Attending: Cardiology | Admitting: Cardiology

## 2023-02-24 ENCOUNTER — Encounter: Payer: Self-pay | Admitting: Cardiology

## 2023-02-24 VITALS — BP 134/87 | HR 80 | Ht <= 58 in | Wt 114.4 lb

## 2023-02-24 DIAGNOSIS — E039 Hypothyroidism, unspecified: Secondary | ICD-10-CM | POA: Diagnosis not present

## 2023-02-24 DIAGNOSIS — E782 Mixed hyperlipidemia: Secondary | ICD-10-CM

## 2023-02-24 DIAGNOSIS — I447 Left bundle-branch block, unspecified: Secondary | ICD-10-CM

## 2023-02-24 DIAGNOSIS — R55 Syncope and collapse: Secondary | ICD-10-CM | POA: Diagnosis not present

## 2023-02-24 DIAGNOSIS — E119 Type 2 diabetes mellitus without complications: Secondary | ICD-10-CM

## 2023-02-24 NOTE — Patient Instructions (Addendum)
Medication Instructions:  The current medical regimen is effective;  continue present plan and medications.  *If you need a refill on your cardiac medications before your next appointment, please call your pharmacy*   Testing/Procedures:  ZIO AT Long term monitor-Live Telemetry  Your physician has requested you wear a ZIO patch monitor for 14 days.  This is a single patch monitor. [employer] supplies one patch monitor per enrollment. Additional  stickers are not available.   Please do not apply patch if you will be having a Nuclear Stress Test, Echocardiogram, Cardiac CT, MRI,  or Chest Xray during the period you would be wearing the monitor. The patch cannot be worn during  these tests. You cannot remove and re-apply the ZIO AT patch monitor.  Your ZIO patch monitor will be mailed 3 day USPS to your address on file. It may take 3-5 days to  receive your monitor after you have been enrolled.  Once you have received your monitor, please review the enclosed instructions. Your monitor has  already been registered assigning a specific monitor serial # to you.   Billing and Patient Assistance Program information  Meredeth Ide has been supplied with any insurance information on record for billing. [employer] offers a sliding scale Patient Assistance Program for patients without insurance, or whose  insurance does not completely cover the cost of the ZIO patch monitor. You must apply for the  Patient Assistance Program to qualify for the discounted rate. To apply, call [employer] at [phone],  select option 4, select option 2 , ask to apply for the Patient Assistance Program, (you can request an  interpreter if needed). [employer] will ask your household income and how many people are in your  household. [employer] will quote your out-of-pocket cost based on this information. They will also be able  to set up a 12 month interest free payment plan if needed.  Applying the monitor   Shave hair from upper  left chest.  Hold the abrader disc by orange tab. Rub the abrader in 40 strokes over left upper chest as indicated in  your monitor instructions.  Clean area with 4 enclosed alcohol pads. Use all pads to ensure the area is cleaned thoroughly. Let  dry.  Apply patch as indicated in monitor instructions. Patch will be placed under collarbone on left side of  chest with arrow pointing upward.  Rub patch adhesive wings for 2 minutes. Remove the white label marked "1". Remove the white label  marked "2". Rub patch adhesive wings for 2 additional minutes.  While looking in a mirror, press and release button in center of patch. A small green light will flash 3-4  times. This will be your only indicator that the monitor has been turned on.  Do not shower for the first 24 hours. You may shower after the first 24 hours.  Press the button if you feel a symptom. You will hear a small click. Record Date, Time and Symptom in  the Patient Log.   Starting the Gateway  In your kit there is a Audiological scientist box the size of a cellphone. This is Buyer, retail. It transmits all your  recorded data to [facility] This box must always stay within 10 feet of you. Open the box and push the *  button. There will be a light that blinks orange and then green a few times. When the light stops  blinking, the Gateway is connected to the ZIO patch. Call [employer] at [phone] to confirm your  monitor is transmitting.  Returning your monitor  Remove your patch and place it inside the Gateway. In the lower half of the Gateway there is a white  bag with prepaid postage on it. Place Gateway in bag and seal. Mail package back to [facility] as soon as  possible. Your physician should have your final report approximately 7 days after you have mailed back  your monitor. Call [facility] Customer Care at [phone] if you have questions regarding your ZIO AT  patch monitor. Call them immediately if you see an orange light  blinking on your monitor.  If your monitor falls off in less than 4 days, contact our Monitor department at [phone]. If your  monitor becomes loose or falls off after 4 days call [employer] at [phone] for suggestions on  securing your monitor    Follow-Up: At [facility], you and your health needs are our priority.  As part of our continuing mission to provide you with exceptional heart care, we have created designated Provider Care Teams.  These Care Teams include your primary Cardiologist (physician) and Advanced Practice Providers (APPs -  Physician Assistants and Nurse Practitioners) who all work together to provide you with the care you need, when you need it.  We recommend signing up for the patient portal called "MyChart".  Sign up information is provided on this After Visit Summary.  MyChart is used to connect with patients for Virtual Visits (Telemedicine).  Patients are able to view lab/test results, encounter notes, upcoming appointments, etc.  Non-urgent messages can be sent to your provider as well.   To learn more about what you can do with MyChart, go to [email or web address].    Your next appointment:   6 week(s)  Provider:   Charlsie Quest, NP    Other Instructions Referral to EP (Dr.Parker) we will be in touch with an appointment.

## 2023-02-24 NOTE — Progress Notes (Signed)
Cardiology Office Note:  .   Date:  02/24/2023  ID:  Denise Proctor, DOB [birth year], MRN [id number] PCP: Marjie Skiff, NP  [facility] Providers Cardiologist:  None    History of Present Illness: .   Denise Proctor is a 63 y.o. female with a past medical history of hyperlipidemia, prediabetes, syncope with collapse, is here today for follow-up after recent visit to the emergency department for an additional episode of syncope with collapse.   She previously been evaluated in the office on 10/16/2021 by Dr. Okey Dupre for recent syncopal episodes.  They have started happening since May 2023.  She states she was likely unconscious for approximately 30 seconds.  Her evaluation in the emergency department was unremarkable and stated that her primary care provider thought that there may be a vascular issue in her neck that was causing episodes.  Coronary CTA was completed in 10/25/2021 which revealed calcium score of 0.  Carotid duplex was completed 11/19/2021 with normal results.  Echocardiogram revealed normal LVEF, no regional wall motion abnormalities, G2 DD, and trivial mitral regurgitation.    She was last seen in clinic 05/27/2022 where she been doing fairly well.  She continue to work with her PCP on her prediabetes.  She had not had any further syncopal episodes since early January.  She denied chest pain, palpitations, shortness of breath.  She was advised that she continued syncopal episode she would likely need further evaluation by EP.  She was also encouraged to continue to work on her prediabetes status and follow-up with neurology.   She presented to the [facility] emergency department 02/02/2023 reports she had a syncopal episode while in the car at the parking lot of a store.  She aroused was confused about where she was.  She drove home and called EMS to bring her to the emergency department.  She stated that she felt back to her baseline at that time.  She advised emergency department staff  she had ongoing workup throughout the year collaborating with cardiology and neurology.  Unfortunately she left without being seen.  She returns to clinic today stating that overall she has been feeling well but she is concerned after having recurrent syncopal episodes.  She stated she was sitting upright in the car when she awoke at she was slumped over in the seated.  There were no symptoms leading up to that she had been in her normal state of health prior to the event.  In the emergency department along she said there the morning that everything returned back to normal so that is what she had left to the emergency department she did have questions today potentially could be that her blood pressure was troponin on her sugar is dropping however there was not any noted seizure activity before neurology previously when she had followed up did not have any findings.  She stated that the neurologist did not want to do an MRI of her brain which she had declined since all other testing had been okay.  It is that she has been compliant with her current medication regimen has not had any side effects.  Denies any chest pain, shortness of breath, palpitations, peripheral edema, lightheadedness or dizziness.    ROS: 10 point review of systems has been reviewed and considered negative except what is been listed in the HPI  Studies Reviewed: .        Heart Monitor 12/24/21   Two separate monitors were worn for a total  enrollment period of 12 days, 22 hours.   The predominant rhythm was sinus with underlying intraventricular conduction delay.  The average rate was 79 bpm (range 47 - 141 bpm).   There were rare PAC's and PVC's.   No sustained arrhythmia or prolonged pause was observed.   Patient triggered events correspond to normal sinus rhythm, sinus tachycardia, and artifact.   Predominantly sinus rhythm with rare PAC's and PVC's.  No significant arrhythmia identified.   TTE 11/19/21 1. Left ventricular  ejection fraction, by estimation, is 60 to 65%. The  left ventricle has normal function. The left ventricle has no regional  wall motion abnormalities. Left ventricular diastolic parameters are  consistent with Grade II diastolic  dysfunction (pseudonormalization).   2. Right ventricular systolic function is normal. The right ventricular  size is normal. Tricuspid regurgitation signal is inadequate for assessing  PA pressure.   3. The mitral valve is normal in structure. Trivial mitral valve  regurgitation. No evidence of mitral stenosis.   4. The aortic valve is tricuspid. Aortic valve regurgitation is not  visualized. No aortic stenosis is present.   5. The inferior vena cava is normal in size with greater than 50%  respiratory variability, suggesting right atrial pressure of 3 mmHg.  Risk Assessment/Calculations:             Physical Exam:   VS:  BP 134/87   Pulse 80   Ht 4\' 10"  (1.473 m)   Wt 114 lb 6.4 oz (51.9 kg)   LMP 01/18/2015 (Approximate)   SpO2 98%   BMI 23.91 kg/m    Wt Readings from Last 3 Encounters:  02/24/23 114 lb 6.4 oz (51.9 kg)  12/12/22 112 lb 12.8 oz (51.2 kg)  09/12/22 109 lb 6.4 oz (49.6 kg)    GEN: Well nourished, well developed in no acute distress NECK: No JVD; No carotid bruits CARDIAC: RRR, no murmurs, rubs, gallops RESPIRATORY:  Clear to auscultation without rales, wheezing or rhonchi  ABDOMEN: Soft, non-tender, non-distended EXTREMITIES:  No edema; No deformity   ASSESSMENT AND PLAN: .   Recent syncopal event with collapse.  Prior workup with coronary CTA revealed a calcium score of 0 and a ZIO AT monitor that was unrevealing.  She stated that her workup head and neurology was unrevealing as well.  Patient states that she was only out for approximately a minute as far she can tell.  She was sitting upright in her car in a parking lot at a store with converted to head home and woke up laying over in the passenger seat.  At that time she had  called for brother who was not home and they called her boyfriend to come and pick her up.  She continued not to feel well when they arrived at home so EMS was called and she was brought to the emergency department for further workup.  EKG revealed in the emergency department revealed sinus tachycardia with a rate of 102 with a chronic left bundle branch block.  For her syncopal episode she has been placed back on a ZIO AT monitor to see if there are any other changes that have been noted and she is to be referred to EP for further evaluation we did discuss loop recorder with the patient is not a fan of at this time.  We did advise her that EP would offer more options related to her syncope than what we got on the general cardiology side.  She is agreeable to  referral to EP.  Mixed hyperlipidemia with last LDL of 62 09/2022.  She is continued on rosuvastatin 20 mg daily.  Hypothyroidism we will last TSH 5.3 she is continued on levothyroxine.  This is managed by her PCP.  Type 2 diabetes on hemoglobin A1c of 6.  She is continued on metformin twice daily.  This continues to be managed by CBC.       Dispo: Patient to return to clinic to see MD/APP in 6 weeks or sooner if needed  Signed, Monserrat Vidaurri, NP

## 2023-03-15 DIAGNOSIS — E119 Type 2 diabetes mellitus without complications: Secondary | ICD-10-CM | POA: Insufficient documentation

## 2023-03-16 ENCOUNTER — Ambulatory Visit: Payer: Self-pay | Admitting: Nurse Practitioner

## 2023-03-16 DIAGNOSIS — Z1211 Encounter for screening for malignant neoplasm of colon: Secondary | ICD-10-CM

## 2023-03-16 DIAGNOSIS — E1169 Type 2 diabetes mellitus with other specified complication: Secondary | ICD-10-CM

## 2023-03-16 DIAGNOSIS — E119 Type 2 diabetes mellitus without complications: Secondary | ICD-10-CM

## 2023-03-16 DIAGNOSIS — J4530 Mild persistent asthma, uncomplicated: Secondary | ICD-10-CM

## 2023-03-16 DIAGNOSIS — I447 Left bundle-branch block, unspecified: Secondary | ICD-10-CM

## 2023-03-16 DIAGNOSIS — E039 Hypothyroidism, unspecified: Secondary | ICD-10-CM

## 2023-03-16 DIAGNOSIS — R911 Solitary pulmonary nodule: Secondary | ICD-10-CM

## 2023-03-22 NOTE — Patient Instructions (Signed)
 Be Involved in Caring For Your Health:  Taking Medications When medications are taken as directed, they can greatly improve your health. But if they are not taken as prescribed, they may not work. In some cases, not taking them correctly can be harmful. To help ensure your treatment remains effective and safe, understand your medications and how to take them. Bring your medications to each visit for review by your provider.  Your lab results, notes, and after visit summary will be available on My Chart. We strongly encourage you to use this feature. If lab results are abnormal the clinic will contact you with the appropriate steps. If the clinic does not contact you assume the results are satisfactory. You can always view your results on My Chart. If you have questions regarding your health or results, please contact the clinic during office hours. You can also ask questions on My Chart.  We at [facility] are grateful that you chose [place] to provide your care. We strive to provide evidence-based and compassionate care and are always looking for feedback. If you get a survey from the clinic please complete this so we can hear your opinions.  Diabetes Mellitus and Foot Care Diabetes, also called diabetes mellitus, may cause problems with your feet and legs because of poor blood flow (circulation). Poor circulation may make your skin: Become thinner and drier. Break more easily. Heal more slowly. Peel and crack. You may also have nerve damage (neuropathy). This can cause decreased feeling in your legs and feet. This means that you may not notice minor injuries to your feet that could lead to more serious problems. Finding and treating problems early is the best way to prevent future foot problems. How to care for your feet Foot hygiene  Wash your feet daily with warm water and mild soap. Do not use hot water. Then, pat your feet and the areas between your toes until they are fully dry. Do  not soak your feet. This can dry your skin. Trim your toenails straight across. Do not dig under them or around the cuticle. File the edges of your nails with an emery board or nail file. Apply a moisturizing lotion or petroleum jelly to the skin on your feet and to dry, brittle toenails. Use lotion that does not contain alcohol and is unscented. Do not apply lotion between your toes. Shoes and socks Wear clean socks or stockings every day. Make sure they are not too tight. Do not wear knee-high stockings. These may decrease blood flow to your legs. Wear shoes that fit well and have enough cushioning. Always look in your shoes before you put them on to be sure there are no objects inside. To break in new shoes, wear them for just a few hours a day. This prevents injuries on your feet. Wounds, scrapes, corns, and calluses  Check your feet daily for blisters, cuts, bruises, sores, and redness. If you cannot see the bottom of your feet, use a mirror or ask someone for help. Do not cut off corns or calluses or try to remove them with medicine. If you find a minor scrape, cut, or break in the skin on your feet, keep it and the skin around it clean and dry. You may clean these areas with mild soap and water. Do not clean the area with peroxide, alcohol, or iodine. If you have a wound, scrape, corn, or callus on your foot, look at it several times a day to make sure it  is healing and not infected. Check for: Redness, swelling, or pain. Fluid or blood. Warmth. Pus or a bad smell. General tips Do not cross your legs. This may decrease blood flow to your feet. Do not use heating pads or hot water bottles on your feet. They may burn your skin. If you have lost feeling in your feet or legs, you may not know this is happening until it is too late. Protect your feet from hot and cold by wearing shoes, such as at the beach or on hot pavement. Schedule a complete foot exam at least once a year or more often if  you have foot problems. Report any cuts, sores, or bruises to your health care provider right away. Where to find more information American Diabetes Association: diabetes.org Association of Diabetes Care & Education Specialists: diabeteseducator.org Contact a health care provider if: You have a condition that increases your risk of infection, and you have any cuts, sores, or bruises on your feet. You have an injury that is not healing. You have redness on your legs or feet. You feel burning or tingling in your legs or feet. You have pain or cramps in your legs and feet. Your legs or feet are numb. Your feet always feel cold. You have pain around any toenails. Get help right away if: You have a wound, scrape, corn, or callus on your foot and: You have signs of infection. You have a fever. You have a red line going up your leg. This information is not intended to replace advice given to you by your health care provider. Make sure you discuss any questions you have with your health care provider. Document Revised: 07/24/2021 Document Reviewed: 07/24/2021 Elsevier Patient Education  2024 [employer].

## 2023-03-26 DIAGNOSIS — R55 Syncope and collapse: Secondary | ICD-10-CM | POA: Diagnosis not present

## 2023-03-26 NOTE — Progress Notes (Signed)
 Average heart rate was noted to be 80 bpm.  Left bundle branch block was prominent.  Rare early beats called PACs or PVCs.  1 single fastest episode lasting 6 beats up to 122 bpm.  There was no arrhythmia or prolonged pauses that were noted.  Recommend keeping upcoming follow-up with Dr. Jimmey Ralph from EP.

## 2023-03-27 ENCOUNTER — Encounter: Payer: Self-pay | Admitting: Nurse Practitioner

## 2023-03-27 ENCOUNTER — Ambulatory Visit: Payer: 59 | Admitting: Nurse Practitioner

## 2023-03-27 VITALS — BP 123/77 | HR 87 | Temp 98.7°F | Ht <= 58 in | Wt 114.4 lb

## 2023-03-27 DIAGNOSIS — E039 Hypothyroidism, unspecified: Secondary | ICD-10-CM

## 2023-03-27 DIAGNOSIS — J45909 Unspecified asthma, uncomplicated: Secondary | ICD-10-CM

## 2023-03-27 DIAGNOSIS — Z1211 Encounter for screening for malignant neoplasm of colon: Secondary | ICD-10-CM

## 2023-03-27 DIAGNOSIS — E119 Type 2 diabetes mellitus without complications: Secondary | ICD-10-CM

## 2023-03-27 DIAGNOSIS — E785 Hyperlipidemia, unspecified: Secondary | ICD-10-CM

## 2023-03-27 DIAGNOSIS — Z7984 Long term (current) use of oral hypoglycemic drugs: Secondary | ICD-10-CM

## 2023-03-27 DIAGNOSIS — E1169 Type 2 diabetes mellitus with other specified complication: Secondary | ICD-10-CM

## 2023-03-27 DIAGNOSIS — E559 Vitamin D deficiency, unspecified: Secondary | ICD-10-CM

## 2023-03-27 MED ORDER — ROSUVASTATIN CALCIUM 40 MG PO TABS: 40.0000 mg | ORAL_TABLET | Freq: Every day | ORAL | 4 refills | Status: DC

## 2023-03-27 MED ORDER — FLUTICASONE FUROATE-VILANTEROL 100-25 MCG/ACT IN AEPB: 1.0000 | INHALATION_SPRAY | Freq: Every day | RESPIRATORY_TRACT | 4 refills | Status: DC

## 2023-03-27 NOTE — Assessment & Plan Note (Signed)
 Chronic, ongoing.  Continue current medication regimen and adjust as needed. Thyroid labs obtained today.

## 2023-03-27 NOTE — Assessment & Plan Note (Signed)
Chronic, ongoing.  Continue statin and adjust dose as needed. Lipid panel today.

## 2023-03-27 NOTE — Assessment & Plan Note (Signed)
 Refer to diabetes without complication plan of care.

## 2023-03-27 NOTE — Addendum Note (Signed)
 Addended by: Marjie Skiff on: 03/27/2023 04:16 PM   Modules accepted: Orders

## 2023-03-27 NOTE — Assessment & Plan Note (Signed)
 Chronic, stable.  A1c 6% last check, will recheck today. Continue Metformin and adjust as needed.  Continue to check BS twice a day. Focus on diabetic diet and regular activity. - Vaccinations up to date - Foot exam up to date.  She is to schedule eye exam. - Statin on board.  No ACE or ARB at present as he BP runs on lower side and concern for hypotension with these even at low dose + syncopal episodes at baseline.

## 2023-03-27 NOTE — Progress Notes (Signed)
 BP 123/77   Pulse 87   Temp 98.7 F (37.1 C) (Oral)   Ht 4\' 10"  (1.473 m)   Wt 114 lb 6.4 oz (51.9 kg)   LMP 01/18/2015 (Approximate)   SpO2 98%   BMI 23.91 kg/m    Subjective:    Patient ID: Denise Proctor, female    DOB: [birth year], 63 y.o.   MRN: [id number]  HPI: Denise Proctor is a 63 y.o. female  Chief Complaint  Patient presents with   Diabetes    No recent eye exam per patient    Hyperlipidemia   DIABETES Taking Metformin 500 MG daily.  Started on 09/12/22 after she had two separate A1c readings of 6.5%.  Last A1c November was 6%. Hypoglycemic episodes:no Polydipsia/polyuria: no Visual disturbance: no Chest pain: no Paresthesias: no Glucose Monitoring: yes  Accucheck frequency: BID  Fasting glucose: 130 to 140 range  Post prandial:  Evening: 140 range  Before meals: Taking Insulin?: no  Long acting insulin:  Short acting insulin: Blood Pressure Monitoring: not checking Retinal Examination: Not up to Date Foot Exam: Up to Date Diabetic Education: Not Completed Pneumovax: Up to Date Influenza: Up to Date Aspirin: yes   HYPERLIPIDEMIA Taking Rosuvastatin daily.  Last saw cardiology 02/24/23 - had Zio monitor recently. Hyperlipidemia status: good compliance Satisfied with current treatment?  yes Side effects:  no Medication compliance: good compliance Supplements: none Aspirin:  yes The 10-year ASCVD risk score (Arnett DK, et al., 2019) is: 7%   Values used to calculate the score:     Age: 70 years     Sex: Female     Is Non-Hispanic African American: No     Diabetic: Yes     Tobacco smoker: No     Systolic Blood Pressure: 123 mmHg     Is BP treated: No     HDL Cholesterol: 58 mg/dL     Total Cholesterol: 139 mg/dL Chest pain:  no Coronary artery disease:  no Family history CAD:  yes Family history early CAD:  no   HYPOTHYROIDISM Taking Levothyroxine 50 MCG daily. Thyroid control status:stable Satisfied with current treatment? yes Medication  side effects: no Medication compliance: good compliance Etiology of hypothyroidism: unknown Recent dose adjustment:no Fatigue: yes Cold intolerance: always cold Heat intolerance: no Weight gain: no Weight loss: no Constipation: yes Diarrhea/loose stools: no Palpitations: no Lower extremity edema: no Anxiety/depressed mood: no    03/27/2023    2:06 PM 12/12/2022    1:49 PM 09/12/2022    2:13 PM 05/16/2022    1:54 PM 03/06/2022   11:30 AM  Depression screen PHQ 2/9  Decreased Interest 1 1 1 1  0  Down, Depressed, Hopeless 0 0 1 0 0  PHQ - 2 Score 1 1 2 1  0  Altered sleeping 1 1 0 0 1  Tired, decreased energy 2 1 1 1  0  Change in appetite 0 0 0 0 0  Feeling bad or failure about yourself  0 0 0 0 0  Trouble concentrating 0 1 0 1 0  Moving slowly or fidgety/restless 1 0 0 1 0  Suicidal thoughts 0 0 0 0 0  PHQ-9 Score 5 4 3 4 1   Difficult doing work/chores Not difficult at all Not difficult at all Not difficult at all  Not difficult at all       03/27/2023    2:06 PM 12/12/2022    1:49 PM 09/12/2022    2:13 PM 05/16/2022  1:54 PM  GAD 7 : Generalized Anxiety Score  Nervous, Anxious, on Edge 1 1 1 1   Control/stop worrying 0 1 0 1  Worry too much - different things 1 1 1  0  Trouble relaxing 0 0 0 1  Restless 0 0 0 0  Easily annoyed or irritable 2 1 1 1   Afraid - awful might happen 0 1 0 0  Total GAD 7 Score 4 5 3 4   Anxiety Difficulty Not difficult at all Not difficult at all Not difficult at all    Relevant past medical, surgical, family and social history reviewed and updated as indicated. Interim medical history since our last visit reviewed. Allergies and medications reviewed and updated.  Review of Systems  Constitutional:  Negative for activity change, appetite change, diaphoresis, fatigue and fever.  Respiratory:  Negative for cough, chest tightness and shortness of breath.   Cardiovascular:  Negative for chest pain, palpitations and leg swelling.  Gastrointestinal:  Negative.   Endocrine: Negative for cold intolerance, heat intolerance, polydipsia, polyphagia and polyuria.  Neurological: Negative.   Psychiatric/Behavioral: Negative.     Per HPI unless specifically indicated above     Objective:    BP 123/77   Pulse 87   Temp 98.7 F (37.1 C) (Oral)   Ht 4\' 10"  (1.473 m)   Wt 114 lb 6.4 oz (51.9 kg)   LMP 01/18/2015 (Approximate)   SpO2 98%   BMI 23.91 kg/m   Wt Readings from Last 3 Encounters:  03/27/23 114 lb 6.4 oz (51.9 kg)  02/24/23 114 lb 6.4 oz (51.9 kg)  12/12/22 112 lb 12.8 oz (51.2 kg)    Physical Exam Vitals and nursing note reviewed.  Constitutional:      General: She is awake. She is not in acute distress.    Appearance: She is well-developed and well-groomed. She is not ill-appearing or toxic-appearing.  HENT:     Head: Normocephalic.     Right Ear: Hearing and external ear normal.     Left Ear: Hearing and external ear normal.  Eyes:     General: Lids are normal.        Right eye: No discharge.        Left eye: No discharge.     Conjunctiva/sclera: Conjunctivae normal.     Pupils: Pupils are equal, round, and reactive to light.  Neck:     Thyroid: No thyromegaly.     Vascular: No carotid bruit.  Cardiovascular:     Rate and Rhythm: Normal rate and regular rhythm.     Heart sounds: Normal heart sounds. No murmur heard.    No gallop.  Pulmonary:     Effort: Pulmonary effort is normal. No accessory muscle usage or respiratory distress.     Breath sounds: Normal breath sounds.  Abdominal:     General: Bowel sounds are normal. There is no distension.     Palpations: Abdomen is soft.     Tenderness: There is no abdominal tenderness.  Musculoskeletal:     Cervical back: Normal range of motion and neck supple.     Right lower leg: No edema.     Left lower leg: No edema.  Lymphadenopathy:     Cervical: No cervical adenopathy.  Skin:    General: Skin is warm and dry.  Neurological:     Mental Status: She is  alert and oriented to person, place, and time.     Deep Tendon Reflexes: Reflexes are normal and symmetric.  Reflex Scores:      Brachioradialis reflexes are 2+ on the right side and 2+ on the left side.      Patellar reflexes are 2+ on the right side and 2+ on the left side. Psychiatric:        Attention and Perception: Attention normal.        Mood and Affect: Mood normal.        Speech: Speech normal.        Behavior: Behavior normal. Behavior is cooperative.        Thought Content: Thought content normal.    Results for orders placed or performed during the hospital encounter of 02/02/23  CBC with Differential   Collection Time: 02/02/23  7:00 PM  Result Value Ref Range   WBC 13.5 (H) 4.0 - 10.5 K/uL   RBC 4.25 3.87 - 5.11 MIL/uL   Hemoglobin 12.8 12.0 - 15.0 g/dL   HCT 36.6 44.0 - 34.7 %   MCV 91.3 80.0 - 100.0 fL   MCH 30.1 26.0 - 34.0 pg   MCHC 33.0 30.0 - 36.0 g/dL   RDW 42.5 95.6 - 38.7 %   Platelets 370 150 - 400 K/uL   nRBC 0.0 0.0 - 0.2 %   Neutrophils Relative % 83 %   Neutro Abs 11.1 (H) 1.7 - 7.7 K/uL   Lymphocytes Relative 11 %   Lymphs Abs 1.5 0.7 - 4.0 K/uL   Monocytes Relative 6 %   Monocytes Absolute 0.8 0.1 - 1.0 K/uL   Eosinophils Relative 0 %   Eosinophils Absolute 0.0 0.0 - 0.5 K/uL   Basophils Relative 0 %   Basophils Absolute 0.0 0.0 - 0.1 K/uL   Immature Granulocytes 0 %   Abs Immature Granulocytes 0.06 0.00 - 0.07 K/uL  Comprehensive metabolic panel   Collection Time: 02/02/23  7:00 PM  Result Value Ref Range   Sodium 138 135 - 145 mmol/L   Potassium 3.4 (L) 3.5 - 5.1 mmol/L   Chloride 107 98 - 111 mmol/L   CO2 20 (L) 22 - 32 mmol/L   Glucose, Bld 127 (H) 70 - 99 mg/dL   BUN 9 8 - 23 mg/dL   Creatinine, Ser 5.64 0.44 - 1.00 mg/dL   Calcium 9.0 8.9 - 33.2 mg/dL   Total Protein 7.4 6.5 - 8.1 g/dL   Albumin 4.0 3.5 - 5.0 g/dL   AST 21 15 - 41 U/L   ALT 17 0 - 44 U/L   Alkaline Phosphatase 40 38 - 126 U/L   Total Bilirubin 0.8 0.0 -  1.2 mg/dL   GFR, Estimated >95 >18 mL/min   Anion gap 11 5 - 15  TSH   Collection Time: 02/02/23  7:00 PM  Result Value Ref Range   TSH 5.172 (H) 0.350 - 4.500 uIU/mL  T4, free   Collection Time: 02/02/23  7:00 PM  Result Value Ref Range   Free T4 1.10 0.61 - 1.12 ng/dL  Troponin I (High Sensitivity)   Collection Time: 02/02/23  7:00 PM  Result Value Ref Range   Troponin I (High Sensitivity) 7 <18 ng/L      Assessment & Plan:   Problem List Items Addressed This Visit       Endocrine   Controlled type 2 diabetes mellitus without complication, without long-term current use of insulin (HCC) - Primary   Chronic, stable.  A1c 6% last check, will recheck today. Continue Metformin and adjust as needed.  Continue to check BS twice  a day. Focus on diabetic diet and regular activity. - Vaccinations up to date - Foot exam up to date.  She is to schedule eye exam. - Statin on board.  No ACE or ARB at present as he BP runs on lower side and concern for hypotension with these even at low dose + syncopal episodes at baseline.      Relevant Medications   rosuvastatin (CRESTOR) 40 MG tablet   Other Relevant Orders   Bayer DCA Hb A1c Waived   Microalbumin, Urine Waived   Diabetes mellitus treated with oral medication (HCC)   Refer to diabetes without complication plan of care.      Relevant Medications   rosuvastatin (CRESTOR) 40 MG tablet   Other Relevant Orders   Bayer DCA Hb A1c Waived   Microalbumin, Urine Waived   Comprehensive metabolic panel   Hyperlipidemia associated with type 2 diabetes mellitus (HCC)   Chronic, ongoing.  Continue statin and adjust dose as needed. Lipid panel today.      Relevant Medications   rosuvastatin (CRESTOR) 40 MG tablet   Other Relevant Orders   Bayer DCA Hb A1c Waived   Comprehensive metabolic panel   Lipid Panel w/o Chol/HDL Ratio   Hypothyroidism   Chronic, ongoing.  Continue current medication regimen and adjust as needed.  Thyroid labs  obtained today.      Relevant Orders   TSH   T4, free   Other Visit Diagnoses       Colon cancer screening       Cologuard ordered.   Relevant Orders   Cologuard        Follow up plan: Return in about 6 months (around 09/24/2023) for Annual Physical -- last 09/12/23.

## 2023-03-28 ENCOUNTER — Encounter: Payer: Self-pay | Admitting: Nurse Practitioner

## 2023-03-28 LAB — HEMOGLOBIN A1C
Est. average glucose Bld gHb Est-mCnc: 137 mg/dL
Hgb A1c MFr Bld: 6.4 % — ABNORMAL HIGH (ref 4.8–5.6)

## 2023-03-28 LAB — COMPREHENSIVE METABOLIC PANEL
ALT: 17 IU/L (ref 0–32)
AST: 22 IU/L (ref 0–40)
Albumin: 4.3 g/dL (ref 3.9–4.9)
Alkaline Phosphatase: 46 IU/L (ref 44–121)
BUN/Creatinine Ratio: 10 — ABNORMAL LOW (ref 12–28)
BUN: 7 mg/dL — ABNORMAL LOW (ref 8–27)
Bilirubin Total: 0.7 mg/dL (ref 0.0–1.2)
CO2: 23 mmol/L (ref 20–29)
Calcium: 9.3 mg/dL (ref 8.7–10.3)
Chloride: 103 mmol/L (ref 96–106)
Creatinine, Ser: 0.71 mg/dL (ref 0.57–1.00)
Globulin, Total: 2.8 g/dL (ref 1.5–4.5)
Glucose: 105 mg/dL — ABNORMAL HIGH (ref 70–99)
Potassium: 4 mmol/L (ref 3.5–5.2)
Sodium: 139 mmol/L (ref 134–144)
Total Protein: 7.1 g/dL (ref 6.0–8.5)
eGFR: 95 mL/min/{1.73_m2} (ref >59)

## 2023-03-28 LAB — T4, FREE: Free T4: 1.4 ng/dL (ref 0.82–1.77)

## 2023-03-28 LAB — LIPID PANEL W/O CHOL/HDL RATIO
Cholesterol, Total: 134 mg/dL (ref 100–199)
HDL: 60 mg/dL (ref >39)
LDL Chol Calc (NIH): 51 mg/dL (ref 0–99)
Triglycerides: 134 mg/dL (ref 0–149)
VLDL Cholesterol Cal: 23 mg/dL (ref 5–40)

## 2023-03-28 LAB — TSH: TSH: 4.14 u[IU]/mL (ref 0.450–4.500)

## 2023-03-28 NOTE — Progress Notes (Signed)
 Contacted via MyChart   Good evening Denise Proctor, your labs have returned: - A1c is 6.4%, remaining stable with Metformin once a day.  Continue this as our goal is to keep it <7%. - Kidney function, creatinine and eGFR, remains normal, as is liver function, AST and ALT.  - Lipid panel shows LDL well at goal, continue statin therapy. - Thyroid levels are normal, no Levothyroxine dose changes.  Any questions? Keep being stellar!!  Thank you for allowing me to participate in your care.  I appreciate you. Kindest regards, Margret Moat

## 2023-03-29 LAB — MICROALBUMIN / CREATININE URINE RATIO
Creatinine, Urine: 77 mg/dL
Microalb/Creat Ratio: 96 mg/g{creat} — ABNORMAL HIGH (ref 0–29)
Microalbumin, Urine: 74 ug/mL

## 2023-03-31 ENCOUNTER — Ambulatory Visit: Payer: 59 | Attending: Cardiology | Admitting: Cardiology

## 2023-03-31 ENCOUNTER — Encounter: Payer: Self-pay | Admitting: Cardiology

## 2023-03-31 VITALS — BP 98/60 | HR 84 | Ht <= 58 in | Wt 114.0 lb

## 2023-03-31 DIAGNOSIS — I447 Left bundle-branch block, unspecified: Secondary | ICD-10-CM | POA: Diagnosis not present

## 2023-03-31 DIAGNOSIS — R55 Syncope and collapse: Secondary | ICD-10-CM | POA: Diagnosis not present

## 2023-03-31 DIAGNOSIS — Z8673 Personal history of transient ischemic attack (TIA), and cerebral infarction without residual deficits: Secondary | ICD-10-CM

## 2023-03-31 NOTE — Progress Notes (Unsigned)
 Electrophysiology Office Note:   Date:  04/01/2023  ID:  Denise Proctor, DOB [birth year], MRN [id number]  Primary Cardiologist: None Electrophysiologist: Nobie Putnam, MD      History of Present Illness:   Denise Proctor is a 63 y.o. female with h/o hyperlipidemia, prediabetes, syncope with collapse who is being seen today for EP evaluation.  Discussed the use of AI scribe software for clinical note transcription with the patient, who gave verbal consent to proceed.  History of Present Illness   The patient, with a history of left bundle branch block (LBBB), presents with recurrent syncope episodes over the past two years. The patient describes these episodes as sudden and unexpected, with no warning signs or triggers. The episodes are not related to position or activity level, and there are no associated symptoms such as palpitations or heart fluttering. The patient reports about three to four episodes in the past two years, with the most recent one occurring in December. During this episode, the patient experienced a sudden loss of consciousness while sitting in a car, not driving, followed by difficulty breathing and a feeling of nausea. The patient sought medical attention but left the hospital after a long wait without a definitive diagnosis. The patient's LBBB has been stable, with no significant abnormal heart rhythms noted on previous monitors.      Review of systems complete and found to be negative unless listed in HPI.   EP Information / Studies Reviewed:    EKG is ordered today. Personal review as below.      Cardiac Monitor 03/2023:   The patient was monitored for 12 days, 4 hours.   The predominant rhythm was sinus with an average rate of 80 bpm (range 49-144 bpm).  Wide-complex rhythm frequently noted, consistent with the patient's history of left bundle branch block.   There were rare PACs and PVCs.   A single supraventricular run with aberrancy noted, lasting up to 6 beats  with a maximum rate of 122 bpm.   No sustained arrhythmia or prolonged pause was observed.   There were no patient triggered events.   Predominantly sinus rhythm with rare PACs and PVCs as well as single brief supraventricular run with aberrancy.  Echo 11/19/21:  1. Left ventricular ejection fraction, by estimation, is 60 to 65%. The  left ventricle has normal function. The left ventricle has no regional  wall motion abnormalities. Left ventricular diastolic parameters are  consistent with Grade II diastolic  dysfunction (pseudonormalization).   2. Right ventricular systolic function is normal. The right ventricular  size is normal. Tricuspid regurgitation signal is inadequate for assessing  PA pressure.   3. The mitral valve is normal in structure. Trivial mitral valve  regurgitation. No evidence of mitral stenosis.   4. The aortic valve is tricuspid. Aortic valve regurgitation is not  visualized. No aortic stenosis is present.   5. The inferior vena cava is normal in size with greater than 50%  respiratory variability, suggesting right atrial pressure of 3 mmHg.        Physical Exam:   VS:  BP 98/60 (BP Location: Left Arm, Patient Position: Sitting, Cuff Size: Normal)   Pulse 84   Ht 4\' 10"  (1.473 m)   Wt 114 lb (51.7 kg)   LMP 01/18/2015 (Approximate)   SpO2 97%   BMI 23.83 kg/m    Wt Readings from Last 3 Encounters:  03/31/23 114 lb (51.7 kg)  03/27/23 114 lb 6.4 oz (51.9 kg)  02/24/23  114 lb 6.4 oz (51.9 kg)     GEN: Well nourished, well developed in no acute distress NECK: No JVD CARDIAC: Normal rate, regular rhythm.  RESPIRATORY:  Clear to auscultation without rales, wheezing or rhonchi  ABDOMEN: Soft, non-distended EXTREMITIES:  No edema; No deformity   ASSESSMENT AND PLAN:   Denise Proctor is a 63 y.o. female with h/o hyperlipidemia, prediabetes, syncope with collapse who is being seen today for EP evaluation.    #Syncope: Recurrent syncope over the past two  years, most recently in December. Episodes occur without warning and are not positional. Cardiac evaluation, including echocardiogram and previous monitors, showed no significant abnormalities. No obvious cardiac etiology. Due to no positional nature to her symptoms, I do not feel that tilt table testing would offer significant benefit. -Discussed loop recorder implantation for recurrent syncope. Explained the device's function, longevity, and potential to differentiate cardiac from non-cardiac causes. Patient is hesitant about the loop recorder and would like to think on this further. -Should not drive for at least 6 months from last episode.   -Encouraged follow up with her neurologist for further evaluation given history of stroke and recurrent syncope. -Continue with general cardiology follow-up for syncope as well.   #Left Bundle Branch Block (LBBB): She has normal LV function on last echo. She has worn multiple outpatient cardiac monitors without any reported AV block. - Monitor cardiac function over time.  - No immediate intervention required.   #?History of stroke: Possible chronic R posterior basal ganglia infarct on CT head.  - Recommend follow-up with neurology to assess need for further testing. Brain MRI has been ordered.  Follow-up - Patient to contact clinic if she decides to proceed with loop recorder - Continue follow-up with general cardiology.      Signed, Nobie Putnam, MD

## 2023-03-31 NOTE — Patient Instructions (Signed)
 Medication Instructions:  Your physician recommends that you continue on your current medications as directed. Please refer to the Current Medication list given to you today.  *If you need a refill on your cardiac medications before your next appointment, please call your pharmacy*  Follow-Up: At [facility], you and your health needs are our priority.  As part of our continuing mission to provide you with exceptional heart care, we have created designated Provider Care Teams.  These Care Teams include your primary Cardiologist (physician) and Advanced Practice Providers (APPs -  Physician Assistants and Nurse Practitioners) who all work together to provide you with the care you need, when you need it.     Your next appointment:   As needed with Dr. Jimmey Ralph  Other Instructions Call us if you decide that you would like to proceed with a loop recorder.

## 2023-04-08 LAB — COLOGUARD: COLOGUARD: NEGATIVE

## 2023-04-09 ENCOUNTER — Ambulatory Visit: Payer: 59 | Admitting: Cardiology

## 2023-04-09 NOTE — Progress Notes (Signed)
 Contacted via MyChart   Cologuard negative. Great news!!  Repeat in 3 years.

## 2023-04-15 ENCOUNTER — Ambulatory Visit: Payer: 59 | Attending: Student | Admitting: Student

## 2023-04-15 ENCOUNTER — Encounter: Payer: Self-pay | Admitting: Student

## 2023-04-15 VITALS — BP 128/72 | HR 92 | Ht <= 58 in | Wt 113.6 lb

## 2023-04-15 DIAGNOSIS — R55 Syncope and collapse: Secondary | ICD-10-CM

## 2023-04-15 NOTE — Progress Notes (Addendum)
 Cardiology Clinic Note   Date: 04/15/2023 ID: AZHAR YOGI, DOB 1961-01-18, MRN [id number]  [facility] Providers Cardiologist:  Yvonne Kendall, MD Electrophysiologist:  Nobie Putnam, MD {  Chief Complaint   Denise Proctor is a 63 y.o. female who presents to the clinic today for follow up after testing.   Patient Profile   Denise Proctor is followed by Dr. Okey Dupre for the history outlined below.      Past medical history significant for: Chest pain. Coronary CTA 10/25/2021: Calcium score of 0 with no evidence of CAD. LBBB. Hyperlipidemia. Asthma. GERD. Hypothyroidism. Stroke. T2DM. Syncope.  Echo 11/19/2021: EF 60 to 65%.  No RWMA.  Grade II DD.  Normal RV size/function.  Trivial MR. Carotid duplex 11/19/2021: Bilateral ICA without evidence of stenosis. 14-day ZIO 02/24/2023: HR 49 to 144 bpm, average 80 bpm.  Predominantly sinus rhythm.  Wide-complex rhythm frequently noted consistent with LBBB.  Rare PACs/PVCs.  1 run of SVT with aberrancy lasting 6 beats with max rate 122 bpm.  No sustained arrhythmia or prolonged pauses.  In summary, patient was first evaluated by Dr. Okey Dupre on 10/16/2021 for syncopal events.  Patient reportedly started happening May 2023 and was likely unconscious for approximately 30 seconds.  ED evaluation was unremarkable.  Coronary CTA showed calcium score of 0, carotid ultrasound was normal, echo showed normal LV/RV function as detailed above.  Patient was evaluated in the ED on 02/02/2023 for syncopal episode while in her car at a parking lot.  She initially had confusion as to where she was when she came to.  She drove home and called EMS.  She was back to baseline at this time however.  She left without being seen.  Patient was last seen in the office by Charlsie Quest, NP on 02/24/2023 following her syncopal event.  She reported she had been in her normal state of health prior to syncopal event.  14-day ZIO showed predominantly sinus rhythm as  detailed above.  Patient was evaluated by Dr. Jimmey Ralph in EP on 03/31/2023 for recurrent syncope.  Discussed loop recorder placement for patient wanted to think it over.  She was instructed to not drive for 6 months.  She was encouraged to follow-up with neurology for further evaluation.     History of Present Illness    Today, patient is feeling well. She denies further syncopal episodes. She wonders if her episodes may be related to her BP as she feels lightheaded when her BP is low. She does not check her BP at home. She denies having any kind of prodromal symptoms prior to syncope. She has not had an MRI yet and will decide if she wants to get it if she has another episodes. Patient denies shortness of breath, dyspnea on exertion, lower extremity edema, orthopnea or PND. No chest pain, pressure, or tightness. No palpitations.       ROS: All other systems reviewed and are otherwise negative except as noted in History of Present Illness.  EKGs/Labs Reviewed       EKG is not ordered today.   03/27/2023: ALT 17; AST 22; BUN 7; Creatinine, Ser 0.71; Potassium 4.0; Sodium 139   02/02/2023: Hemoglobin 12.8; WBC 13.5   03/27/2023: TSH 4.140    Physical Exam    VS:  BP 128/72   Pulse 92   Ht 4\' 10"  (1.473 m)   Wt 113 lb 9.6 oz (51.5 kg)   LMP 01/18/2015 (Approximate)   SpO2 97%  BMI 23.74 kg/m  , BMI Body mass index is 23.74 kg/m.  Orthostatic VS for the past 24 hrs (Last 3 readings):  BP- Lying Pulse- Lying BP- Sitting Pulse- Sitting BP- Standing at 0 minutes Pulse- Standing at 0 minutes BP- Standing at 3 minutes Pulse- Standing at 3 minutes  04/15/23 1554 111/77 77 114/77 78 113/76 82 109/73 82     GEN: Well nourished, well developed, in no acute distress. Neck: No JVD or carotid bruits. Cardiac:  RRR. No murmurs. No rubs or gallops.   Respiratory:  Respirations regular and unlabored. Clear to auscultation without rales, wheezing or rhonchi. GI: Soft, nontender,  nondistended. Extremities: Radials/DP/PT 2+ and equal bilaterally. No clubbing or cyanosis. No edema.  Skin: Warm and dry, no rash. Neuro: Strength intact.  Assessment & Plan   Syncope Echo October 2023 showed EF 60 to 65%, no RWMA, Grade II DD, normal RV size/function, trivial MR.  Carotid ultrasound October 2023 showed bilateral ICA without stenosis.  14-day ZIO January 2025 showed HR 49 244 bpm, average 80 bpm, predominantly sinus rhythm with, wide-complex rhythm frequently noted consistent with LBBB, rare PACs/PVCs, 1 run of SVT with aberrancy lasting 6 beats with max rate 122 bpm, no sustained arrhythmia or prolonged pauses.  She was evaluated by EP and loop recorder was recommended but patient wanted to defer.  Patient reports no further syncopal episodes. She wonders if her BP is the cause of her episodes. Orthostatic vitals were negative today.  -Encouraged follow-up with neurology. -No further workup recommended at this time.  Disposition: Return in one year or sooner as needed.          Signed, Etta Grandchild. Aleyah Balik, DNP, NP-C

## 2023-04-15 NOTE — Patient Instructions (Signed)
 Medication Instructions:  Your Physician recommend you continue on your current medication as directed.    *If you need a refill on your cardiac medications before your next appointment, please call your pharmacy*   Lab Work: None ordered at this time    Follow-Up: At [facility], you and your health needs are our priority.  As part of our continuing mission to provide you with exceptional heart care, we have created designated Provider Care Teams.  These Care Teams include your primary Cardiologist (physician) and Advanced Practice Providers (APPs -  Physician Assistants and Nurse Practitioners) who all work together to provide you with the care you need, when you need it.   Your next appointment:   As needed  Provider:   You may see Yvonne Kendall, MD or one of the following Advanced Practice Providers on your designated Care Team:   Nicolasa Ducking, NP Eula Listen, PA-C Cadence Fransico Michael, PA-C Charlsie Quest, NP Carlos Levering, NP

## 2023-05-12 ENCOUNTER — Other Ambulatory Visit: Payer: Self-pay | Admitting: Physician Assistant

## 2023-05-12 DIAGNOSIS — Z8673 Personal history of transient ischemic attack (TIA), and cerebral infarction without residual deficits: Secondary | ICD-10-CM

## 2023-05-12 DIAGNOSIS — R569 Unspecified convulsions: Secondary | ICD-10-CM

## 2023-05-15 LAB — HM DIABETES EYE EXAM

## 2023-05-20 ENCOUNTER — Encounter: Payer: Self-pay | Admitting: Physician Assistant

## 2023-06-01 ENCOUNTER — Ambulatory Visit
Admission: RE | Admit: 2023-06-01 | Discharge: 2023-06-01 | Disposition: A | Discharge status: Home / Self Care | Source: Ambulatory Visit | Attending: Physician Assistant | Admitting: Physician Assistant

## 2023-06-01 DIAGNOSIS — R569 Unspecified convulsions: Secondary | ICD-10-CM

## 2023-06-01 DIAGNOSIS — Z8673 Personal history of transient ischemic attack (TIA), and cerebral infarction without residual deficits: Secondary | ICD-10-CM

## 2023-06-01 MED ORDER — GADOPICLENOL 0.5 MMOL/ML IV SOLN
7.5000 mL | Freq: Once | INTRAVENOUS | Status: AC | PRN
  Administered 2023-06-01: 7.5 mL via INTRAVENOUS

## 2023-06-25 ENCOUNTER — Other Ambulatory Visit: Payer: Self-pay | Admitting: Nurse Practitioner

## 2023-06-26 NOTE — Telephone Encounter (Signed)
 Requested Prescriptions  Pending Prescriptions Disp Refills   fluticasone  furoate-vilanterol (BREO ELLIPTA ) 100-25 MCG/ACT AEPB [Pharmacy Med Name: BREO ELLIPTA  100-25MCG ORAL INH(30)] 60 each 3    Sig: INHALE 1 PUFF INTO THE LUNGS DAILY     Pulmonology:  Combination Products Passed - 06/26/2023  5:54 PM      Passed - Valid encounter within last 12 months    Recent Outpatient Visits           3 months ago Controlled type 2 diabetes mellitus without complication, without long-term current use of insulin (HCC)   [place] [facility] [place], Lavelle Posey, NP       Future Appointments             In 3 months Cannady, Jolene T, NP [place] [employer], [facility]

## 2023-09-29 ENCOUNTER — Ambulatory Visit: Payer: 59 | Admitting: Nurse Practitioner

## 2023-10-25 DIAGNOSIS — R569 Unspecified convulsions: Secondary | ICD-10-CM | POA: Insufficient documentation

## 2023-10-25 NOTE — Patient Instructions (Incomplete)
 Please call to schedule your mammogram and/or bone density: [facility]  Address: [address], [place], [place] [address] Phone: [phone]  [facility] [address] [place],  [place]  [address] Phone: [phone]    Be Involved in Caring For Your Health:  Taking Medications When medications are taken as directed, they can greatly improve your health. But if they are not taken as prescribed, they may not work. In some cases, not taking them correctly can be harmful. To help ensure your treatment remains effective and safe, understand your medications and how to take them. Bring your medications to each visit for review by your provider.  Your lab results, notes, and after visit summary will be available on My Chart. We strongly encourage you to use this feature. If lab results are abnormal the clinic will contact you with the appropriate steps. If the clinic does not contact you assume the results are satisfactory. You can always view your results on My Chart. If you have questions regarding your health or results, please contact the clinic during office hours. You can also ask questions on My Chart.  We at [facility] are grateful that you chose us  to provide your care. We strive to provide evidence-based and compassionate care and are always looking for feedback. If you get a survey from the clinic please complete this so we can hear your opinions.  Diabetes Mellitus and Exercise Regular exercise is important for your health, especially if you have diabetes mellitus. Exercise is not just about losing weight. It can also help you increase muscle strength and bone density and reduce body fat and stress. This can help your level of endurance and make you more fit and flexible. Why should I exercise if I have diabetes? Exercise has many benefits for people with diabetes. It can: Help lower and  control your blood sugar (glucose). Help your body respond better and become more sensitive to the hormone insulin. Reduce how much insulin your body needs. Lower your risk for heart disease by: Lowering how much bad cholesterol and triglycerides you have in your body. Increasing how much good cholesterol you have in your body. Lowering your blood pressure. Lowering your blood glucose levels. What is my activity plan? Your health care provider or an expert trained in diabetes care (certified diabetes educator) can help you make an activity plan. This plan can help you find the type of exercise that works for you. It may also tell you how often to exercise and for how long. Be sure to: Get at least 150 minutes of medium-intensity or high-intensity exercise each week. This may involve brisk walking, biking, or water aerobics. Do stretching and strengthening exercises at least 2 times a week. This may involve yoga or weight lifting. Spread out your activity over at least 3 days of the week. Get some form of physical activity each day. Do not go more than 2 days in a row without some kind of activity. Avoid being inactive for more than 30 minutes at a time. Take frequent breaks to walk or stretch. Choose activities that you enjoy. Set goals that you know you can accomplish. Start slowly and increase the intensity of your exercise over time. How do I manage my diabetes during exercise?  Monitor your blood glucose Check your blood glucose before and after you exercise. If your blood glucose is 240 mg/dL (86.6 mmol/L) or higher before you  exercise, check your urine for ketones. These are chemicals created by the liver. If you have ketones in your urine, do not exercise until your blood glucose returns to normal. If your blood glucose is 100 mg/dL (5.6 mmol/L) or lower, eat a snack that has 15-20 grams of carbohydrate in it. Check your blood glucose 15 minutes after the snack to make sure that  your level is above 100 mg/dL (5.6 mmol/L) before you start to exercise. Your risk for low blood glucose (hypoglycemia) goes up during and after exercise. Know the symptoms of this condition and how to treat it. Follow these instructions at home: Keep a carbohydrate snack on hand for use before, during, and after exercise. This can help prevent or treat hypoglycemia. Avoid injecting insulin into parts of your body that are going to be used during exercise. This may include: Your arms, when you are going to play tennis. Your legs, when you are about to go jogging. Keep track of your exercise habits. This can help you and your health care provider watch and adjust your activity plan. Write down: What you eat before and after you exercise. Blood glucose levels before and after you exercise. The type and amount of exercise you do. Talk to your health care provider before you start a new activity. They may need to: Make sure that the activity is safe for you. Adjust your insulin, other medicines, and food that you eat. Drink water while you exercise. This can stop you from losing too much water (dehydration). It can also prevent problems caused by having a lot of heat in your body (heat stroke). Where to find more information American Diabetes Association: diabetes.org Association of Diabetes Care & Education Specialists: diabeteseducator.org This information is not intended to replace advice given to you by your health care provider. Make sure you discuss any questions you have with your health care provider. Document Revised: 07/10/2021 Document Reviewed: 07/10/2021 Elsevier Patient Education  2024 [employer].

## 2023-10-27 ENCOUNTER — Encounter: Payer: Self-pay | Admitting: Nurse Practitioner

## 2023-10-27 ENCOUNTER — Ambulatory Visit: Admitting: Nurse Practitioner

## 2023-10-27 VITALS — BP 115/80 | HR 87 | Temp 98.2°F | Resp 16 | Ht <= 58 in | Wt 117.4 lb

## 2023-10-27 DIAGNOSIS — Z23 Encounter for immunization: Secondary | ICD-10-CM

## 2023-10-27 DIAGNOSIS — E1169 Type 2 diabetes mellitus with other specified complication: Secondary | ICD-10-CM | POA: Diagnosis not present

## 2023-10-27 DIAGNOSIS — J45909 Unspecified asthma, uncomplicated: Secondary | ICD-10-CM

## 2023-10-27 DIAGNOSIS — Z0000 Encounter for general adult medical examination without abnormal findings: Secondary | ICD-10-CM

## 2023-10-27 DIAGNOSIS — E119 Type 2 diabetes mellitus without complications: Secondary | ICD-10-CM | POA: Diagnosis not present

## 2023-10-27 DIAGNOSIS — E039 Hypothyroidism, unspecified: Secondary | ICD-10-CM | POA: Diagnosis not present

## 2023-10-27 DIAGNOSIS — R911 Solitary pulmonary nodule: Secondary | ICD-10-CM

## 2023-10-27 DIAGNOSIS — R569 Unspecified convulsions: Secondary | ICD-10-CM

## 2023-10-27 DIAGNOSIS — Z780 Asymptomatic menopausal state: Secondary | ICD-10-CM

## 2023-10-27 DIAGNOSIS — K2289 Other specified disease of esophagus: Secondary | ICD-10-CM

## 2023-10-27 DIAGNOSIS — E785 Hyperlipidemia, unspecified: Secondary | ICD-10-CM

## 2023-10-27 DIAGNOSIS — Z7984 Long term (current) use of oral hypoglycemic drugs: Secondary | ICD-10-CM

## 2023-10-27 LAB — BAYER DCA HB A1C WAIVED: HB A1C (BAYER DCA - WAIVED): 6 % — ABNORMAL HIGH (ref 4.8–5.6)

## 2023-10-27 MED ORDER — MONTELUKAST SODIUM 10 MG PO TABS: 10.0000 mg | ORAL_TABLET | Freq: Every day | ORAL | 3 refills | Status: AC

## 2023-10-27 MED ORDER — ROSUVASTATIN CALCIUM 40 MG PO TABS: 40.0000 mg | ORAL_TABLET | Freq: Every day | ORAL | 4 refills | Status: AC

## 2023-10-27 MED ORDER — METFORMIN HCL 500 MG PO TABS: 500.0000 mg | ORAL_TABLET | Freq: Every day | ORAL | 3 refills | Status: AC

## 2023-10-27 MED ORDER — OMEPRAZOLE 20 MG PO CPDR: 20.0000 mg | DELAYED_RELEASE_CAPSULE | Freq: Every day | ORAL | 4 refills | Status: AC

## 2023-10-27 MED ORDER — FLUTICASONE FUROATE-VILANTEROL 100-25 MCG/ACT IN AEPB: 1.0000 | INHALATION_SPRAY | Freq: Every day | RESPIRATORY_TRACT | 5 refills | Status: AC

## 2023-10-27 NOTE — Assessment & Plan Note (Signed)
Chronic, ongoing.  Continue statin and adjust dose as needed. Lipid panel today.

## 2023-10-27 NOTE — Assessment & Plan Note (Signed)
 Chronic, ongoing.  Continue current medication regimen and adjust as needed. Thyroid labs obtained today.

## 2023-10-27 NOTE — Assessment & Plan Note (Signed)
 10/17/22 scan last Grossly stable 7 mm nodule is noted in right middle lobe.  Due for 12 month recheck, ordered today and discussed with patient.

## 2023-10-27 NOTE — Assessment & Plan Note (Signed)
 Ongoing and stable at present, no seizure activity since May 2025.  Continue collaboration with neurology and current medication regimen as ordered by them.  Recent notes reviewed.

## 2023-10-27 NOTE — Assessment & Plan Note (Signed)
 Chronic, stable.  A1c 6% today. Continue Metformin  and adjust as needed.  Continue to check BS twice a day. Focus on diabetic diet and regular activity. - Vaccinations up to date -- will get flu and PCV20 at pharmacy - Foot exam up to date.  Eye exam up todate - Statin on board.  No ACE or ARB at present as he BP runs on lower side and concern for hypotension with these even at low dose + syncopal episodes at baseline.

## 2023-10-27 NOTE — Assessment & Plan Note (Signed)
 Chronic, ongoing.  FEV1 77% and FEV1/FVC 64% (January 2024).  Continues to use Breo and Albuterol .  Educated her on this.  If too costly could try Advair Diskus.  Singulair  for allergies.

## 2023-10-27 NOTE — Assessment & Plan Note (Signed)
 Chronic, stable.  Followed by GI, continue this collaboration.  Recommend she schedule follow-up with them. May level annually.

## 2023-10-27 NOTE — Progress Notes (Signed)
 BP 115/80 (BP Location: Left Arm, Patient Position: Sitting, Cuff Size: Normal)   Pulse 87   Temp 98.2 F (36.8 C) (Oral)   Resp 16   Ht 4' 9.99 (1.473 m)   Wt 117 lb 6.4 oz (53.3 kg)   LMP 01/18/2015 (Approximate)   SpO2 96%   BMI 24.54 kg/m    Subjective:    Patient ID: Denise Proctor, female    DOB: [birth year], 63 y.o.   MRN: [id number]  HPI: Denise Proctor is a 63 y.o. female  Chief Complaint  Patient presents with   Follow-up   Seizures    Had a seizures after recent med increase then switched to Kepra. Taken off Kepra and switched to Depakote (6-7 weeks on so far). Burning eyes is her concern. Arm ache as well.    DIABETES Started on 09/12/22 after she had two separate A1c readings of 6.5%.  Takes Metformin  500 MG daily, she endorses taking this often only if sugars are high. Hypoglycemic episodes:no Polydipsia/polyuria: no Visual disturbance: no Chest pain: no Paresthesias: no Glucose Monitoring: yes  Accucheck frequency: daily  Fasting glucose: 120 to 140 depending on the time of day  Post prandial:  Evening:   Before meals: Taking Insulin?: no  Long acting insulin:  Short acting insulin: Blood Pressure Monitoring: not checking Retinal Examination: Up To Date -- [place] Foot Exam: Up to Date Diabetic Education: Not Completed Pneumovax: Up to Date Influenza: Up to Date Aspirin : yes   HYPERLIPIDEMIA & SEIZURE DISORDER Continues Rosuvastatin  daily.  Cardiology visit last on 04/15/23.  Last saw neurology on 09/16/23, to take Depakote 250 MG BID.  Keppra caused many side effects. No recent seizures, last was May 2nd, 2025. Hyperlipidemia status: good compliance Satisfied with current treatment?  yes Side effects:  no Medication compliance: good compliance Supplements: none Aspirin :  yes The 10-year ASCVD risk score (Arnett DK, et al., 2019) is: 6.7%   Values used to calculate the score:     Age: 29 years     Clincally relevant sex: Female     Is  Non-Hispanic African American: No     Diabetic: Yes     Tobacco smoker: No     Systolic Blood Pressure: 115 mmHg     Is BP treated: No     HDL Cholesterol: 60 mg/dL     Total Cholesterol: 134 mg/dL Chest pain:  no Coronary artery disease:  no Family history CAD:  yes Family history early CAD:  no   ASTHMA Using Breo and Albuterol . Has been out of [place], pharmacy not filling.  Been using Albuterol  only for 1-2 months. 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 Lung nodule noted on past CT with recommendation for repeat this year. Asthma status: stable Satisfied with current treatment?: yes Albuterol /rescue inhaler frequency: twice a day Dyspnea frequency: no Wheezing frequency:no Cough frequency: increased due to no Breo on board Nocturnal symptom frequency: no Limitation of activity: no Current upper respiratory symptoms: no Triggers: pollen Home peak flows: none Last Spirometry: 02/12/22 Aerochamber/spacer use: no Visits to ER or Urgent Care in past year: no Pneumovax: will get at pharmacy Influenza: will get at pharmacy   HYPOTHYROIDISM Takes Levothyroxine  50 MCG daily. Thyroid  control status:stable Satisfied with current treatment? yes Medication side effects: no Medication compliance: good compliance Etiology of hypothyroidism: unknown Recent dose adjustment:no Fatigue: no Cold intolerance: always cold Heat intolerance: no Weight gain: no Weight loss: no Constipation: yes Diarrhea/loose stools: no Palpitations: no Lower extremity  edema: no Anxiety/depressed mood: no    10/27/2023    3:33 PM 03/27/2023    2:06 PM 12/12/2022    1:49 PM 09/12/2022    2:13 PM 05/16/2022    1:54 PM  Depression screen PHQ 2/9  Decreased Interest 1 1 1 1 1   Down, Depressed, Hopeless 0 0 0 1 0  PHQ - 2 Score 1 1 1 2 1   Altered sleeping 1 1 1  0 0  Tired, decreased energy 1 2 1 1 1   Change in appetite 1 0 0 0 0  Feeling bad or failure about yourself  0 0 0 0 0  Trouble  concentrating 0 0 1 0 1  Moving slowly or fidgety/restless 0 1 0 0 1  Suicidal thoughts 0 0 0 0 0  PHQ-9 Score 4 5 4 3 4   Difficult doing work/chores  Not difficult at all Not difficult at all Not difficult at all        10/27/2023    3:33 PM 03/27/2023    2:06 PM 12/12/2022    1:49 PM 09/12/2022    2:13 PM  GAD 7 : Generalized Anxiety Score  Nervous, Anxious, on Edge 1 1 1 1   Control/stop worrying 0 0 1 0  Worry too much - different things 0 1 1 1   Trouble relaxing 1 0 0 0  Restless 0 0 0 0  Easily annoyed or irritable 1 2 1 1   Afraid - awful might happen 0 0 1 0  Total GAD 7 Score 3 4 5 3   Anxiety Difficulty  Not difficult at all Not difficult at all Not difficult at all   Relevant past medical, surgical, family and social history reviewed and updated as indicated. Interim medical history since our last visit reviewed. Allergies and medications reviewed and updated.  Review of Systems  Constitutional:  Negative for activity change, appetite change, diaphoresis, fatigue and fever.  Respiratory:  Negative for cough, chest tightness and shortness of breath.   Cardiovascular:  Negative for chest pain, palpitations and leg swelling.  Gastrointestinal: Negative.   Endocrine: Negative for cold intolerance, heat intolerance, polydipsia, polyphagia and polyuria.  Neurological: Negative.   Psychiatric/Behavioral: Negative.     Per HPI unless specifically indicated above     Objective:    BP 115/80 (BP Location: Left Arm, Patient Position: Sitting, Cuff Size: Normal)   Pulse 87   Temp 98.2 F (36.8 C) (Oral)   Resp 16   Ht 4' 9.99 (1.473 m)   Wt 117 lb 6.4 oz (53.3 kg)   LMP 01/18/2015 (Approximate)   SpO2 96%   BMI 24.54 kg/m   Wt Readings from Last 3 Encounters:  10/27/23 117 lb 6.4 oz (53.3 kg)  04/15/23 113 lb 9.6 oz (51.5 kg)  03/31/23 114 lb (51.7 kg)    Physical Exam Vitals and nursing note reviewed.  Constitutional:      General: She is awake. She is not in acute  distress.    Appearance: She is well-developed and well-groomed. She is not ill-appearing or toxic-appearing.  HENT:     Head: Normocephalic.     Right Ear: Hearing and external ear normal.     Left Ear: Hearing and external ear normal.  Eyes:     General: Lids are normal.        Right eye: No discharge.        Left eye: No discharge.     Conjunctiva/sclera: Conjunctivae normal.     Pupils: Pupils  are equal, round, and reactive to light.  Neck:     Thyroid : No thyromegaly.     Vascular: No carotid bruit.  Cardiovascular:     Rate and Rhythm: Normal rate and regular rhythm.     Heart sounds: Normal heart sounds. No murmur heard.    No gallop.  Pulmonary:     Effort: Pulmonary effort is normal. No accessory muscle usage or respiratory distress.     Breath sounds: Normal breath sounds.  Abdominal:     General: Bowel sounds are normal. There is no distension.     Palpations: Abdomen is soft.     Tenderness: There is no abdominal tenderness.  Musculoskeletal:     Cervical back: Normal range of motion and neck supple.     Right lower leg: No edema.     Left lower leg: No edema.  Lymphadenopathy:     Cervical: No cervical adenopathy.  Skin:    General: Skin is warm and dry.  Neurological:     Mental Status: She is alert and oriented to person, place, and time.     Cranial Nerves: Cranial nerves 2-12 are intact.     Coordination: Coordination is intact.     Gait: Gait is intact.     Deep Tendon Reflexes: Reflexes are normal and symmetric.     Reflex Scores:      Brachioradialis reflexes are 2+ on the right side and 2+ on the left side.      Patellar reflexes are 2+ on the right side and 2+ on the left side. Psychiatric:        Attention and Perception: Attention normal.        Mood and Affect: Mood normal.        Speech: Speech normal.        Behavior: Behavior normal. Behavior is cooperative.        Thought Content: Thought content normal.    Results for orders placed or  performed in visit on 03/27/23  Comprehensive metabolic panel   Collection Time: 03/27/23  2:11 PM  Result Value Ref Range   Glucose 105 (H) 70 - 99 mg/dL   BUN 7 (L) 8 - 27 mg/dL   Creatinine, Ser 9.28 0.57 - 1.00 mg/dL   eGFR 95 >40 fO/fpw/8.26   BUN/Creatinine Ratio 10 (L) 12 - 28   Sodium 139 134 - 144 mmol/L   Potassium 4.0 3.5 - 5.2 mmol/L   Chloride 103 96 - 106 mmol/L   CO2 23 20 - 29 mmol/L   Calcium  9.3 8.7 - 10.3 mg/dL   Total Protein 7.1 6.0 - 8.5 g/dL   Albumin 4.3 3.9 - 4.9 g/dL   Globulin, Total 2.8 1.5 - 4.5 g/dL   Bilirubin Total 0.7 0.0 - 1.2 mg/dL   Alkaline Phosphatase 46 44 - 121 IU/L   AST 22 0 - 40 IU/L   ALT 17 0 - 32 IU/L  Lipid Panel w/o Chol/HDL Ratio   Collection Time: 03/27/23  2:11 PM  Result Value Ref Range   Cholesterol, Total 134 100 - 199 mg/dL   Triglycerides 865 0 - 149 mg/dL   HDL 60 >60 mg/dL   VLDL Cholesterol Cal 23 5 - 40 mg/dL   LDL Chol Calc (NIH) 51 0 - 99 mg/dL  TSH   Collection Time: 03/27/23  2:11 PM  Result Value Ref Range   TSH 4.140 0.450 - 4.500 uIU/mL  T4, free   Collection Time: 03/27/23  2:11 PM  Result Value Ref Range   Free T4 1.40 0.82 - 1.77 ng/dL  HgB J8r   Collection Time: 03/27/23  4:18 PM  Result Value Ref Range   Hgb A1c MFr Bld 6.4 (H) 4.8 - 5.6 %   Est. average glucose Bld gHb Est-mCnc 137 mg/dL  Urine Microalbumin w/creat. ratio   Collection Time: 03/27/23  4:18 PM  Result Value Ref Range   Creatinine, Urine 77.0 Not Estab. mg/dL   Microalbumin, Urine 25.9 Not Estab. ug/mL   Microalb/Creat Ratio 96 (H) 0 - 29 mg/g creat  Cologuard   Collection Time: 04/03/23  6:33 AM  Result Value Ref Range   COLOGUARD Negative Negative      Assessment & Plan:   Problem List Items Addressed This Visit       Respiratory   Lung nodule   10/17/22 scan last Grossly stable 7 mm nodule is noted in right middle lobe.  Due for 12 month recheck, ordered today and discussed with patient.      Relevant Orders   CT  Chest Wo Contrast   Asthma   Chronic, ongoing.  FEV1 77% and FEV1/FVC 64% (January 2024).  Continues to use Breo and Albuterol .  Educated her on this.  If too costly could try Advair Diskus.  Singulair  for allergies.      Relevant Medications   fluticasone  furoate-vilanterol (BREO ELLIPTA ) 100-25 MCG/ACT AEPB   montelukast  (SINGULAIR ) 10 MG tablet   Other Relevant Orders   CBC with Differential/Platelet     Digestive   Feline esophagus   Chronic, stable.  Followed by GI, continue this collaboration.  Recommend she schedule follow-up with them. May level annually.      Relevant Orders   Magnesium     Endocrine   Hypothyroidism   Chronic, ongoing.  Continue current medication regimen and adjust as needed.  Thyroid  labs obtained today.      Relevant Orders   TSH   T4, free   Hyperlipidemia associated with type 2 diabetes mellitus (HCC)   Chronic, ongoing.  Continue statin and adjust dose as needed. Lipid panel today.      Relevant Medications   metFORMIN  (GLUCOPHAGE ) 500 MG tablet   rosuvastatin  (CRESTOR ) 40 MG tablet   Other Relevant Orders   Bayer DCA Hb A1c Waived   Comprehensive metabolic panel with GFR   Lipid Panel w/o Chol/HDL Ratio   Diabetes mellitus treated with oral medication (HCC)   Refer to diabetes without complication plan of care.      Relevant Medications   metFORMIN  (GLUCOPHAGE ) 500 MG tablet   rosuvastatin  (CRESTOR ) 40 MG tablet   Other Relevant Orders   Bayer DCA Hb A1c Waived   Controlled type 2 diabetes mellitus without complication, without long-term current use of insulin (HCC)   Chronic, stable.  A1c 6% today. Continue Metformin  and adjust as needed.  Continue to check BS twice a day. Focus on diabetic diet and regular activity. - Vaccinations up to date -- will get flu and PCV20 at pharmacy - Foot exam up to date.  Eye exam up todate - Statin on board.  No ACE or ARB at present as he BP runs on lower side and concern for hypotension with  these even at low dose + syncopal episodes at baseline.      Relevant Medications   metFORMIN  (GLUCOPHAGE ) 500 MG tablet   rosuvastatin  (CRESTOR ) 40 MG tablet   Other Relevant Orders   Bayer DCA Hb A1c Waived  Other   Seizure-like activity (HCC) - Primary   Ongoing and stable at present, no seizure activity since May 2025.  Continue collaboration with neurology and current medication regimen as ordered by them.  Recent notes reviewed.      Other Visit Diagnoses       Postmenopausal estrogen deficiency       DEXA ordered nad instructed how to schedule + Vitamin D  check.   Relevant Orders   VITAMIN D  25 Hydroxy (Vit-D Deficiency, Fractures)   DG Bone Density     Encounter for annual physical exam       Annual physical today with labs and health maintenance reviewed, discussed with patient.        Follow up plan: Return in about 6 months (around 04/25/2024) for T2DM, HLD, SEIZURES.

## 2023-10-27 NOTE — Assessment & Plan Note (Signed)
 Refer to diabetes without complication plan of care.

## 2023-10-28 ENCOUNTER — Ambulatory Visit: Payer: Self-pay | Admitting: Nurse Practitioner

## 2023-10-28 LAB — CBC WITH DIFFERENTIAL/PLATELET
Basophils Absolute: 0 x10E3/uL (ref 0.0–0.2)
Basos: 0 %
EOS (ABSOLUTE): 0.1 x10E3/uL (ref 0.0–0.4)
Eos: 1 %
Hematocrit: 40.2 % (ref 34.0–46.6)
Hemoglobin: 13.1 g/dL (ref 11.1–15.9)
Immature Grans (Abs): 0 x10E3/uL (ref 0.0–0.1)
Immature Granulocytes: 0 %
Lymphocytes Absolute: 3.1 x10E3/uL (ref 0.7–3.1)
Lymphs: 44 %
MCH: 30.7 pg (ref 26.6–33.0)
MCHC: 32.6 g/dL (ref 31.5–35.7)
MCV: 94 fL (ref 79–97)
Monocytes Absolute: 0.6 x10E3/uL (ref 0.1–0.9)
Monocytes: 8 %
Neutrophils Absolute: 3.2 x10E3/uL (ref 1.4–7.0)
Neutrophils: 47 %
Platelets: 351 x10E3/uL (ref 150–450)
RBC: 4.27 x10E6/uL (ref 3.77–5.28)
RDW: 13.5 % (ref 11.7–15.4)
WBC: 6.9 x10E3/uL (ref 3.4–10.8)

## 2023-10-28 LAB — COMPREHENSIVE METABOLIC PANEL WITH GFR
ALT: 13 IU/L (ref 0–32)
AST: 16 IU/L (ref 0–40)
Albumin: 4.5 g/dL (ref 3.9–4.9)
Alkaline Phosphatase: 38 IU/L — ABNORMAL LOW (ref 49–135)
BUN/Creatinine Ratio: 15 (ref 12–28)
BUN: 10 mg/dL (ref 8–27)
Bilirubin Total: 0.4 mg/dL (ref 0.0–1.2)
CO2: 26 mmol/L (ref 20–29)
Calcium: 9.2 mg/dL (ref 8.7–10.3)
Chloride: 101 mmol/L (ref 96–106)
Creatinine, Ser: 0.66 mg/dL (ref 0.57–1.00)
Globulin, Total: 2.4 g/dL (ref 1.5–4.5)
Glucose: 107 mg/dL — ABNORMAL HIGH (ref 70–99)
Potassium: 4.3 mmol/L (ref 3.5–5.2)
Sodium: 140 mmol/L (ref 134–144)
Total Protein: 6.9 g/dL (ref 6.0–8.5)
eGFR: 97 mL/min/1.73 (ref >59)

## 2023-10-28 LAB — LIPID PANEL W/O CHOL/HDL RATIO
Cholesterol, Total: 149 mg/dL (ref 100–199)
HDL: 59 mg/dL (ref >39)
LDL Chol Calc (NIH): 73 mg/dL (ref 0–99)
Triglycerides: 88 mg/dL (ref 0–149)
VLDL Cholesterol Cal: 17 mg/dL (ref 5–40)

## 2023-10-28 LAB — MAGNESIUM: Magnesium: 2 mg/dL (ref 1.6–2.3)

## 2023-10-28 LAB — TSH: TSH: 4.35 u[IU]/mL (ref 0.450–4.500)

## 2023-10-28 LAB — VITAMIN D 25 HYDROXY (VIT D DEFICIENCY, FRACTURES): Vit D, 25-Hydroxy: 26 ng/mL — ABNORMAL LOW (ref 30.0–100.0)

## 2023-10-28 LAB — T4, FREE: Free T4: 1.32 ng/dL (ref 0.82–1.77)

## 2023-11-02 ENCOUNTER — Other Ambulatory Visit: Payer: Self-pay | Admitting: Nurse Practitioner

## 2023-11-02 DIAGNOSIS — E039 Hypothyroidism, unspecified: Secondary | ICD-10-CM

## 2023-11-04 NOTE — Telephone Encounter (Signed)
 Requested Prescriptions  Pending Prescriptions Disp Refills   levothyroxine  (SYNTHROID ) 50 MCG tablet [Pharmacy Med Name: LEVOTHYROXINE  0.05MG  ( ) TAB] 90 tablet 1    Sig: TAKE 1 TABLET(50 MCG) BY MOUTH DAILY     Endocrinology:  Hypothyroid Agents Passed - 11/04/2023 12:15 PM      Passed - TSH in normal range and within 360 days    TSH  Date Value Ref Range Status  10/27/2023 4.350 0.450 - 4.500 uIU/mL Final         Passed - Valid encounter within last 12 months    Recent Outpatient Visits           1 week ago Seizure-like activity (HCC)   [place] [facility] [place], [place] T, NP   7 months ago Controlled type 2 diabetes mellitus without complication, without long-term current use of insulin (HCC)   [place] [facility] [place], Melanie DASEN, NP

## 2024-01-04 ENCOUNTER — Other Ambulatory Visit: Payer: Self-pay | Admitting: Nurse Practitioner

## 2024-01-07 NOTE — Telephone Encounter (Signed)
 Requested Prescriptions  Pending Prescriptions Disp Refills   glucose blood (ONETOUCH VERIO) test strip [Pharmacy Med Name: ONE TOUCH VERIO TEST ST(NEW)100S] 100 strip 6    Sig: USE TO CHECK BLOOD SUGAR TWICE DAILY. GOAL FOR FASTING LEVEL IS<130 AND GOAL FOR 2 HOURS AFTER MEALS IS<180     Endocrinology: Diabetes - Testing Supplies Passed - 01/07/2024  3:13 PM      Passed - Valid encounter within last 12 months    Recent Outpatient Visits           2 months ago Seizure-like activity (HCC)   [facility] [place], [place] T, NP   9 months ago Controlled type 2 diabetes mellitus without complication, without long-term current use of insulin (HCC)   [facility] [place], Melanie DASEN, NP

## 2024-04-22 ENCOUNTER — Ambulatory Visit: Admitting: Nurse Practitioner
